# Patient Record
Sex: Male | Born: 1955
Health system: Southern US, Community
[De-identification: ages and names within clinical notes are randomized; demographics above are authoritative.]

## PROBLEM LIST (undated history)

## (undated) DIAGNOSIS — I350 Nonrheumatic aortic (valve) stenosis: Secondary | ICD-10-CM

## (undated) DIAGNOSIS — Z952 Presence of prosthetic heart valve: Secondary | ICD-10-CM

## (undated) DIAGNOSIS — I10 Essential (primary) hypertension: Secondary | ICD-10-CM

## (undated) HISTORY — DX: Nonrheumatic aortic (valve) stenosis: I35.0

## (undated) HISTORY — DX: Essential (primary) hypertension: I10

## (undated) HISTORY — PX: VASECTOMY: SHX75

## (undated) HISTORY — PX: ANAL FISSURE REPAIR: SHX2312

## (undated) HISTORY — PX: OTHER SURGICAL HISTORY: SHX169

---

## 2005-03-16 ENCOUNTER — Ambulatory Visit: Payer: Self-pay | Admitting: Cardiology

## 2009-12-09 LAB — HM COLONOSCOPY

## 2010-05-04 DIAGNOSIS — Z9852 Vasectomy status: Secondary | ICD-10-CM | POA: Insufficient documentation

## 2010-05-04 DIAGNOSIS — K602 Anal fissure, unspecified: Secondary | ICD-10-CM | POA: Insufficient documentation

## 2010-05-04 DIAGNOSIS — I10 Essential (primary) hypertension: Secondary | ICD-10-CM | POA: Insufficient documentation

## 2010-07-14 ENCOUNTER — Encounter: Payer: Self-pay | Admitting: Cardiology

## 2010-07-20 ENCOUNTER — Encounter: Payer: Self-pay | Admitting: Cardiology

## 2010-08-17 ENCOUNTER — Ambulatory Visit: Payer: Self-pay | Admitting: Cardiology

## 2010-08-24 ENCOUNTER — Encounter: Payer: Self-pay | Admitting: Cardiology

## 2010-08-24 ENCOUNTER — Ambulatory Visit (INDEPENDENT_AMBULATORY_CARE_PROVIDER_SITE_OTHER): Payer: 59 | Admitting: Cardiology

## 2010-08-24 DIAGNOSIS — I359 Nonrheumatic aortic valve disorder, unspecified: Secondary | ICD-10-CM

## 2010-08-24 DIAGNOSIS — I350 Nonrheumatic aortic (valve) stenosis: Secondary | ICD-10-CM | POA: Insufficient documentation

## 2010-08-24 DIAGNOSIS — I10 Essential (primary) hypertension: Secondary | ICD-10-CM

## 2010-08-24 MED ORDER — OLMESARTAN-AMLODIPINE-HCTZ 40-5-25 MG PO TABS: 1.0000 | ORAL_TABLET | Freq: Every day | ORAL | Status: DC

## 2010-08-24 NOTE — Assessment & Plan Note (Signed)
He was recently started on a medication list above seems to be working. I will continue this but he is advised to get a blood pressure cuff and to keep a diary.

## 2010-08-24 NOTE — Assessment & Plan Note (Signed)
I suspect that he's had dilatation of his aortic root and now has at least moderate aortic insufficiency by exam. He has bounding pulses and maybe more severe. I'll start with an echocardiogram to further quantify the stenosis/insufficiency in the size of his aortic root.

## 2010-08-24 NOTE — Progress Notes (Signed)
HPI The patient presents for followup of aortic valve disease and hypertension. He has known bicuspid aortic valve disease and was last echoed in 2007. He had a TEE and transthoracic echo in 2003. However, he hasn't been seen by [place] since the last echo.  In fact he brings his wife today. They've been married 31 years. Although he was very aware of this diagnosis he never mentioned it to her. He denies any symptoms. He says he works third shift and he has a vigorous job. He denies any chest pressure, neck or arm discomfort. He has no palpitations, presyncope or syncope. He has no shortness of breath, PND or orthopnea. He has no weight gain or edema.  No Known Allergies  Current Outpatient Prescriptions  Medication Sig Dispense Refill  . Cholecalciferol (VITAMIN D3) 2000 UNITS TABS Take 1 tablet by mouth daily.        . Multiple Vitamins-Minerals (CENTRUM SILVER PO) Take 1 tablet by mouth daily.        . Olmesartan-Amlodipine-HCTZ (TRIBENZOR) 40-5-25 MG TABS Take 1 tablet by mouth daily.  30 tablet  6  . Omega-3 Fatty Acids (FISH OIL CONCENTRATE PO) Take 1 tablet by mouth daily.        . vitamin B-12 (CYANOCOBALAMIN) 1000 MCG tablet Take 1,000 mcg by mouth daily.          Past Medical History  Diagnosis Date  . HTN (hypertension)   . Aortic stenosis     Bicuspid Ao Valve (TEE 2003, TTE 2007)    Past Surgical History  Procedure Date  . Left arm surgery   . Vasectomy   . Anal fissure repair     Family History  Problem Relation Age of Onset  . Cancer Father 54    History   Social History  . Marital Status: Married    Spouse Name: N/A    Number of Children: 2  . Years of Education: N/A   Occupational History  . Mechanic    Social History Main Topics  . Smoking status: Never Smoker   . Smokeless tobacco: Never Used  . Alcohol Use: Not on file  . Drug Use: Not on file  . Sexually Active: Not on file   Other Topics Concern  . Not on file   Social History Narrative  . No  narrative on file    ROS:  Positive for nighttime leg cramps. Otherwise as stated in the HPI and negative for all other systems.   PHYSICAL EXAM BP 148/71  Pulse 80  Ht 5\' 6"  (1.676 m)  Wt 179 lb (81.194 kg)  BMI 28.89 kg/m2 GENERAL:  Well appearing HEENT:  Pupils equal round and reactive, fundi not visualized, oral mucosa unremarkable NECK:  No jugular venous distention, waveform within normal limits, carotid upstroke brisk and symmetric, no bruits, no thyromegaly LYMPHATICS:  No cervical, inguinal adenopathy LUNGS:  Clear to auscultation bilaterally BACK:  No CVA tenderness CHEST:  Unremarkable HEART:  PMI not displaced or sustained,S1 and S2 within normal limits, no S3, no S4, no clicks, no rubs, 2/6 apical systolic murmur mid peaking and out the aortic outflow tract, 3/6 diastolic murmur into and past mid diastole at the 3rd left intercostal space. ABD:  Flat, positive bowel sounds normal in frequency in pitch, no bruits, no rebound, no guarding, no midline pulsatile mass, no hepatomegaly, no splenomegaly EXT:  Pulses are bounding throughout, no edema, no cyanosis no clubbing SKIN:  No rashes no nodules NEURO:  Cranial nerves II  through XII grossly intact, motor grossly intact throughout [facility]:  Cognitively intact, oriented to person place and time   EKG:  Normal sinus rhythm, rate 69, left ventricular hypertrophy by voltage criteria  ASSESSMENT AND PLAN

## 2010-08-24 NOTE — Patient Instructions (Signed)
Your physician wants you to follow-up in: 6 months. You will receive a reminder letter in the mail one-two months in advance. If you don't receive a letter, please call our office to schedule the follow-up appointment. Your physician recommends that you continue on your current medications as directed. Please refer to the Current Medication list given to you today. Your physician has requested that you have an echocardiogram. Echocardiography is a painless test that uses sound waves to create images of your heart. It provides your doctor with information about the size and shape of your heart and how well your heart's chambers and valves are working. This procedure takes approximately one hour. There are no restrictions for this procedure. If the results of your test are normal or stable, you will receive a letter. If they are abnormal, the nurse will contact you by phone.  

## 2010-08-26 ENCOUNTER — Other Ambulatory Visit: Payer: Self-pay | Admitting: *Deleted

## 2010-08-26 ENCOUNTER — Other Ambulatory Visit (INDEPENDENT_AMBULATORY_CARE_PROVIDER_SITE_OTHER): Payer: 59 | Admitting: *Deleted

## 2010-08-26 ENCOUNTER — Other Ambulatory Visit: Payer: Self-pay | Admitting: Cardiology

## 2010-08-26 DIAGNOSIS — I359 Nonrheumatic aortic valve disorder, unspecified: Secondary | ICD-10-CM

## 2010-08-26 DIAGNOSIS — I350 Nonrheumatic aortic (valve) stenosis: Secondary | ICD-10-CM

## 2010-08-26 DIAGNOSIS — R011 Cardiac murmur, unspecified: Secondary | ICD-10-CM

## 2010-08-27 ENCOUNTER — Telehealth: Payer: Self-pay | Admitting: *Deleted

## 2010-08-27 NOTE — Telephone Encounter (Signed)
Notified results are not available at this time.

## 2010-08-31 ENCOUNTER — Telehealth: Payer: Self-pay | Admitting: *Deleted

## 2010-08-31 NOTE — Telephone Encounter (Signed)
Message copied by Arlyss Gandy on Mon Aug 31, 2010 11:20 AM ------      Message from: Rollene Rotunda      Created: Sun Aug 30, 2010 10:02 PM       Please call this patient today.  Tell him that we are still looking at his results.  I want Michelle Piper to look at it again.

## 2010-08-31 NOTE — Telephone Encounter (Signed)
Pt's wife notified.

## 2010-10-05 ENCOUNTER — Other Ambulatory Visit: Payer: Self-pay

## 2011-02-18 ENCOUNTER — Ambulatory Visit (INDEPENDENT_AMBULATORY_CARE_PROVIDER_SITE_OTHER): Payer: 59 | Admitting: Cardiology

## 2011-02-18 ENCOUNTER — Encounter: Payer: Self-pay | Admitting: Cardiology

## 2011-02-18 VITALS — BP 119/70 | HR 73 | Ht 66.0 in | Wt 189.8 lb

## 2011-02-18 DIAGNOSIS — I359 Nonrheumatic aortic valve disorder, unspecified: Secondary | ICD-10-CM

## 2011-02-18 DIAGNOSIS — Q231 Congenital insufficiency of aortic valve: Secondary | ICD-10-CM

## 2011-02-18 DIAGNOSIS — I351 Nonrheumatic aortic (valve) insufficiency: Secondary | ICD-10-CM

## 2011-02-19 ENCOUNTER — Telehealth: Payer: Self-pay | Admitting: *Deleted

## 2011-02-19 ENCOUNTER — Encounter: Payer: Self-pay | Admitting: *Deleted

## 2011-02-19 NOTE — Telephone Encounter (Signed)
Pt has [employer], no precert required

## 2011-02-19 NOTE — Telephone Encounter (Signed)
TEE scheduled for Wednesday, 1/16 at 9:00 - [facility]

## 2011-02-19 NOTE — Telephone Encounter (Signed)
TEE scheduled for Wednesday, 1/16 at 9:00 am.  Patient need to arrive at 8:00 to short stay at [facility].  Reminder:  Nothing to eat or drink after 12 midnight prior to labs.  Instructions given.  Verbalized understanding.

## 2011-02-21 ENCOUNTER — Encounter: Payer: Self-pay | Admitting: Cardiology

## 2011-02-21 DIAGNOSIS — Q231 Congenital insufficiency of aortic valve: Secondary | ICD-10-CM | POA: Insufficient documentation

## 2011-02-21 NOTE — Progress Notes (Signed)
Justin Bottoms, MD, [facility] ABIM Board Certified in Adult Cardiovascular Medicine,Internal Medicine and Critical Care Medicine    CC: followup patient with history of bicuspid aortic valve  HPI:  The patient is a 56 year old man with history of bicuspid aortic valve and aortic insufficiency previously rated at moderate.  He had a recent echocardiogram done in July which showed a possible bicuspid aortic valve with an ejection fraction of 55-60% and only mild mitral regurgitation.  However the quality of the echo was not very good and actually during his office visit I went back and reviewed together with the patient the images He does have aortic regurgitation, but it is very difficult to rate it by transthoracic echocardiogram due to the eccentric nature.  There also appears to be some prolapse of one of the cusps presumably the left coronary cusp of the aortic valve. The patient does not have any evidence of ventricular dilatation.  His ejection fraction also is within normal limits.  He is asymptomatic and isn't in much a class I.  He is very active and works very hard.  He states that last year he worked 333 days out of the year.  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  He denies any chest pain, shortness of breath, orthopnea, PND, presyncope, palpitations or other cardiovascular-related symptoms.  I discussed with him and his wife the findings of his echocardiogram from July 2012   PMH: reviewed and listed in Problem List in Electronic Records (and see below) Past Medical History  Diagnosis Date  . HTN (hypertension)   . Aortic stenosis     Bicuspid Ao Valve (TEE 2003, TTE 2007),moderate aortic insufficiency   Past Surgical History  Procedure Date  . Left arm surgery   . Vasectomy   . Anal fissure repair     Allergies/SH/FHX : available in Electronic Records for review  No Known Allergies History   Social History  . Marital Status: Married   Spouse Name: N/A    Number of Children: 2  . Years of Education: N/A   Occupational History  . Mechanic    Social History Main Topics  . Smoking status: Never Smoker   . Smokeless tobacco: Never Used  . Alcohol Use: Yes     Occasionally--socially  . Drug Use: No  . Sexually Active: Not on file   Other Topics Concern  . Not on file   Social History Narrative  . No narrative on file   Family History  Problem Relation Age of Onset  . Cancer Father 2    Medications: Current Outpatient Prescriptions  Medication Sig Dispense Refill  . Cholecalciferol (VITAMIN D3) 2000 UNITS TABS Take 1 tablet by mouth daily.        . Multiple Vitamins-Minerals (CENTRUM SILVER PO) Take 1 tablet by mouth daily.        . Olmesartan-Amlodipine-HCTZ (TRIBENZOR) 40-5-25 MG TABS Take 1 tablet by mouth daily.  30 tablet  6  . Omega-3 Fatty Acids (FISH OIL CONCENTRATE PO) Take 1 tablet by mouth daily.        Marland Kitchen pyridoxine (B-6) 100 MG tablet Take 100 mg by mouth daily.      . vitamin B-12 (CYANOCOBALAMIN) 1000 MCG tablet Take 1,000 mcg by mouth daily.          ROS: No nausea or vomiting. No fever or chills.No melena or hematochezia.No bleeding.No claudication  Physical Exam: BP 119/70  Pulse 73  Ht 5\' 6"  (1.676 m)  Wt 189 lb 12 oz (86.07 kg)  BMI 30.63 kg/m2 General:well-nourished white male in no apparent distress Neck:normal carotid upstroke no carotid bruits.  No thyromegaly no nodular thyroid.  Normal JVP Lungs:clear breath sounds bilaterally without any wheezing Cardiac:2/6 outflow murmur right upper sternal border and a 3/6 decrescendo murmur ending in late diastole with the patient leaning forward.  Otherwise, normal S1 and S2 Vascular:no edema.  Normal distal pulses.  Normal dorsalis pedis and posterior tibial pulses Skin:warm and dry Physcologic:normal affect  12lead ECG:not performed Limited bedside ECHO:N/A   Patient Active Problem List  Diagnoses  . Hypertension  . Anal  fissure  . Hx of vasectomy  . HTN (hypertension)  . Bicuspid aortic valve- probable moderate aortic insufficiency, NYHA class I.  Asymptomatic    PLAN   I reviewed the patient's echocardiogram at him and his wife.  His physical examination is adequate for portion in severity of aortic insufficiency as noted by physical exam.  The murmur of aortic insufficiency his labs although this does not relate to severity of AI I suspect the murmurs lab because is gently so eccentric.  The murmur and late in diastole suggestive of mild-to-moderate aortic insufficiency.  The patient's last TEE was in 2003, and I told them based on the physical examination and the poor quality of the transthoracic echocardiogram in evaluating his aortic insufficiency.  We will proceed with a TEE.  I discussed the risks and benefits of the procedure the patient as well as his wife.  He is willing to proceed next week.

## 2011-02-24 DIAGNOSIS — I359 Nonrheumatic aortic valve disorder, unspecified: Secondary | ICD-10-CM

## 2011-03-02 ENCOUNTER — Other Ambulatory Visit: Payer: Self-pay | Admitting: *Deleted

## 2011-03-02 DIAGNOSIS — I351 Nonrheumatic aortic (valve) insufficiency: Secondary | ICD-10-CM

## 2011-03-02 DIAGNOSIS — Q231 Congenital insufficiency of aortic valve: Secondary | ICD-10-CM

## 2011-03-03 ENCOUNTER — Other Ambulatory Visit: Payer: Self-pay | Admitting: *Deleted

## 2011-03-03 DIAGNOSIS — I351 Nonrheumatic aortic (valve) insufficiency: Secondary | ICD-10-CM

## 2011-03-06 NOTE — Progress Notes (Signed)
Result TEE [facility]  02/23/2101

## 2011-03-09 DIAGNOSIS — I351 Nonrheumatic aortic (valve) insufficiency: Secondary | ICD-10-CM | POA: Insufficient documentation

## 2011-03-10 ENCOUNTER — Institutional Professional Consult (permissible substitution) (INDEPENDENT_AMBULATORY_CARE_PROVIDER_SITE_OTHER): Payer: 59 | Admitting: Thoracic Surgery (Cardiothoracic Vascular Surgery)

## 2011-03-10 ENCOUNTER — Encounter: Payer: Self-pay | Admitting: Thoracic Surgery (Cardiothoracic Vascular Surgery)

## 2011-03-10 VITALS — BP 111/61 | HR 75 | Resp 20 | Ht 66.0 in | Wt 189.0 lb

## 2011-03-10 DIAGNOSIS — I351 Nonrheumatic aortic (valve) insufficiency: Secondary | ICD-10-CM

## 2011-03-10 DIAGNOSIS — Q231 Congenital insufficiency of aortic valve: Secondary | ICD-10-CM | POA: Insufficient documentation

## 2011-03-10 DIAGNOSIS — I359 Nonrheumatic aortic valve disorder, unspecified: Secondary | ICD-10-CM

## 2011-03-10 NOTE — Progress Notes (Signed)
[address]            [name] [address]          [phone]     CARDIOTHORACIC SURGERY CONSULTATION REPORT  PCP is Monica Becton, MD, MD Referring Provider is Lewayne Bunting, MD  Chief Complaint  Patient presents with  . 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 The patient reports that he was first noted to have a heart murmur many years ago and diagnosed with bicuspid aortic valve and aortic insufficiency approximately 10 years ago. He has been followed with serial echocardiograms since that time.  He recently was seen in followup by Dr. Andee Lineman. Because his previous echocardiogram was poor quality, he scheduled a transesophageal echocardiogram which was performed 2 weeks ago. This revealed severe aortic regurgitation. There was prolapse of aortic valve which appeared to be bicuspid and an eccentric jet of regurgitation that was quite severe. Ejection fraction was estimated 50-55%. There was mild mitral regurgitation. There is mild dilatation of the aortic root. There was mild left ventricular chamber enlargement. The patient has been referred to consider elective aortic valve repair or replacement.  Past Medical History  Diagnosis Date  . HTN (hypertension)   . Aortic stenosis     Bicuspid Ao Valve (TEE 2003, TTE 2007),moderate aortic insufficiency    Past Surgical History  Procedure Date  . Left arm surgery   . Vasectomy   . Anal fissure repair     Family History  Problem Relation Age of Onset  . Cancer Father 61    Social History History  Substance Use Topics  . Smoking status: Never Smoker   . Smokeless tobacco: Never Used  . Alcohol Use: Yes     Occasionally--socially    Current Outpatient Prescriptions  Medication  Sig Dispense Refill  . Cholecalciferol (VITAMIN D3) 2000 UNITS TABS Take 1 tablet by mouth daily.        . Multiple Vitamins-Minerals (CENTRUM SILVER PO) Take 1 tablet by mouth daily.        . Olmesartan-Amlodipine-HCTZ (TRIBENZOR) 40-5-25 MG TABS Take 1 tablet by mouth daily.  30 tablet  6  . Omega-3 Fatty Acids (FISH OIL CONCENTRATE PO) Take 1 tablet by mouth daily.        Marland Kitchen pyridoxine (B-6) 100 MG tablet Take 100 mg by mouth daily.      . vitamin B-12 (CYANOCOBALAMIN) 1000 MCG tablet Take 1,000 mcg by mouth daily.          No Known Allergies  Review of Systems:  General:  normal appetite, normal energy   Respiratory:  no cough, no wheezing, no hemoptysis, no pain with inspiration or cough, no shortness of breath   Cardiac:   no chest pain or tightness, no exertional SOB, no resting SOB, no PND, no orthopnea, no LE edema, no palpitations, no syncope  GI:   no difficulty swallowing, no hematochezia, no hematemesis, no melena, no constipation, no diarrhea   GU:   no dysuria, no urgency, no frequency   Musculoskeletal: no arthritis, no arthralgia other than some pain both ankles with ambulation   Vascular:  no pain suggestive of claudication   Neuro:  no symptoms suggestive of TIA's, no seizures, no headaches, no peripheral neuropathy   Endocrine:  Negative   HEENT:  no loose teeth or painful teeth, sees his dentist regularly,  no recent vision changes  Psych:   no anxiety, no depression    Physical Exam:   BP 111/61  Pulse 75  Resp 20  Ht 5\' 6"  (1.676 m)  Wt 189 lb (85.73 kg)  BMI 30.51 kg/m2  SpO2 95%  General:  well-appearing  HEENT:  Unremarkable   Neck:   no JVD, no bruits, no adenopathy   Chest:   clear to auscultation, symmetrical breath sounds, no wheezes, no rhonchi   CV:   RRR, grade III/VI diastolic murmur   Abdomen:  soft, non-tender, no masses   Extremities:  warm, well-perfused, pulses palpable  Rectal/GU  Deferred  Neuro:   Grossly non-focal and symmetrical  throughout  Skin:   Clean and dry, no rashes, no breakdown   Diagnostic Tests:  Transesophageal ECHO performed 02/24/2011 at [facility] and he is reviewed. This demonstrates what appears to be bicuspid aortic valve with severe prolapse and severe aortic insufficiency. There is mild left ventricular chamber enlargement but left ventricular systolic function is fairly normal with ejection fraction estimated between 50 and 55%. There is mild mitral regurgitation. No other significant abnormalities are noted.   Impression:  Patient has bicuspid native aortic valve with severe aortic insufficiency. There is mild left ventricular chamber enlargement, although chamber dimensions are probably not quite large enough to meet criteria based upon ACC guidelines. There is mild left ventricular dysfunction with ejection fraction 50-55%. The patient remains completely asymptomatic. Because of the severity of aortic insufficiency, I think it is reasonable to consider elective surgical intervention in the near future. Alternatively the patient could continue medical therapy with close followup, but the patient truly has torrential aortic insufficiency documented by TEE.  I share Dr Margarita Mail concern that his left ventricular function might deteriorate fairly quickly without surgical intervention.  Plan:  I discussed matters at length with the patient and his family here in the office today. The rationale for elective surgical intervention has been discussed and compared in contrast with continued medical therapy with close followup. Surgical alternatives have also been discussed in detail including the possibility of replacing his valve using a mechanical prosthesis, a bioprosthetic tissue valve, or possibly the Ross autograft procedure. We discussed the possibility of minimally invasive techniques for conventional aortic valve replacement and contrasted the risks and benefits to median sternotomy. The patient  seems to be interested in proceeding with surgery in the fairly near future. He will need left and right heart catheterization performed. We will also obtain cardiac gated CT angiogram of the aortic valve and aortic root to accurately size the aortic root given the presence of bicuspid valve and further characterize the functional anatomy to ascertain whether or not an attempt at valve repair might be feasible. Overall I am skeptical that his valve could be repaired based upon images from the transesophageal echocardiogram. The patient will return for further followup in 2 weeks.    Salvatore Decent. Cornelius Moras, MD 03/10/2011 6:00 PM

## 2011-03-12 ENCOUNTER — Other Ambulatory Visit: Payer: Self-pay | Admitting: Thoracic Surgery (Cardiothoracic Vascular Surgery)

## 2011-03-15 ENCOUNTER — Other Ambulatory Visit: Payer: Self-pay | Admitting: *Deleted

## 2011-03-15 DIAGNOSIS — I351 Nonrheumatic aortic (valve) insufficiency: Secondary | ICD-10-CM

## 2011-03-16 ENCOUNTER — Other Ambulatory Visit: Payer: Self-pay | Admitting: *Deleted

## 2011-03-16 DIAGNOSIS — Z01810 Encounter for preprocedural cardiovascular examination: Secondary | ICD-10-CM

## 2011-03-16 DIAGNOSIS — I351 Nonrheumatic aortic (valve) insufficiency: Secondary | ICD-10-CM

## 2011-03-17 ENCOUNTER — Other Ambulatory Visit: Payer: Self-pay | Admitting: Cardiology

## 2011-03-17 ENCOUNTER — Encounter: Payer: Self-pay | Admitting: *Deleted

## 2011-03-17 DIAGNOSIS — I351 Nonrheumatic aortic (valve) insufficiency: Secondary | ICD-10-CM

## 2011-03-17 LAB — PROTIME-INR

## 2011-03-19 ENCOUNTER — Encounter: Payer: Self-pay | Admitting: *Deleted

## 2011-03-22 ENCOUNTER — Ambulatory Visit (HOSPITAL_COMMUNITY)
Admission: RE | Admit: 2011-03-22 | Discharge: 2011-03-22 | Disposition: A | Discharge status: Home / Self Care | Payer: 59 | Source: Ambulatory Visit | Attending: Cardiology | Admitting: Cardiology

## 2011-03-22 ENCOUNTER — Ambulatory Visit (HOSPITAL_COMMUNITY): Admission: RE | Admit: 2011-03-22 | Payer: 59 | Source: Ambulatory Visit

## 2011-03-22 ENCOUNTER — Encounter: Payer: Self-pay | Admitting: Cardiology

## 2011-03-22 ENCOUNTER — Other Ambulatory Visit: Payer: Self-pay | Admitting: Cardiology

## 2011-03-22 ENCOUNTER — Encounter: Payer: Self-pay | Admitting: *Deleted

## 2011-03-22 DIAGNOSIS — I351 Nonrheumatic aortic (valve) insufficiency: Secondary | ICD-10-CM

## 2011-03-22 NOTE — H&P (Signed)
TEE report [facility] [address] [name]                     Justin Gasner De Gent, MD  02/21/2011  3:55 PM  Signed    Justin Gowens De Gent, MD, FACC ABIM Board Certified in Adult Cardiovascular Medicine,Internal Medicine and Critical Care Medicine      CC: followup patient with history of bicuspid aortic valve   HPI:   The patient is a 55-year-old man with history of bicuspid aortic valve and aortic insufficiency previously rated at moderate.  He had a recent echocardiogram done in July which showed a possible bicuspid aortic valve with an ejection fraction of 55-60% and only mild mitral regurgitation.  However the quality of the echo was not very good and actually during his office visit I went back and reviewed together with the patient the images He does have aortic regurgitation, but it is very difficult to rate it by transthoracic echocardiogram due to the eccentric nature.  There also appears to be some prolapse of one of the cusps presumably the left coronary cusp of the aortic valve. The patient does not have any evidence of ventricular dilatation.  His ejection fraction also is within normal limits.  He is asymptomatic and isn't in much a class I.  He is very active and works very hard.  He states that last year he worked 333 days out of the year.  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  He denies any chest pain, shortness of breath, orthopnea, PND, presyncope, palpitations or other cardiovascular-related symptoms.  I discussed with him and his wife the findings of his echocardiogram from July 2012     PMH: reviewed and listed in Problem List in Electronic Records (and see below) Past Medical History   Diagnosis  Date   .  HTN (hypertension)     .  Aortic stenosis         Bicuspid Ao Valve (TEE 2003, TTE 2007),moderate aortic insufficiency    Past Surgical History   Procedure  Date   .  Left arm surgery     .  Vasectomy     .  Anal fissure repair         Allergies/SH/FHX : available in Electronic Records for review   No Known Allergies History       Social History   .  Marital Status:  Married       Spouse Name:  N/A       Number of Children:  2   .  Years of Education:  N/A       Occupational History   .  Mechanic         Social History Main Topics   .  Smoking status:  Never Smoker    .  Smokeless tobacco:  Never Used   .  Alcohol Use:  Yes         Occasionally--socially   .  Drug Use:  No   .  Sexually Active:  Not on file       Other Topics  Concern   .  Not on file       Social History Narrative   .  No narrative on file    Family History   Problem  Relation  Age of Onset   .  Cancer  Father  74      Medications: Current Outpatient Prescriptions     Medication  Sig  Dispense  Refill   .  Cholecalciferol (VITAMIN D3) 2000 UNITS TABS  Take 1 tablet by mouth daily.           .  Multiple Vitamins-Minerals (CENTRUM SILVER PO)  Take 1 tablet by mouth daily.           .  Olmesartan-Amlodipine-HCTZ (TRIBENZOR) 40-5-25 MG TABS  Take 1 tablet by mouth daily.   30 tablet   6   .  Omega-3 Fatty Acids (FISH OIL CONCENTRATE PO)  Take 1 tablet by mouth daily.           .  pyridoxine (B-6) 100 MG tablet  Take 100 mg by mouth daily.         .  vitamin B-12 (CYANOCOBALAMIN) 1000 MCG tablet  Take 1,000 mcg by mouth daily.              ROS: No nausea or vomiting. No fever or chills.No melena or hematochezia.No bleeding.No claudication   Physical Exam: BP 119/70  Pulse 73  Ht 5' 6" (1.676 m)  Wt 189 lb 12 oz (86.07 kg)  BMI 30.63 kg/m2 General:well-nourished white male in no apparent distress Neck:normal carotid upstroke no carotid bruits.  No thyromegaly no nodular thyroid.  Normal JVP Lungs:clear breath sounds bilaterally without any wheezing Cardiac:2/6 outflow murmur right upper sternal border and a 3/6 decrescendo murmur ending in late diastole with the patient leaning forward.  Otherwise, normal S1 and  S2 Vascular:no edema.  Normal distal pulses.  Normal dorsalis pedis and posterior tibial pulses Skin:warm and dry Physcologic:normal affect   12lead ECG:not performed Limited bedside ECHO:N/A      Patient Active Problem List   Diagnoses   .  Hypertension   .  Anal fissure   .  Hx of vasectomy   .  HTN (hypertension)   .  Bicuspid aortic valve- probable moderate aortic insufficiency, NYHA class I.  Asymptomatic        PLAN    I reviewed the patient's echocardiogram at him and his wife.  His physical examination is adequate for portion in severity of aortic insufficiency as noted by physical exam.  The murmur of aortic insufficiency his labs although this does not relate to severity of AI I suspect the murmurs lab because is gently so eccentric.  The murmur and late in diastole suggestive of mild-to-moderate aortic insufficiency. The patient's last TEE was in 2003, and I told them based on the physical examination and the poor quality of the transthoracic echocardiogram in evaluating his aortic insufficiency.  We will proceed with a TEE. I discussed the risks and benefits of the procedure the patient as well as his wife.  He is willing to proceed next week.   Justin Unrein De Gent, MD  03/06/2011  2:28 PM  Signed  10:56 AM [name] 03/22/2011   

## 2011-03-25 ENCOUNTER — Encounter (HOSPITAL_BASED_OUTPATIENT_CLINIC_OR_DEPARTMENT_OTHER)
Admission: RE | Disposition: A | Discharge status: Home / Self Care | Payer: Self-pay | Source: Ambulatory Visit | Attending: Cardiology

## 2011-03-25 ENCOUNTER — Other Ambulatory Visit: Payer: Self-pay | Admitting: Cardiology

## 2011-03-25 ENCOUNTER — Inpatient Hospital Stay (HOSPITAL_BASED_OUTPATIENT_CLINIC_OR_DEPARTMENT_OTHER)
Admission: RE | Admit: 2011-03-25 | Discharge: 2011-03-25 | Disposition: A | Discharge status: Home / Self Care | Payer: 59 | Source: Ambulatory Visit | Attending: Cardiology | Admitting: Cardiology

## 2011-03-25 DIAGNOSIS — Q2381 Bicuspid aortic valve: Secondary | ICD-10-CM

## 2011-03-25 DIAGNOSIS — I2510 Atherosclerotic heart disease of native coronary artery without angina pectoris: Secondary | ICD-10-CM | POA: Insufficient documentation

## 2011-03-25 DIAGNOSIS — I739 Peripheral vascular disease, unspecified: Secondary | ICD-10-CM

## 2011-03-25 DIAGNOSIS — I351 Nonrheumatic aortic (valve) insufficiency: Secondary | ICD-10-CM

## 2011-03-25 DIAGNOSIS — I359 Nonrheumatic aortic valve disorder, unspecified: Secondary | ICD-10-CM

## 2011-03-25 DIAGNOSIS — Q231 Congenital insufficiency of aortic valve: Secondary | ICD-10-CM

## 2011-03-25 LAB — POCT I-STAT 3, ART BLOOD GAS (G3+)
Acid-base deficit: 2 mmol/L (ref 0.0–2.0)
Bicarbonate: 24 mEq/L (ref 20.0–24.0)
pH, Arterial: 7.346 — ABNORMAL LOW (ref 7.350–7.450)

## 2011-03-25 LAB — POCT I-STAT 3, VENOUS BLOOD GAS (G3P V)
Acid-base deficit: 3 mmol/L — ABNORMAL HIGH (ref 0.0–2.0)
O2 Saturation: 57 %
pCO2, Ven: 43.4 mmHg — ABNORMAL LOW (ref 45.0–50.0)

## 2011-03-25 SURGERY — JV LEFT HEART CATHETERIZATION WITH CORONARY ANGIOGRAM
Anesthesia: Moderate Sedation | Laterality: Left

## 2011-03-25 MED ORDER — ASPIRIN 81 MG PO CHEW
324.0000 mg | CHEWABLE_TABLET | ORAL | Status: AC
  Administered 2011-03-25: 324 mg via ORAL

## 2011-03-25 MED ORDER — SODIUM CHLORIDE 0.9 % IJ SOLN: 3.0000 mL | Freq: Two times a day (BID) | INTRAMUSCULAR | Status: DC

## 2011-03-25 MED ORDER — SODIUM CHLORIDE 0.9 % IJ SOLN: 3.0000 mL | INTRAMUSCULAR | Status: DC | PRN

## 2011-03-25 MED ORDER — SODIUM CHLORIDE 0.9 % IV SOLN: 250.0000 mL | INTRAVENOUS | Status: DC | PRN

## 2011-03-25 MED ORDER — SODIUM CHLORIDE 0.9 % IV SOLN: INTRAVENOUS | Status: DC

## 2011-03-25 MED ORDER — ACETAMINOPHEN 325 MG PO TABS: 650.0000 mg | ORAL_TABLET | ORAL | Status: DC | PRN

## 2011-03-25 MED ORDER — DIAZEPAM 5 MG PO TABS
5.0000 mg | ORAL_TABLET | ORAL | Status: AC
  Administered 2011-03-25: 5 mg via ORAL

## 2011-03-25 MED ORDER — ONDANSETRON HCL 4 MG/2ML IJ SOLN: 4.0000 mg | Freq: Four times a day (QID) | INTRAMUSCULAR | Status: DC | PRN

## 2011-03-25 NOTE — OR Nursing (Signed)
Meal served 

## 2011-03-25 NOTE — Procedures (Signed)
  Cardiac Catheterization Procedure Note  Name: Justin [name] MRN: [id number] DOB: 1955-06-01  Procedure: Right Heart Cath, Left Heart Cath, Selective Coronary Angiography, LV angiography, AO root, Distal AO  Indication:    Procedural Details: The right groin was prepped, draped, and anesthetized with 1% lidocaine. Using the modified Seldinger technique a 4 French sheath was placed in the left femoral artery and a 7 French sheath was placed in the left femoral vein. A Swan-Ganz catheter was used for the right heart catheterization. Standard protocol was followed for recording of right heart pressures and sampling of oxygen saturations. Fick cardiac output was calculated. Standard Judkins catheters were used for selective coronary angiography and left ventriculography. There were no immediate procedural complications. The patient was transferred to the post catheterization recovery area for further monitoring.  Procedural Findings: Hemodynamics:               RA 3    RV 22/6    PA mean 12    PCWP 7    LV 90/12    AO 94/65   Oxygen saturations:    PA 94    AO 57   Cardiac Output (Fick) 3.6                            Cardiac Index (Fick) 1.9   Coronary angiography:  Coronary dominance: right  Left mainstem: Normal  Left anterior descending (LAD): Wraps the apex.  Normal.  Mid diagonal large and normal.  Left circumflex (LCx): AV groove normal.  RI moderate sized normal.  MOM large and normal.  PL small to moderate sized and normal.  Right coronary artery (RCA): Mid to distal long 50 - 60% diffuse narrowing.  Very small PDA and PL distal to this.  Left ventriculography: Left ventricular systolic function is normal, LVEF is estimated at 65%.  Ventricular ectopy precludes adequate analysis.  AO root:  There appears to be mild dilatation.  There is severe 4+ AI.    AO distal:  Normal  Final Conclusions:  Severe AI with moderate RCA stenosis.  Preserved EF.  Recommendations:  The patient has follow up with Dr. Cornelius Moras and a planned CT to further plan aortic valve surgery.   Rollene Rotunda 03/25/2011, 9:15 AM

## 2011-03-25 NOTE — OR Nursing (Signed)
Discharge instructions reviewed and signed, pt stated understanding, ambulated in hall without difficulty, site level 0, transported to wife's car via wheelchair 

## 2011-03-25 NOTE — OR Nursing (Signed)
Tegaderm dressing applied, site level 0, bedrest begins at 0915 

## 2011-03-25 NOTE — Interval H&P Note (Signed)
History and Physical Interval Note:  03/25/2011 8:15 AM  Justin [name]  has presented today for surgery, with the diagnosis of AI CASE  The various methods of treatment have been discussed with the patient and family. After consideration of risks, benefits and other options for treatment, the patient has consented to  Procedure(s) (LRB): JV LEFT HEART CATHETERIZATION WITH CORONARY ANGIOGRAM and right heart cath (Left) as a surgical intervention .  The patients' history has been reviewed, patient examined, no change in status, stable for surgery.  I have reviewed the patients' chart and labs.  Questions were answered to the patient's satisfaction.     Rollene Rotunda

## 2011-03-25 NOTE — OR Nursing (Signed)
Dr Hochrein at bedside to discuss results and treatment plan with pt and family 

## 2011-03-25 NOTE — H&P (View-Only) (Signed)
TEE report [facility] Dr Baird Kay, MD  02/21/2011  3:55 PM  Signed    Peyton Bottoms, MD, [facility] ABIM Board Certified in Adult Cardiovascular Medicine,Internal Medicine and Critical Care Medicine      CC: followup patient with history of bicuspid aortic valve   HPI:   The patient is a 56 year old man with history of bicuspid aortic valve and aortic insufficiency previously rated at moderate.  He had a recent echocardiogram done in July which showed a possible bicuspid aortic valve with an ejection fraction of 55-60% and only mild mitral regurgitation.  However the quality of the echo was not very good and actually during his office visit I went back and reviewed together with the patient the images He does have aortic regurgitation, but it is very difficult to rate it by transthoracic echocardiogram due to the eccentric nature.  There also appears to be some prolapse of one of the cusps presumably the left coronary cusp of the aortic valve. The patient does not have any evidence of ventricular dilatation.  His ejection fraction also is within normal limits.  He is asymptomatic and isn't in much a class I.  He is very active and works very hard.  He states that last year he worked 333 days out of the year.  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  He denies any chest pain, shortness of breath, orthopnea, PND, presyncope, palpitations or other cardiovascular-related symptoms.  I discussed with him and his wife the findings of his echocardiogram from July 2012     PMH: reviewed and listed in Problem List in Electronic Records (and see below) Past Medical History   Diagnosis  Date   .  HTN (hypertension)     .  Aortic stenosis         Bicuspid Ao Valve (TEE 2003, TTE 2007),moderate aortic insufficiency    Past Surgical History   Procedure  Date   .  Left arm surgery     .  Vasectomy     .  Anal fissure repair         Allergies/SH/FHX : available in Electronic Records for review   No Known Allergies History       Social History   .  Marital Status:  Married       Spouse Name:  N/A       Number of Children:  2   .  Years of Education:  N/A       Occupational History   .  Mechanic         Social History Main Topics   .  Smoking status:  Never Smoker    .  Smokeless tobacco:  Never Used   .  Alcohol Use:  Yes         Occasionally--socially   .  Drug Use:  No   .  Sexually Active:  Not on file       Other Topics  Concern   .  Not on file       Social History Narrative   .  No narrative on file    Family History   Problem  Relation  Age of Onset   .  Cancer  Father  42      Medications: Current Outpatient Prescriptions  Medication  Sig  Dispense  Refill   .  Cholecalciferol (VITAMIN D3) 2000 UNITS TABS  Take 1 tablet by mouth daily.           .  Multiple Vitamins-Minerals (CENTRUM SILVER PO)  Take 1 tablet by mouth daily.           .  Olmesartan-Amlodipine-HCTZ (TRIBENZOR) 40-5-25 MG TABS  Take 1 tablet by mouth daily.   30 tablet   6   .  Omega-3 Fatty Acids (FISH OIL CONCENTRATE PO)  Take 1 tablet by mouth daily.           Marland Kitchen  pyridoxine (B-6) 100 MG tablet  Take 100 mg by mouth daily.         .  vitamin B-12 (CYANOCOBALAMIN) 1000 MCG tablet  Take 1,000 mcg by mouth daily.              ROS: No nausea or vomiting. No fever or chills.No melena or hematochezia.No bleeding.No claudication   Physical Exam: BP 119/70  Pulse 73  Ht 5\' 6"  (1.676 m)  Wt 189 lb 12 oz (86.07 kg)  BMI 30.63 kg/m2 General:well-nourished white male in no apparent distress Neck:normal carotid upstroke no carotid bruits.  No thyromegaly no nodular thyroid.  Normal JVP Lungs:clear breath sounds bilaterally without any wheezing Cardiac:2/6 outflow murmur right upper sternal border and a 3/6 decrescendo murmur ending in late diastole with the patient leaning forward.  Otherwise, normal S1 and  S2 Vascular:no edema.  Normal distal pulses.  Normal dorsalis pedis and posterior tibial pulses Skin:warm and dry Physcologic:normal affect   12lead ECG:not performed Limited bedside ECHO:N/A      Patient Active Problem List   Diagnoses   .  Hypertension   .  Anal fissure   .  Hx of vasectomy   .  HTN (hypertension)   .  Bicuspid aortic valve- probable moderate aortic insufficiency, NYHA class I.  Asymptomatic        PLAN    I reviewed the patient's echocardiogram at him and his wife.  His physical examination is adequate for portion in severity of aortic insufficiency as noted by physical exam.  The murmur of aortic insufficiency his labs although this does not relate to severity of AI I suspect the murmurs lab because is gently so eccentric.  The murmur and late in diastole suggestive of mild-to-moderate aortic insufficiency. The patient's last TEE was in 2003, and I told them based on the physical examination and the poor quality of the transthoracic echocardiogram in evaluating his aortic insufficiency.  We will proceed with a TEE. I discussed the risks and benefits of the procedure the patient as well as his wife.  He is willing to proceed next week.   Peyton Bottoms, MD  03/06/2011  2:28 PM  Signed  10:56 AM Alvin Critchley Natasha Bence 03/22/2011

## 2011-03-29 ENCOUNTER — Ambulatory Visit: Payer: 59 | Admitting: Thoracic Surgery (Cardiothoracic Vascular Surgery)

## 2011-03-29 ENCOUNTER — Other Ambulatory Visit (HOSPITAL_COMMUNITY): Payer: 59

## 2011-03-29 ENCOUNTER — Ambulatory Visit (HOSPITAL_COMMUNITY)
Admission: RE | Admit: 2011-03-29 | Discharge: 2011-03-29 | Disposition: A | Discharge status: Home / Self Care | Payer: 59 | Source: Ambulatory Visit | Attending: Cardiology | Admitting: Cardiology

## 2011-03-29 DIAGNOSIS — Q231 Congenital insufficiency of aortic valve: Secondary | ICD-10-CM | POA: Insufficient documentation

## 2011-03-29 DIAGNOSIS — Q2381 Bicuspid aortic valve: Secondary | ICD-10-CM

## 2011-03-29 DIAGNOSIS — I359 Nonrheumatic aortic valve disorder, unspecified: Secondary | ICD-10-CM | POA: Insufficient documentation

## 2011-03-29 MED ORDER — IOHEXOL 350 MG/ML SOLN
100.0000 mL | Freq: Once | INTRAVENOUS | Status: AC | PRN
  Administered 2011-03-29: 100 mL via INTRAVENOUS

## 2011-03-29 MED ORDER — METOPROLOL TARTRATE 100 MG PO TABS
100.0000 mg | ORAL_TABLET | ORAL | Status: AC
  Administered 2011-03-29: 100 mg via ORAL
  Filled 2011-03-29: qty 1

## 2011-03-29 MED ORDER — NITROGLYCERIN 0.4 MG SL SUBL
SUBLINGUAL_TABLET | SUBLINGUAL | Status: AC
  Administered 2011-03-29: 0.4 mg via SUBLINGUAL
  Filled 2011-03-29: qty 25

## 2011-03-29 NOTE — Progress Notes (Addendum)
Patient arrived, placed on monitor. In normal sinus rhythm ... See VS. Metoprolol po ordered by Dr. Vinnie Level (Dr. Rito Ehrlich). Patient not on erectile dysfunction meds. No history of diabetes or kidney disease. Does have hypertension. No labs needed as per [facility] in [place].  NPO since 0730 am. Non smoker. Had a Heart cath last week, Thursday. This test is still wanted. Patient has problem with aortic valve. On Tribenzor 40-5-25, fish oil, B6, D3, Centrum silver, B12.

## 2011-04-02 ENCOUNTER — Ambulatory Visit (INDEPENDENT_AMBULATORY_CARE_PROVIDER_SITE_OTHER): Payer: 59 | Admitting: Thoracic Surgery (Cardiothoracic Vascular Surgery)

## 2011-04-02 ENCOUNTER — Encounter: Payer: Self-pay | Admitting: Thoracic Surgery (Cardiothoracic Vascular Surgery)

## 2011-04-02 ENCOUNTER — Other Ambulatory Visit: Payer: Self-pay | Admitting: Thoracic Surgery (Cardiothoracic Vascular Surgery)

## 2011-04-02 VITALS — BP 137/70 | HR 88 | Resp 20 | Ht 68.0 in | Wt 191.0 lb

## 2011-04-02 DIAGNOSIS — I359 Nonrheumatic aortic valve disorder, unspecified: Secondary | ICD-10-CM

## 2011-04-02 DIAGNOSIS — I351 Nonrheumatic aortic (valve) insufficiency: Secondary | ICD-10-CM

## 2011-04-02 DIAGNOSIS — I059 Rheumatic mitral valve disease, unspecified: Secondary | ICD-10-CM

## 2011-04-02 DIAGNOSIS — Q231 Congenital insufficiency of aortic valve: Secondary | ICD-10-CM

## 2011-04-02 NOTE — Progress Notes (Signed)
[address]            [place] [address]          [phone]     CARDIOTHORACIC SURGERY OFFICE NOTE  Referring Provider is de Karma Lew, MD PCP is Rudi Heap, MD, MD   HPI:  Patient returns for follow-up of aortic insufficiency. He recently underwent catheterization as well as cardiac gated CT angiogram. He returns to the office today to discuss the results and further discuss possible elective aortic valve surgery. He reports no new problems or complaints.   Current Outpatient Prescriptions  Medication Sig Dispense Refill  . Cholecalciferol (VITAMIN D3) 2000 UNITS TABS Take 1 tablet by mouth daily.        . Multiple Vitamins-Minerals (CENTRUM SILVER PO) Take 1 tablet by mouth daily.        . Olmesartan-Amlodipine-HCTZ (TRIBENZOR) 40-5-25 MG TABS Take 1 tablet by mouth daily.  30 tablet  6  . Omega-3 Fatty Acids (FISH OIL CONCENTRATE PO) Take 1 tablet by mouth daily.        Marland Kitchen pyridoxine (B-6) 100 MG tablet Take 100 mg by mouth daily.      . vitamin B-12 (CYANOCOBALAMIN) 1000 MCG tablet Take 1,000 mcg by mouth daily.            Physical Exam:   BP 137/70  Pulse 88  Resp 20  Ht 5\' 8"  (1.727 m)  Wt 191 lb (86.637 kg)  BMI 29.04 kg/m2  SpO2 97%  General:  Well appearing  Chest:   clear  CV:   RRR with diastolic murmur  Abdomen:  soft  Extremities:  warm    Diagnostic Tests:  Cardiac Catheterization Procedure Note  Name: NAJEEB UPTAIN  MRN: [id number]  DOB: 1955/09/18  Procedure: Right Heart Cath, Left Heart Cath, Selective Coronary Angiography, LV angiography, AO root, Distal AO  Indication:  Procedural Details: The right groin was prepped, draped, and anesthetized with 1% lidocaine. Using the modified Seldinger technique a 4 French sheath was placed in the left femoral artery and a 7 French sheath was placed in the left femoral vein. A Swan-Ganz catheter was used for the right heart catheterization. Standard protocol was  followed for recording of right heart pressures and sampling of oxygen saturations. Fick cardiac output was calculated. Standard Judkins catheters were used for selective coronary angiography and left ventriculography. There were no immediate procedural complications. The patient was transferred to the post catheterization recovery area for further monitoring.  Procedural Findings:  Hemodynamics:  RA 3  RV 22/6  PA mean 12  PCWP 7  LV 90/12  AO 94/65  Oxygen saturations:  PA 94  AO 57  Cardiac Output (Fick) 3.6 Cardiac Index (Fick) 1.9  Coronary angiography:  Coronary dominance: right  Left mainstem: Normal  Left anterior descending (LAD): Wraps the apex. Normal. Mid diagonal large and normal.  Left circumflex (LCx): AV groove normal. RI moderate sized normal. MOM large and normal. PL small to moderate sized and normal.  Right coronary artery (RCA): Mid to distal long 50 - 60% diffuse narrowing. Very small PDA and PL distal to this.  Left ventriculography: Left ventricular systolic function is normal, LVEF is estimated at 65%. Ventricular ectopy precludes adequate analysis.  AO root: There appears to be mild dilatation. There is severe 4+ AI.  AO distal: Normal  Final Conclusions: Severe AI with moderate  RCA stenosis. Preserved EF. Recommendations: The patient has follow up with Dr. Cornelius Moras and a planned CT to further plan aortic valve surgery.  Rollene Rotunda  03/25/2011, 9:15 AM      CT ANGIOGRAPHY OF THE HEART  FINDINGS:  Technical quality: Excellent  CORONARY ARTERIES:  Left main coronary artery: Negative.  Left anterior descending: Negative.  Ramus intermedius: Negative.  Left circumflex: Negative.  Right coronary artery: Negative.  Posterior descending artery: Patent.  Dominance: Codominant.  CORONARY CALCIUM:  Total Agatston Score: 0  MESA database percentile: N/A  CARDIAC FUNCTION:  Ejection fraction: 67%  End-diastolic volume: 161 ml  End-systolic volume: 096 ml    Stroke volume: 262 ml  Wall motion analysis: No regional wall motion abnormalities.  CARDIAC MEASUREMENTS:  Interventricular septum (6 - 12 mm): 19 mm  LV posterior wall (6 - 12 mm): 12 mm  LV diameter in diastole (35 - 52 mm): 62 mm  LV diameter at end systole (21 - 40 mm): 43 mm  LA diameter at end systole (19 - 40 mm): 40 mm  AORTIC ROOT:  Aortic Valve Description: There are three thickened cusp moieties  and two commissures with an intervening median raphe very between  the left and right cusp moieties, consistent with a type 1 bicuspid  aortic valve (i.e., functionally bicuspid). Some calcification of  the fused cusp is noted (the left cusp moiety). There is  significant prolapse of the fused cusp moiety during diastole, with  a regurgitant orifice area of approximately 89 mm sq estimated by  planimetry, indicative of severe aortic insufficiency. During  systole, there is a normal aortic valve area (estimated at 5.3 cm  sq estimated by planimetry).  Aortic Valve Area: 5.3 cm-sq  Regurgitant Orifice Area: 0.89 cm-sq  Aortic Annulus (systolic measurents):  Long-axis: 39 mm  Short-axis: 32 mm  Cross-sectional area: 9.62 cm-sq  Circumference: 123 mm  Sinuses of Valsalva (diastolic measurements:  L-SOV - Width: 41 mm  Height: 24 mm  R-SOV - Width: 37 mm  Height: 27 mm  De Beque-SOV - Width: 42 mm  Height: 37 mm  AORTA AND PULMONARY MEASUREMENTS:  Ascending aorta ( < 40 mm): 37 mm  Descending aorta ( < 40 mm): 29 mm  Main pulmonary artery: ( < 30 mm): 25 mm  EXTRACARDIAC FINDINGS:  Visualized lung parenchyma demonstrates no suspicious appearing  pulmonary nodules or masses. No consolidative airspace disease.  No visualized pleural effusions. Visualized portions of the upper  abdomen are unremarkable. There are no aggressive appearing lytic  or blastic lesions noted in the visualized portions of the  skeleton.  IMPRESSION:  1. Type 1 bicuspid aortic valve with fusion of the  left and right  cusp moieties. There is thickening and calcification of the valve  cusps, as above, and prolapse of the fused cusp resulting in a  larger regurgitant and orifice area (0.89 cm-sq), consistent with  severe aortic insufficiency.  2. Aortic root measurements, as above.  3. Left ventricular dilatation and both end diastole and end  systole, as above, without regional wall motion abnormalities.  4. Basal septal hypertrophy.  5. No evidence of significant coronary artery disease. The  patient's total coronary artery calcium score is zero.  6. Codominance of the coronary arteries.  Original Report Authenticated By: Florencia Reasons, M.D.    Impression:  Patient has type I bicuspid aortic valve with prolapse of the fused left and right cusps causing severe (4+) aortic insufficiency. There is no aneurysmal enlargement  of the aortic root nor a setting thoracic aorta. There is no significant coronary artery disease. I'm not at all impressed by the mild narrowing of the right coronary artery seen on catheterization and I do not feel that concomitant coronary artery bypass grafting would be necessary. Based on the images from cardiac CT I feel there is a chance that the patient's valve might be repairable. I would not recommend attempting to repair the valve through the mini thoracotomy approach do to limited exposure and the potential for increased duration of surgery.  Alternatives include continued medical therapy with very close followup versus elective surgery at this time. If the patient desires for his valve to be replaced using a mechanical prosthesis to avoid any potential for the need for repeat surgery in the future, then I feel he would be an excellent candidate for minimally invasive approach for surgery. Alternatively, if the patient hopes to avoid Coumadin it would be reasonable to consider aortic valve repair with plans to replace his valve using a bioprosthetic tissue valve if  his valve cannot be satisfactorily repaired at the time of surgery. I would recommend this be performed through a conventional sternotomy.   Plan:  I've discussed matters at length with Mr. Crabbe and his wife in the office today. He is eager to proceed with surgery in the near future. He does not wish to take Coumadin and he specifically requests that his valve be replaced using a bioprosthetic tissue valve if repair is not feasible. He understands the rationale for valve repair as well as the potential for late structural valve deterioration in failure requiring repeat surgery.  The patient understands and accepts all potential associated risks of surgery including but not limited to risk of death, stroke, myocardial infarction, congestive heart failure, respiratory failure, renal failure, bleeding requiring blood transfusion and/or reexploration, arrhythmia, heart block or bradycardia requiring permanent pacemaker, pneumonia, pleural effusion, wound infection, pulmonary embolus or other thromboembolic complication, chronic pain or other delayed complications.  All questions answered.  We plan to proceed with surgery on Friday, March 8.     Salvatore Decent. Cornelius Moras, MD 04/02/2011 2:56 PM

## 2011-04-05 ENCOUNTER — Encounter (HOSPITAL_COMMUNITY): Payer: Self-pay | Admitting: Pharmacy Technician

## 2011-04-05 ENCOUNTER — Other Ambulatory Visit: Payer: Self-pay

## 2011-04-05 DIAGNOSIS — I359 Nonrheumatic aortic valve disorder, unspecified: Secondary | ICD-10-CM

## 2011-04-14 ENCOUNTER — Ambulatory Visit (HOSPITAL_COMMUNITY)
Admission: RE | Admit: 2011-04-14 | Discharge: 2011-04-14 | Disposition: A | Discharge status: Home / Self Care | Payer: 59 | Source: Ambulatory Visit | Attending: Thoracic Surgery (Cardiothoracic Vascular Surgery) | Admitting: Thoracic Surgery (Cardiothoracic Vascular Surgery)

## 2011-04-14 ENCOUNTER — Encounter (HOSPITAL_COMMUNITY)
Admission: RE | Admit: 2011-04-14 | Discharge: 2011-04-14 | Disposition: A | Discharge status: Home / Self Care | Payer: 59 | Source: Ambulatory Visit | Attending: Thoracic Surgery (Cardiothoracic Vascular Surgery) | Admitting: Thoracic Surgery (Cardiothoracic Vascular Surgery)

## 2011-04-14 ENCOUNTER — Other Ambulatory Visit: Payer: Self-pay

## 2011-04-14 DIAGNOSIS — Z01818 Encounter for other preprocedural examination: Secondary | ICD-10-CM | POA: Insufficient documentation

## 2011-04-14 DIAGNOSIS — Z01812 Encounter for preprocedural laboratory examination: Secondary | ICD-10-CM | POA: Insufficient documentation

## 2011-04-14 DIAGNOSIS — I359 Nonrheumatic aortic valve disorder, unspecified: Secondary | ICD-10-CM | POA: Insufficient documentation

## 2011-04-14 DIAGNOSIS — Z01810 Encounter for preprocedural cardiovascular examination: Secondary | ICD-10-CM | POA: Insufficient documentation

## 2011-04-14 LAB — URINALYSIS, ROUTINE W REFLEX MICROSCOPIC
Bilirubin Urine: NEGATIVE
Ketones, ur: NEGATIVE mg/dL
Nitrite: NEGATIVE
Protein, ur: NEGATIVE mg/dL
Specific Gravity, Urine: 1.018 (ref 1.005–1.030)
Urobilinogen, UA: 0.2 mg/dL (ref 0.0–1.0)

## 2011-04-14 LAB — PROTIME-INR
INR: 1.07 (ref 0.00–1.49)
Prothrombin Time: 14.1 seconds (ref 11.6–15.2)

## 2011-04-14 LAB — COMPREHENSIVE METABOLIC PANEL
ALT: 18 U/L (ref 0–53)
AST: 23 U/L (ref 0–37)
CO2: 22 mEq/L (ref 19–32)
Calcium: 9.3 mg/dL (ref 8.4–10.5)
GFR calc non Af Amer: 77 mL/min — ABNORMAL LOW (ref >90)
Sodium: 137 mEq/L (ref 135–145)

## 2011-04-14 LAB — SURGICAL PCR SCREEN
MRSA, PCR: NEGATIVE
Staphylococcus aureus: NEGATIVE

## 2011-04-14 LAB — BLOOD GAS, ARTERIAL
Bicarbonate: 24.5 mEq/L — ABNORMAL HIGH (ref 20.0–24.0)
TCO2: 25.7 mmol/L (ref 0–100)
pCO2 arterial: 38.6 mmHg (ref 35.0–45.0)
pH, Arterial: 7.419 (ref 7.350–7.450)

## 2011-04-14 LAB — ABO/RH: ABO/RH(D): A NEG

## 2011-04-14 LAB — PULMONARY FUNCTION TEST

## 2011-04-14 MED ORDER — CHLORHEXIDINE GLUCONATE 4 % EX LIQD: 30.0000 mL | CUTANEOUS | Status: DC

## 2011-04-14 NOTE — Pre-Procedure Instructions (Signed)
20 Justin [name]  04/14/2011   Your procedure is scheduled on:  April 16, 2011 (Friday)  Report to [facility] at 5:30 AM.  Call this number if you have problems the morning of surgery: [phone]   Remember:   Do not eat food:After Midnight.  May have clear liquids: up to 4 Hours before arrival.  Clear liquids include soda, tea, black coffee, apple or grape juice, broth.  Take these medicines the morning of surgery with A SIP OF WATER: none   Do not wear jewelry, make-up or nail polish.  Do not wear lotions, powders, or perfumes. You may wear deodorant.  Do not shave 48 hours prior to surgery.  Do not bring valuables to the hospital.  Contacts, dentures or bridgework may not be worn into surgery.  Leave suitcase in the car. After surgery it may be brought to your room.  For patients admitted to the hospital, checkout time is 11:00 AM the day of discharge.   Patients discharged the day of surgery will not be allowed to drive home.  Name and phone number of your driver: NA  Special Instructions: CHG Shower Use Special Wash: 1/2 bottle night before surgery and 1/2 bottle morning of surgery.   Please read over the following fact sheets that you were given: Pain Booklet, Blood Transfusion Information, Open Heart Packet, MRSA Information and Surgical Site Infection Prevention

## 2011-04-14 NOTE — H&P (Signed)
CARDIOTHORACIC SURGERY HISTORY AND PHYSICAL EXAM  PCP is Monica Becton, MD, MD Referring Provider is Lewayne Bunting, MD    Chief Complaint   Patient presents with   .  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 The patient reports that he was first noted to have a heart murmur many years ago and diagnosed with bicuspid aortic valve and aortic insufficiency approximately 10 years ago. He has been followed with serial echocardiograms since that time.  He recently was seen in followup by Dr. Andee Lineman. Because his previous echocardiogram was poor quality, he scheduled a transesophageal echocardiogram which was performed 2 weeks ago. This revealed severe aortic regurgitation. There was prolapse of aortic valve which appeared to be bicuspid and an eccentric jet of regurgitation that was quite severe. Ejection fraction was estimated 50-55%. There was mild mitral regurgitation. There is mild dilatation of the aortic root. There was mild left ventricular chamber enlargement. The patient was referred to consider elective aortic valve repair or replacement.   Past Medical History  Diagnosis Date  . HTN (hypertension)   . Aortic stenosis     Bicuspid Ao Valve (TEE 2003, TTE 2007),moderate aortic insufficiency    Past Surgical History  Procedure Date  . Left arm surgery   . Vasectomy   . Anal fissure repair     Family History  Problem Relation Age of Onset  . Cancer Father 53    Social History History  Substance Use Topics  . Smoking status: Never Smoker   . Smokeless tobacco: Never Used  . Alcohol Use: Yes     Occasionally--socially    No current facility-administered medications for this encounter.   Current Outpatient Prescriptions  Medication Sig Dispense  Refill  . Cholecalciferol (VITAMIN D3) 2000 UNITS TABS Take 1 tablet by mouth daily.        . Multiple Vitamins-Minerals (CENTRUM SILVER PO) Take 1 tablet by mouth daily.        . Olmesartan-Amlodipine-HCTZ (TRIBENZOR) 40-5-25 MG TABS Take 1 tablet by mouth daily.  30 tablet  6  . Omega-3 Fatty Acids (FISH OIL CONCENTRATE PO) Take 1 tablet by mouth daily.        Marland Kitchen pyridoxine (B-6) 100 MG tablet Take 100 mg by mouth daily.      . vitamin B-12 (CYANOCOBALAMIN) 1000 MCG tablet Take 1,000 mcg by mouth daily.          No Known Allergies  Review of Systems:             General:                      normal appetite, normal energy               Respiratory:                no cough, no wheezing, no hemoptysis, no pain with inspiration or cough, no shortness of breath               Cardiac:                       no chest pain or tightness, no exertional SOB, no resting  SOB, no PND, no orthopnea, no LE edema, no palpitations, no syncope             GI:                                no difficulty swallowing, no hematochezia, no hematemesis, no melena, no constipation, no diarrhea               GU:                              no dysuria, no urgency, no frequency               Musculoskeletal:         no arthritis, no arthralgia other than some pain both ankles with ambulation               Vascular:                     no pain suggestive of claudication               Neuro:                         no symptoms suggestive of TIA's, no seizures, no headaches, no peripheral neuropathy               Endocrine:                   Negative               HEENT:                       no loose teeth or painful teeth, sees his dentist regularly,  no recent vision changes             Psych:                         no anxiety, no depression                Physical Exam:              BP 111/61  Pulse 75  Resp 20  Ht 5\' 6"  (1.676 m)  Wt 189 lb (85.73 kg)  BMI 30.51 kg/m2  SpO2 95%             General:                       well-appearing             HEENT:                       Unremarkable               Neck:                           no JVD, no bruits, no adenopathy               Chest:                         clear to auscultation, symmetrical breath sounds, no wheezes, no rhonchi  CV:                              RRR, grade III/VI diastolic murmur               Abdomen:                    soft, non-tender, no masses               Extremities:                 warm, well-perfused, pulses palpable             Rectal/GU                   Deferred             Neuro:                         Grossly non-focal and symmetrical throughout             Skin:                            Clean and dry, no rashes, no breakdown   Diagnostic Tests:  Transesophageal ECHO performed 02/24/2011 at [facility] and he is reviewed. This demonstrates what appears to be bicuspid aortic valve with severe prolapse and severe aortic insufficiency. There is mild left ventricular chamber enlargement but left ventricular systolic function is fairly normal with ejection fraction estimated between 50 and 55%. There is mild mitral regurgitation. No other significant abnormalities are noted.  Cardiac Catheterization Procedure Note  Name: Justin [name]   MRN: [id number]   DOB: 17-Oct-1955   Procedure: Right Heart Cath, Left Heart Cath, Selective Coronary Angiography, LV angiography, AO root, Distal AO   Indication:  Procedural Details: The right groin was prepped, draped, and anesthetized with 1% lidocaine. Using the modified Seldinger technique a 4 French sheath was placed in the left femoral artery and a 7 French sheath was placed in the left femoral vein. A Swan-Ganz catheter was used for the right heart catheterization. Standard protocol was followed for recording of right heart pressures and sampling of oxygen saturations. Fick cardiac output was calculated. Standard Judkins catheters were used for selective  coronary angiography and left ventriculography. There were no immediate procedural complications. The patient was transferred to the post catheterization recovery area for further monitoring.   Procedural Findings:   Hemodynamics:   RA 3  RV 22/6  PA mean 12  PCWP 7  LV 90/12  AO 94/65  Oxygen saturations:   PA 94  AO 57  Cardiac Output (Fick) 3.6 Cardiac Index (Fick) 1.9  Coronary angiography:   Coronary dominance: right   Left mainstem: Normal   Left anterior descending (LAD): Wraps the apex. Normal. Mid diagonal large and normal.  Left circumflex (LCx): AV groove normal. RI moderate sized normal. MOM large and normal. PL small to moderate sized and normal.   Right coronary artery (RCA): Mid to distal long 50 - 60% diffuse narrowing. Very small PDA and PL distal to this.   Left ventriculography: Left ventricular systolic function is normal, LVEF is estimated at 65%. Ventricular ectopy precludes adequate analysis.   AO root: There appears to be mild dilatation. There is severe 4+ AI.   AO distal: Normal   Final  Conclusions: Severe AI with moderate RCA stenosis. Preserved EF. Recommendations: The patient has follow up with Dr. Cornelius Moras and a planned CT to further plan aortic valve surgery.   Rollene Rotunda   03/25/2011, 9:15 AM       CT ANGIOGRAPHY OF THE HEART  FINDINGS:   Technical quality: Excellent   CORONARY ARTERIES:   Left main coronary artery: Negative.   Left anterior descending: Negative.   Ramus intermedius: Negative.   Left circumflex: Negative.   Right coronary artery: Negative.   Posterior descending artery: Patent.   Dominance: Codominant.   CORONARY CALCIUM:   Total Agatston Score: 0   MESA database percentile: N/A   CARDIAC FUNCTION:   Ejection fraction: 67%   End-diastolic volume: 409 ml   End-systolic volume: 811 ml   Stroke volume: 262 ml   Wall motion analysis: No regional wall motion abnormalities.   CARDIAC MEASUREMENTS:   Interventricular septum  (6 - 12 mm): 19 mm   LV posterior wall (6 - 12 mm): 12 mm   LV diameter in diastole (35 - 52 mm): 62 mm   LV diameter at end systole (21 - 40 mm): 43 mm   LA diameter at end systole (19 - 40 mm): 40 mm   AORTIC ROOT:   Aortic Valve Description: There are three thickened cusp moieties   and two commissures with an intervening median raphe very between   the left and right cusp moieties, consistent with a type 1 bicuspid   aortic valve (i.e., functionally bicuspid). Some calcification of   the fused cusp is noted (the left cusp moiety). There is   significant prolapse of the fused cusp moiety during diastole, with   a regurgitant orifice area of approximately 89 mm sq estimated by   planimetry, indicative of severe aortic insufficiency. During   systole, there is a normal aortic valve area (estimated at 5.3 cm   sq estimated by planimetry).   Aortic Valve Area: 5.3 cm-sq   Regurgitant Orifice Area: 0.89 cm-sq   Aortic Annulus (systolic measurents):   Long-axis: 39 mm   Short-axis: 32 mm   Cross-sectional area: 9.62 cm-sq   Circumference: 123 mm   Sinuses of Valsalva (diastolic measurements:   L-SOV - Width: 41 mm   Height: 24 mm   R-SOV - Width: 37 mm   Height: 27 mm   Rafael Gonzalez-SOV - Width: 42 mm   Height: 37 mm   AORTA AND PULMONARY MEASUREMENTS:   Ascending aorta ( < 40 mm): 37 mm   Descending aorta ( < 40 mm): 29 mm   Main pulmonary artery: ( < 30 mm): 25 mm   EXTRACARDIAC FINDINGS:   Visualized lung parenchyma demonstrates no suspicious appearing   pulmonary nodules or masses. No consolidative airspace disease.   No visualized pleural effusions. Visualized portions of the upper   abdomen are unremarkable. There are no aggressive appearing lytic   or blastic lesions noted in the visualized portions of the   skeleton.   IMPRESSION:   1. Type 1 bicuspid aortic valve with fusion of the left and right   cusp moieties. There is thickening and calcification of the valve   cusps, as  above, and prolapse of the fused cusp resulting in a   larger regurgitant and orifice area (0.89 cm-sq), consistent with   severe aortic insufficiency.   2. Aortic root measurements, as above.   3. Left ventricular dilatation and both end diastole and end   systole, as above,  without regional wall motion abnormalities.   4. Basal septal hypertrophy.   5. No evidence of significant coronary artery disease. The   patient's total coronary artery calcium score is zero.   6. Codominance of the coronary arteries.   Original Report Authenticated By: Florencia Reasons, M.D.   Impression:  Patient has bicuspid native aortic valve with severe aortic insufficiency. There is mild left ventricular chamber enlargement, although chamber dimensions are probably not quite large enough to meet criteria based upon ACC guidelines. There is mild left ventricular dysfunction with ejection fraction 50-55%. The patient remains completely asymptomatic. Because of the severity of aortic insufficiency, I think it is reasonable to consider elective surgical intervention in the near future. Alternatively the patient could continue medical therapy with close followup, but the patient truly has torrential aortic insufficiency documented by TEE.  I share Dr Margarita Mail concern that his left ventricular function might deteriorate fairly quickly without surgical intervention.   In addition, the patient has type I bicuspid aortic valve with prolapse of the fused left and right cusps causing severe (4+) aortic insufficiency. There is no aneurysmal enlargement of the aortic root nor a setting thoracic aorta. There is no significant coronary artery disease. I'm not at all impressed by the mild narrowing of the right coronary artery seen on catheterization and I do not feel that concomitant coronary artery bypass grafting would be necessary. Based on the images from cardiac CT I feel there is a chance that the patient's valve might be  repairable. I would not recommend attempting to repair the valve through the mini thoracotomy approach do to limited exposure and the potential for increased duration of surgery.  Alternatives include continued medical therapy with very close followup versus elective surgery at this time. If the patient desires for his valve to be replaced using a mechanical prosthesis to avoid any potential for the need for repeat surgery in the future, then I feel he would be an excellent candidate for minimally invasive approach for surgery. Alternatively, if the patient hopes to avoid Coumadin it would be reasonable to consider aortic valve repair with plans to replace his valve using a bioprosthetic tissue valve if his valve cannot be satisfactorily repaired at the time of surgery. I would recommend this be performed through a conventional sternotomy.  Plan:  I discussed matters at length with the patient and his family here in the office. The rationale for elective surgical intervention has been discussed and compared in contrast with continued medical therapy with close followup. Surgical alternatives have also been discussed in detail including the possibility of replacing his valve using a mechanical prosthesis, a bioprosthetic tissue valve, or possibly the Ross autograft procedure. We discussed the possibility of minimally invasive techniques for conventional aortic valve replacement and contrasted the risks and benefits to median sternotomy.  He is eager to proceed with surgery.  He does not wish to take Coumadin and he specifically requests that his valve be replaced using a bioprosthetic tissue valve if repair is not feasible. He understands the rationale for valve repair as well as the potential for late structural valve deterioration in failure requiring repeat surgery.  The patient understands and accepts all potential associated risks of surgery including but not limited to risk of death, stroke, myocardial  infarction, congestive heart failure, respiratory failure, renal failure, bleeding requiring blood transfusion and/or reexploration, arrhythmia, heart block or bradycardia requiring permanent pacemaker, pneumonia, pleural effusion, wound infection, pulmonary embolus or other thromboembolic complication, chronic pain or  other delayed complications.  All questions answered.  We plan to proceed with surgery on Friday, March 8.        Salvatore Decent. Cornelius Moras, MD

## 2011-04-14 NOTE — Progress Notes (Signed)
VASCULAR LAB PRELIMINARY  PRELIMINARY  PRELIMINARY  PRELIMINARY  Pre-op Cardiac Surgery  Carotid Findings: Bilateral:  No evidence of hemodynamically significant internal carotid artery stenosis.   Vertebral artery flow is antegrade.     Upper Extremity Right Left  Brachial Pressures 122 Triphasic 127 Triphasic  Radial Waveforms Triphasic Triphasic  Ulnar Waveforms Triphasic Triphasic  Palmar Arch (Allen's Test) Normal Normal   Findings:  Palmer arch evaluation - Doppler waveforms remained normal bilaterally with both radial and ulnar compressions.                              Bobbette Eakes D, RVS     04/14/2011, 12:45 PM

## 2011-04-15 LAB — TYPE AND SCREEN: Antibody Screen: NEGATIVE

## 2011-04-15 MED ORDER — POTASSIUM CHLORIDE 2 MEQ/ML IV SOLN
80.0000 meq | INTRAVENOUS | Status: DC
  Filled 2011-04-15: qty 40

## 2011-04-15 MED ORDER — SODIUM CHLORIDE 0.9 % IV SOLN
0.1000 ug/kg/h | INTRAVENOUS | Status: AC
  Administered 2011-04-16: .3 ug/kg/h via INTRAVENOUS
  Filled 2011-04-15: qty 4

## 2011-04-15 MED ORDER — METOPROLOL TARTRATE 12.5 MG HALF TABLET
12.5000 mg | ORAL_TABLET | Freq: Once | ORAL | Status: AC
  Administered 2011-04-16: 12.5 mg via ORAL
  Filled 2011-04-15: qty 1

## 2011-04-15 MED ORDER — SODIUM CHLORIDE 0.9 % IV SOLN
INTRAVENOUS | Status: AC
  Administered 2011-04-16: 70 mL/h via INTRAVENOUS
  Filled 2011-04-15: qty 40

## 2011-04-15 MED ORDER — PHENYLEPHRINE HCL 10 MG/ML IJ SOLN
30.0000 ug/min | INTRAVENOUS | Status: DC
  Filled 2011-04-15: qty 2

## 2011-04-15 MED ORDER — VERAPAMIL HCL 2.5 MG/ML IV SOLN
INTRAVENOUS | Status: DC
  Filled 2011-04-15 (×2): qty 2.5

## 2011-04-15 MED ORDER — DOPAMINE-DEXTROSE 3.2-5 MG/ML-% IV SOLN
2.0000 ug/kg/min | INTRAVENOUS | Status: DC
  Filled 2011-04-15: qty 250

## 2011-04-15 MED ORDER — DEXTROSE 5 % IV SOLN
1.5000 g | INTRAVENOUS | Status: AC
  Administered 2011-04-16: .75 g via INTRAVENOUS
  Administered 2011-04-16: 1.5 g via INTRAVENOUS
  Filled 2011-04-15: qty 1.5

## 2011-04-15 MED ORDER — SODIUM CHLORIDE 0.9 % IV SOLN
INTRAVENOUS | Status: AC
  Administered 2011-04-16: 1.4 [IU]/h via INTRAVENOUS
  Filled 2011-04-15: qty 1

## 2011-04-15 MED ORDER — MAGNESIUM SULFATE 50 % IJ SOLN
40.0000 meq | INTRAMUSCULAR | Status: DC
  Filled 2011-04-15: qty 10

## 2011-04-15 MED ORDER — EPINEPHRINE HCL 1 MG/ML IJ SOLN
0.5000 ug/min | INTRAVENOUS | Status: DC
  Filled 2011-04-15: qty 4

## 2011-04-15 MED ORDER — DEXTROSE 5 % IV SOLN
750.0000 mg | INTRAVENOUS | Status: DC
  Filled 2011-04-15: qty 750

## 2011-04-15 MED ORDER — NITROGLYCERIN IN D5W 200-5 MCG/ML-% IV SOLN
2.0000 ug/min | INTRAVENOUS | Status: DC
  Filled 2011-04-15: qty 250

## 2011-04-15 MED ORDER — VANCOMYCIN HCL 1000 MG IV SOLR
1250.0000 mg | INTRAVENOUS | Status: AC
  Administered 2011-04-16: 1250 mg via INTRAVENOUS
  Filled 2011-04-15: qty 1250

## 2011-04-16 ENCOUNTER — Inpatient Hospital Stay (HOSPITAL_COMMUNITY)
Admission: RE | Admit: 2011-04-16 | Discharge: 2011-04-20 | DRG: 221 | Disposition: A | Discharge status: Home / Self Care | Payer: 59 | Source: Ambulatory Visit | Attending: Thoracic Surgery (Cardiothoracic Vascular Surgery) | Admitting: Thoracic Surgery (Cardiothoracic Vascular Surgery)

## 2011-04-16 ENCOUNTER — Encounter (HOSPITAL_COMMUNITY): Payer: Self-pay | Admitting: *Deleted

## 2011-04-16 ENCOUNTER — Inpatient Hospital Stay (HOSPITAL_COMMUNITY): Payer: 59

## 2011-04-16 ENCOUNTER — Encounter (HOSPITAL_COMMUNITY): Payer: Self-pay | Admitting: Thoracic Surgery (Cardiothoracic Vascular Surgery)

## 2011-04-16 ENCOUNTER — Encounter (HOSPITAL_COMMUNITY): Payer: Self-pay | Admitting: Anesthesiology

## 2011-04-16 ENCOUNTER — Encounter (HOSPITAL_COMMUNITY)
Admission: RE | Disposition: A | Discharge status: Home / Self Care | Payer: Self-pay | Source: Ambulatory Visit | Attending: Thoracic Surgery (Cardiothoracic Vascular Surgery)

## 2011-04-16 ENCOUNTER — Ambulatory Visit (HOSPITAL_COMMUNITY): Payer: 59 | Admitting: Anesthesiology

## 2011-04-16 DIAGNOSIS — Q231 Congenital insufficiency of aortic valve: Principal | ICD-10-CM

## 2011-04-16 DIAGNOSIS — Z01812 Encounter for preprocedural laboratory examination: Secondary | ICD-10-CM

## 2011-04-16 DIAGNOSIS — I359 Nonrheumatic aortic valve disorder, unspecified: Secondary | ICD-10-CM

## 2011-04-16 DIAGNOSIS — Q2381 Bicuspid aortic valve: Secondary | ICD-10-CM

## 2011-04-16 DIAGNOSIS — I10 Essential (primary) hypertension: Secondary | ICD-10-CM | POA: Diagnosis present

## 2011-04-16 DIAGNOSIS — Z952 Presence of prosthetic heart valve: Secondary | ICD-10-CM

## 2011-04-16 HISTORY — DX: Presence of prosthetic heart valve: Z95.2

## 2011-04-16 HISTORY — PX: AORTIC VALVE REPLACEMENT: SHX41

## 2011-04-16 HISTORY — DX: Bicuspid aortic valve: Q23.81

## 2011-04-16 HISTORY — DX: Congenital insufficiency of aortic valve: Q23.1

## 2011-04-16 LAB — POCT I-STAT 3, ART BLOOD GAS (G3+)
Acid-base deficit: 1 mmol/L (ref 0.0–2.0)
Bicarbonate: 20.6 mEq/L (ref 20.0–24.0)
O2 Saturation: 100 %
O2 Saturation: 99 %
Patient temperature: 37.3
TCO2: 22 mmol/L (ref 0–100)
TCO2: 20 mmol/L (ref 0–100)
pCO2 arterial: 34.6 mmHg — ABNORMAL LOW (ref 35.0–45.0)
pH, Arterial: 7.38 (ref 7.350–7.450)
pH, Arterial: 7.435 (ref 7.350–7.450)

## 2011-04-16 LAB — POCT I-STAT, CHEM 8
Creatinine, Ser: 1.1 mg/dL (ref 0.50–1.35)
Glucose, Bld: 156 mg/dL — ABNORMAL HIGH (ref 70–99)
Hemoglobin: 11.2 g/dL — ABNORMAL LOW (ref 13.0–17.0)
Sodium: 138 mEq/L (ref 135–145)
TCO2: 19 mmol/L (ref 0–100)

## 2011-04-16 LAB — POCT I-STAT 4, (NA,K, GLUC, HGB,HCT)
Glucose, Bld: 115 mg/dL — ABNORMAL HIGH (ref 70–99)
Glucose, Bld: 120 mg/dL — ABNORMAL HIGH (ref 70–99)
Glucose, Bld: 128 mg/dL — ABNORMAL HIGH (ref 70–99)
HCT: 30 % — ABNORMAL LOW (ref 39.0–52.0)
HCT: 25 % — ABNORMAL LOW (ref 39.0–52.0)
HCT: 28 % — ABNORMAL LOW (ref 39.0–52.0)
HCT: 29 % — ABNORMAL LOW (ref 39.0–52.0)
Hemoglobin: 11.2 g/dL — ABNORMAL LOW (ref 13.0–17.0)
Hemoglobin: 8.5 g/dL — ABNORMAL LOW (ref 13.0–17.0)
Hemoglobin: 9.9 g/dL — ABNORMAL LOW (ref 13.0–17.0)
Potassium: 3.5 mEq/L (ref 3.5–5.1)
Potassium: 5.5 mEq/L — ABNORMAL HIGH (ref 3.5–5.1)
Potassium: 4.1 mEq/L (ref 3.5–5.1)
Sodium: 141 mEq/L (ref 135–145)
Sodium: 139 mEq/L (ref 135–145)
Sodium: 133 mEq/L — ABNORMAL LOW (ref 135–145)
Sodium: 138 mEq/L (ref 135–145)
Sodium: 138 mEq/L (ref 135–145)

## 2011-04-16 LAB — CREATININE, SERUM
Creatinine, Ser: 1.09 mg/dL (ref 0.50–1.35)
GFR calc Af Amer: 88 mL/min — ABNORMAL LOW (ref >90)
GFR calc non Af Amer: 76 mL/min — ABNORMAL LOW (ref >90)

## 2011-04-16 LAB — CBC
HCT: 31.7 % — ABNORMAL LOW (ref 39.0–52.0)
HCT: 33.3 % — ABNORMAL LOW (ref 39.0–52.0)
Hemoglobin: 11.7 g/dL — ABNORMAL LOW (ref 13.0–17.0)
MCH: 31.8 pg (ref 26.0–34.0)
MCH: 31.9 pg (ref 26.0–34.0)
MCH: 32.1 pg (ref 26.0–34.0)
MCHC: 37.2 g/dL — ABNORMAL HIGH (ref 30.0–36.0)
MCV: 86.4 fL (ref 78.0–100.0)
Platelets: 156 10*3/uL (ref 150–400)
RBC: 4.31 MIL/uL (ref 4.22–5.81)
RBC: 3.67 MIL/uL — ABNORMAL LOW (ref 4.22–5.81)
RDW: 13.2 % (ref 11.5–15.5)

## 2011-04-16 LAB — MAGNESIUM: Magnesium: 2.9 mg/dL — ABNORMAL HIGH (ref 1.5–2.5)

## 2011-04-16 LAB — HEMOGLOBIN AND HEMATOCRIT, BLOOD: Hemoglobin: 9.5 g/dL — ABNORMAL LOW (ref 13.0–17.0)

## 2011-04-16 LAB — GLUCOSE, CAPILLARY: Glucose-Capillary: 95 mg/dL (ref 70–99)

## 2011-04-16 SURGERY — REPLACEMENT, AORTIC VALVE, OPEN
Anesthesia: General | Site: Chest | Wound class: Clean

## 2011-04-16 MED ORDER — SODIUM CHLORIDE 0.9 % IV SOLN
20.0000 mg | INTRAVENOUS | Status: DC | PRN
  Administered 2011-04-16: 15 ug/min via INTRAVENOUS

## 2011-04-16 MED ORDER — FENTANYL CITRATE 0.05 MG/ML IJ SOLN
INTRAMUSCULAR | Status: DC | PRN
  Administered 2011-04-16: 500 ug via INTRAVENOUS
  Administered 2011-04-16: 900 ug via INTRAVENOUS
  Administered 2011-04-16: 100 ug via INTRAVENOUS
  Administered 2011-04-16: 250 ug via INTRAVENOUS
  Administered 2011-04-16: 50 ug via INTRAVENOUS
  Administered 2011-04-16: 100 ug via INTRAVENOUS

## 2011-04-16 MED ORDER — DEXTROSE 5 % IV SOLN
0.0000 ug/min | INTRAVENOUS | Status: DC
  Administered 2011-04-16: 10 ug/min via INTRAVENOUS
  Filled 2011-04-16: qty 2

## 2011-04-16 MED ORDER — SODIUM CHLORIDE 0.9 % IJ SOLN: 3.0000 mL | INTRAMUSCULAR | Status: DC | PRN

## 2011-04-16 MED ORDER — SODIUM CHLORIDE 0.9 % IV SOLN: 250.0000 mL | INTRAVENOUS | Status: DC

## 2011-04-16 MED ORDER — VECURONIUM BROMIDE 10 MG IV SOLR
INTRAVENOUS | Status: DC | PRN
  Administered 2011-04-16 (×5): 5 mg via INTRAVENOUS

## 2011-04-16 MED ORDER — DEXTROSE 5 % IV SOLN
INTRAVENOUS | Status: DC | PRN
  Administered 2011-04-16: 12:00:00 via INTRAVENOUS

## 2011-04-16 MED ORDER — VANCOMYCIN HCL 1000 MG IV SOLR
1000.0000 mg | Freq: Once | INTRAVENOUS | Status: AC
  Administered 2011-04-16: 1000 mg via INTRAVENOUS
  Filled 2011-04-16: qty 1000

## 2011-04-16 MED ORDER — PROPOFOL 10 MG/ML IV EMUL
INTRAVENOUS | Status: DC | PRN
  Administered 2011-04-16: 100 mg via INTRAVENOUS
  Administered 2011-04-16: 50 mg via INTRAVENOUS

## 2011-04-16 MED ORDER — DEXTROSE 5 % IV SOLN
INTRAVENOUS | Status: DC | PRN
  Administered 2011-04-16: 08:00:00 via INTRAVENOUS

## 2011-04-16 MED ORDER — MORPHINE SULFATE 4 MG/ML IJ SOLN
2.0000 mg | INTRAMUSCULAR | Status: DC | PRN
  Administered 2011-04-16 – 2011-04-17 (×6): 4 mg via INTRAVENOUS
  Filled 2011-04-16 (×8): qty 1

## 2011-04-16 MED ORDER — ONDANSETRON HCL 4 MG/2ML IJ SOLN
4.0000 mg | Freq: Four times a day (QID) | INTRAMUSCULAR | Status: DC | PRN
  Filled 2011-04-16: qty 2

## 2011-04-16 MED ORDER — SODIUM CHLORIDE 0.9 % IJ SOLN
3.0000 mL | Freq: Two times a day (BID) | INTRAMUSCULAR | Status: DC
  Administered 2011-04-17 – 2011-04-18 (×2): 3 mL via INTRAVENOUS

## 2011-04-16 MED ORDER — LACTATED RINGERS IV SOLN
INTRAVENOUS | Status: DC
  Administered 2011-04-16: 14:00:00 via INTRAVENOUS

## 2011-04-16 MED ORDER — INSULIN REGULAR BOLUS VIA INFUSION
0.0000 [IU] | Freq: Three times a day (TID) | INTRAVENOUS | Status: DC
  Filled 2011-04-16: qty 10

## 2011-04-16 MED ORDER — ALBUMIN HUMAN 5 % IV SOLN
250.0000 mL | INTRAVENOUS | Status: AC | PRN
  Administered 2011-04-16 – 2011-04-17 (×2): 250 mL via INTRAVENOUS

## 2011-04-16 MED ORDER — SODIUM CHLORIDE 0.9 % IV SOLN
0.1000 ug/kg/h | INTRAVENOUS | Status: DC
  Administered 2011-04-16: 0.4 ug/kg/h via INTRAVENOUS
  Filled 2011-04-16: qty 2

## 2011-04-16 MED ORDER — ACETAMINOPHEN 500 MG PO TABS
1000.0000 mg | ORAL_TABLET | Freq: Four times a day (QID) | ORAL | Status: DC
  Administered 2011-04-17 – 2011-04-18 (×4): 1000 mg via ORAL
  Administered 2011-04-18: 500 mg via ORAL
  Administered 2011-04-18 – 2011-04-20 (×6): 1000 mg via ORAL
  Filled 2011-04-16 (×18): qty 2

## 2011-04-16 MED ORDER — SODIUM CHLORIDE 0.9 % IV SOLN
0.4000 ug/kg/h | INTRAVENOUS | Status: DC
  Administered 2011-04-16: 0.5 ug/kg/h via INTRAVENOUS
  Filled 2011-04-16: qty 4

## 2011-04-16 MED ORDER — METOPROLOL TARTRATE 12.5 MG HALF TABLET
12.5000 mg | ORAL_TABLET | Freq: Two times a day (BID) | ORAL | Status: DC
  Administered 2011-04-17 – 2011-04-18 (×2): 12.5 mg via ORAL
  Filled 2011-04-16 (×7): qty 1

## 2011-04-16 MED ORDER — OXYCODONE HCL 5 MG PO TABS
5.0000 mg | ORAL_TABLET | ORAL | Status: DC | PRN
  Administered 2011-04-17 (×3): 10 mg via ORAL
  Filled 2011-04-16 (×4): qty 2

## 2011-04-16 MED ORDER — ROCURONIUM BROMIDE 100 MG/10ML IV SOLN
INTRAVENOUS | Status: DC | PRN
  Administered 2011-04-16: 50 mg via INTRAVENOUS

## 2011-04-16 MED ORDER — SODIUM CHLORIDE 0.9 % IR SOLN
Status: DC | PRN
  Administered 2011-04-16: 6000 mL

## 2011-04-16 MED ORDER — ASPIRIN EC 325 MG PO TBEC
325.0000 mg | DELAYED_RELEASE_TABLET | Freq: Every day | ORAL | Status: DC
  Administered 2011-04-17 – 2011-04-20 (×3): 325 mg via ORAL
  Filled 2011-04-16 (×4): qty 1

## 2011-04-16 MED ORDER — BISACODYL 10 MG RE SUPP: 10.0000 mg | Freq: Every day | RECTAL | Status: DC

## 2011-04-16 MED ORDER — HEMOSTATIC AGENTS (NO CHARGE) OPTIME
TOPICAL | Status: DC | PRN
  Administered 2011-04-16: 2 via TOPICAL

## 2011-04-16 MED ORDER — SODIUM CHLORIDE 0.45 % IV SOLN
INTRAVENOUS | Status: DC
  Administered 2011-04-16: 14:00:00 via INTRAVENOUS

## 2011-04-16 MED ORDER — FAMOTIDINE IN NACL 20-0.9 MG/50ML-% IV SOLN
20.0000 mg | Freq: Two times a day (BID) | INTRAVENOUS | Status: AC
  Administered 2011-04-16: 20 mg via INTRAVENOUS

## 2011-04-16 MED ORDER — INSULIN ASPART 100 UNIT/ML ~~LOC~~ SOLN
0.0000 [IU] | SUBCUTANEOUS | Status: AC
  Administered 2011-04-16 (×2): 2 [IU] via SUBCUTANEOUS
  Filled 2011-04-16: qty 3

## 2011-04-16 MED ORDER — MAGNESIUM SULFATE 40 MG/ML IJ SOLN
INTRAMUSCULAR | Status: AC
  Administered 2011-04-16: 4 g via INTRAVENOUS
  Filled 2011-04-16: qty 100

## 2011-04-16 MED ORDER — SODIUM CHLORIDE 0.9 % IV SOLN
INTRAVENOUS | Status: DC
  Administered 2011-04-16: 14:00:00 via INTRAVENOUS

## 2011-04-16 MED ORDER — POTASSIUM CHLORIDE 10 MEQ/50ML IV SOLN: 10.0000 meq | INTRAVENOUS | Status: AC

## 2011-04-16 MED ORDER — MIDAZOLAM HCL 5 MG/5ML IJ SOLN
INTRAMUSCULAR | Status: DC | PRN
  Administered 2011-04-16 (×2): 4 mg via INTRAVENOUS
  Administered 2011-04-16 (×4): 2 mg via INTRAVENOUS

## 2011-04-16 MED ORDER — PANTOPRAZOLE SODIUM 40 MG PO TBEC
40.0000 mg | DELAYED_RELEASE_TABLET | Freq: Every day | ORAL | Status: DC
  Administered 2011-04-18 – 2011-04-20 (×3): 40 mg via ORAL
  Filled 2011-04-16 (×3): qty 1

## 2011-04-16 MED ORDER — DEXTROSE 5 % IV SOLN
1.5000 g | Freq: Two times a day (BID) | INTRAVENOUS | Status: DC
  Administered 2011-04-16 – 2011-04-18 (×4): 1.5 g via INTRAVENOUS
  Filled 2011-04-16 (×4): qty 1.5

## 2011-04-16 MED ORDER — MAGNESIUM SULFATE 40 MG/ML IJ SOLN
4.0000 g | Freq: Once | INTRAMUSCULAR | Status: AC
  Administered 2011-04-16: 4 g via INTRAVENOUS

## 2011-04-16 MED ORDER — BISACODYL 5 MG PO TBEC
10.0000 mg | DELAYED_RELEASE_TABLET | Freq: Every day | ORAL | Status: DC
  Administered 2011-04-17 – 2011-04-19 (×2): 10 mg via ORAL
  Filled 2011-04-16 (×3): qty 2

## 2011-04-16 MED ORDER — INSULIN ASPART 100 UNIT/ML ~~LOC~~ SOLN
0.0000 [IU] | SUBCUTANEOUS | Status: DC
  Administered 2011-04-17 – 2011-04-18 (×8): 2 [IU] via SUBCUTANEOUS
  Administered 2011-04-18: 4 [IU] via SUBCUTANEOUS

## 2011-04-16 MED ORDER — VITAMIN B-6 100 MG PO TABS
100.0000 mg | ORAL_TABLET | Freq: Every day | ORAL | Status: DC
  Administered 2011-04-17 – 2011-04-20 (×3): 100 mg via ORAL
  Filled 2011-04-16 (×4): qty 1

## 2011-04-16 MED ORDER — DOCUSATE SODIUM 100 MG PO CAPS
200.0000 mg | ORAL_CAPSULE | Freq: Every day | ORAL | Status: DC
  Administered 2011-04-17: 200 mg via ORAL
  Filled 2011-04-16: qty 2

## 2011-04-16 MED ORDER — SODIUM CHLORIDE 0.9 % IV SOLN
INTRAVENOUS | Status: DC
  Administered 2011-04-16: 1.4 [IU]/h via INTRAVENOUS
  Filled 2011-04-16: qty 1

## 2011-04-16 MED ORDER — ACETAMINOPHEN 650 MG RE SUPP
650.0000 mg | RECTAL | Status: AC
  Administered 2011-04-16: 650 mg via RECTAL

## 2011-04-16 MED ORDER — MIDAZOLAM HCL 2 MG/2ML IJ SOLN: 2.0000 mg | INTRAMUSCULAR | Status: DC | PRN

## 2011-04-16 MED ORDER — METOPROLOL TARTRATE 1 MG/ML IV SOLN: 2.5000 mg | INTRAVENOUS | Status: DC | PRN

## 2011-04-16 MED ORDER — MORPHINE SULFATE 2 MG/ML IJ SOLN: 1.0000 mg | INTRAMUSCULAR | Status: AC | PRN

## 2011-04-16 MED ORDER — ACETAMINOPHEN 160 MG/5ML PO SOLN: 650.0000 mg | ORAL | Status: AC

## 2011-04-16 MED ORDER — PROTAMINE SULFATE 10 MG/ML IV SOLN
INTRAVENOUS | Status: DC | PRN
  Administered 2011-04-16 (×5): 50 mg via INTRAVENOUS
  Administered 2011-04-16: 20 mg via INTRAVENOUS
  Administered 2011-04-16: 10 mg via INTRAVENOUS
  Administered 2011-04-16: 20 mg via INTRAVENOUS

## 2011-04-16 MED ORDER — HEPARIN SODIUM (PORCINE) 1000 UNIT/ML IJ SOLN
INTRAMUSCULAR | Status: DC | PRN
  Administered 2011-04-16: 25000 [IU] via INTRAVENOUS

## 2011-04-16 MED ORDER — ASPIRIN 81 MG PO CHEW: 324.0000 mg | CHEWABLE_TABLET | Freq: Every day | ORAL | Status: DC

## 2011-04-16 MED ORDER — CALCIUM CHLORIDE 10 % IV SOLN
1.0000 g | Freq: Once | INTRAVENOUS | Status: AC | PRN
  Filled 2011-04-16: qty 10

## 2011-04-16 MED ORDER — NITROGLYCERIN IN D5W 200-5 MCG/ML-% IV SOLN: 0.0000 ug/min | INTRAVENOUS | Status: DC

## 2011-04-16 MED ORDER — ACETAMINOPHEN 160 MG/5ML PO SOLN
975.0000 mg | Freq: Four times a day (QID) | ORAL | Status: DC
  Filled 2011-04-16: qty 40.6

## 2011-04-16 MED ORDER — VITAMIN B-12 1000 MCG PO TABS
1000.0000 ug | ORAL_TABLET | Freq: Every day | ORAL | Status: DC
  Administered 2011-04-17 – 2011-04-20 (×3): 1000 ug via ORAL
  Filled 2011-04-16 (×4): qty 1

## 2011-04-16 MED ORDER — SODIUM CHLORIDE 0.9 % IV SOLN
INTRAVENOUS | Status: DC | PRN
  Administered 2011-04-16: 08:00:00 via INTRAVENOUS

## 2011-04-16 MED ORDER — LACTATED RINGERS IV SOLN
INTRAVENOUS | Status: DC | PRN
  Administered 2011-04-16: 07:00:00 via INTRAVENOUS

## 2011-04-16 MED ORDER — METOPROLOL TARTRATE 25 MG/10 ML ORAL SUSPENSION
12.5000 mg | Freq: Two times a day (BID) | ORAL | Status: DC
  Filled 2011-04-16 (×7): qty 5

## 2011-04-16 SURGICAL SUPPLY — 95 items
ADAPTER CARDIO PERF ANTE/RETRO (ADAPTER) ×2 IMPLANT
ADH SRG 12 PREFL SYR 3 SPRDR (MISCELLANEOUS)
ADPR PRFSN 84XANTGRD RTRGD (ADAPTER) ×1
APPLICATOR TIP BIOGLUE STANDRD (MISCELLANEOUS) IMPLANT
ATTRACTOMAT 16X20 MAGNETIC DRP (DRAPES) ×2 IMPLANT
BAG DECANTER FOR FLEXI CONT (MISCELLANEOUS) ×2 IMPLANT
BLADE STERNUM SYSTEM 6 (BLADE) ×2 IMPLANT
BLADE SURG 11 STRL SS (BLADE) ×2 IMPLANT
CANISTER SUCTION 2500CC (MISCELLANEOUS) ×2 IMPLANT
CANNULA DLP CORONARY OST 12FR (MISCELLANEOUS) ×1 IMPLANT
CANNULA GUNDRY RCSP 15FR (MISCELLANEOUS) ×2 IMPLANT
CATH CPB KIT OWEN (MISCELLANEOUS) ×2 IMPLANT
CATH HEART VENT LEFT (CATHETERS) ×1 IMPLANT
CATH ROBINSON RED A/P 18FR (CATHETERS) ×7 IMPLANT
CATH THORACIC 36FR (CATHETERS) IMPLANT
CATH THORACIC 36FR RT ANG (CATHETERS) ×2 IMPLANT
CLOTH BEACON ORANGE TIMEOUT ST (SAFETY) ×2 IMPLANT
COVER MAYO STAND STRL (DRAPES) ×1 IMPLANT
COVER SURGICAL LIGHT HANDLE (MISCELLANEOUS) ×4 IMPLANT
CRADLE DONUT ADULT HEAD (MISCELLANEOUS) ×2 IMPLANT
DRAIN CHANNEL 32F RND 10.7 FF (WOUND CARE) IMPLANT
DRAPE INCISE IOBAN 66X45 STRL (DRAPES) ×2 IMPLANT
DRAPE SLUSH/WARMER DISC (DRAPES) ×2 IMPLANT
DRSG COVADERM 4X10 (GAUZE/BANDAGES/DRESSINGS) ×1 IMPLANT
DRSG COVADERM 4X14 (GAUZE/BANDAGES/DRESSINGS) ×2 IMPLANT
ELECT REM PT RETURN 9FT ADLT (ELECTROSURGICAL) ×4
ELECTRODE REM PT RTRN 9FT ADLT (ELECTROSURGICAL) ×2 IMPLANT
GAUZE SPONGE 4X4 12PLY STRL LF (GAUZE/BANDAGES/DRESSINGS) ×1 IMPLANT
GLOVE BIO SURGEON STRL SZ 6 (GLOVE) IMPLANT
GLOVE BIO SURGEON STRL SZ 6.5 (GLOVE) ×3 IMPLANT
GLOVE BIO SURGEON STRL SZ7 (GLOVE) IMPLANT
GLOVE BIO SURGEON STRL SZ7.5 (GLOVE) IMPLANT
GLOVE BIOGEL PI IND STRL 6 (GLOVE) IMPLANT
GLOVE BIOGEL PI IND STRL 7.0 (GLOVE) IMPLANT
GLOVE BIOGEL PI INDICATOR 6 (GLOVE) ×3
GLOVE BIOGEL PI INDICATOR 7.0 (GLOVE) ×3
GLOVE ORTHO TXT STRL SZ7.5 (GLOVE) ×6 IMPLANT
GOWN BRE IMP SLV AUR LG STRL (GOWN DISPOSABLE) ×1 IMPLANT
GOWN STRL NON-REIN LRG LVL3 (GOWN DISPOSABLE) ×11 IMPLANT
GRAFT PTCH CORMATRIX 7X10 4PLY (Prosthesis & Implant Heart) ×1 IMPLANT
HEMOSTAT POWDER SURGIFOAM 1G (HEMOSTASIS) ×6 IMPLANT
INSERT FOGARTY XLG (MISCELLANEOUS) ×2 IMPLANT
KIT BASIN OR (CUSTOM PROCEDURE TRAY) ×2 IMPLANT
KIT ROOM TURNOVER OR (KITS) ×2 IMPLANT
KIT SUCTION CATH 14FR (SUCTIONS) ×2 IMPLANT
LEAD PACING MYOCARDI (MISCELLANEOUS) ×2 IMPLANT
LINE VENT (MISCELLANEOUS) ×1 IMPLANT
MARKER SKIN DUAL TIP RULER LAB (MISCELLANEOUS) ×1 IMPLANT
NS IRRIG 1000ML POUR BTL (IV SOLUTION) ×10 IMPLANT
PACK OPEN HEART (CUSTOM PROCEDURE TRAY) ×2 IMPLANT
PAD ARMBOARD 7.5X6 YLW CONV (MISCELLANEOUS) ×4 IMPLANT
PENCIL BUTTON HOLSTER BLD 10FT (ELECTRODE) ×1 IMPLANT
SET CARDIOPLEGIA MPS 5001102 (MISCELLANEOUS) ×1 IMPLANT
SET IRRIG TUBING LAPAROSCOPIC (IRRIGATION / IRRIGATOR) ×2 IMPLANT
SPONGE GAUZE 4X4 12PLY (GAUZE/BANDAGES/DRESSINGS) ×4 IMPLANT
SUT BONE WAX W31G (SUTURE) ×2 IMPLANT
SUT ETHIBON 2 0 V 52N 30 (SUTURE) ×2 IMPLANT
SUT ETHIBON EXCEL 2-0 V-5 (SUTURE) IMPLANT
SUT ETHIBOND 2 0 SH (SUTURE)
SUT ETHIBOND 2 0 SH 36X2 (SUTURE) IMPLANT
SUT ETHIBOND 2 0 V4 (SUTURE) IMPLANT
SUT ETHIBOND 2 0V4 GREEN (SUTURE) IMPLANT
SUT ETHIBOND 4 0 RB 1 (SUTURE) IMPLANT
SUT ETHIBOND V-5 VALVE (SUTURE) IMPLANT
SUT ETHIBOND X763 2 0 SH 1 (SUTURE) ×6 IMPLANT
SUT MNCRL AB 3-0 PS2 18 (SUTURE) ×4 IMPLANT
SUT PDS AB 1 CTX 36 (SUTURE) ×4 IMPLANT
SUT PROLENE 3 0 SH DA (SUTURE) ×2 IMPLANT
SUT PROLENE 3 0 SH1 36 (SUTURE) ×1 IMPLANT
SUT PROLENE 4 0 RB 1 (SUTURE) ×32
SUT PROLENE 4 0 SH DA (SUTURE) IMPLANT
SUT PROLENE 4-0 RB1 .5 CRCL 36 (SUTURE) IMPLANT
SUT PROLENE 5 0 C 1 36 (SUTURE) ×7 IMPLANT
SUT PROLENE 6 0 C 1 30 (SUTURE) ×5 IMPLANT
SUT SILK  1 MH (SUTURE) ×1
SUT SILK 1 MH (SUTURE) ×1 IMPLANT
SUT SILK 2 0 SH CR/8 (SUTURE) IMPLANT
SUT SILK 3 0 SH CR/8 (SUTURE) IMPLANT
SUT STEEL 6MS V (SUTURE) IMPLANT
SUT STEEL STERNAL CCS#1 18IN (SUTURE) ×1 IMPLANT
SUT STEEL SZ 6 DBL 3X14 BALL (SUTURE) ×2 IMPLANT
SUT VIC AB 2-0 CTX 27 (SUTURE) ×2 IMPLANT
SUTURE E-PAK OPEN HEART (SUTURE) ×2 IMPLANT
SYR 10ML KIT SKIN ADHESIVE (MISCELLANEOUS) IMPLANT
SYSTEM SAHARA CHEST DRAIN ATS (WOUND CARE) ×2 IMPLANT
TAPE CLOTH SURG 4X10 WHT LF (GAUZE/BANDAGES/DRESSINGS) ×1 IMPLANT
TOWEL OR 17X24 6PK STRL BLUE (TOWEL DISPOSABLE) ×2 IMPLANT
TOWEL OR 17X26 10 PK STRL BLUE (TOWEL DISPOSABLE) ×4 IMPLANT
TRAY FOLEY IC TEMP SENS 14FR (CATHETERS) ×2 IMPLANT
TUBE CONNECTING 12X1/4 (SUCTIONS) ×1 IMPLANT
TUBE SUCT INTRACARD DLP 20F (MISCELLANEOUS) ×2 IMPLANT
UNDERPAD 30X30 INCONTINENT (UNDERPADS AND DIAPERS) ×2 IMPLANT
VALVE MAGNA EASE AORTIC 27MM (Prosthesis & Implant Heart) ×1 IMPLANT
VENT LEFT HEART 12002 (CATHETERS) ×2
WATER STERILE IRR 1000ML POUR (IV SOLUTION) ×4 IMPLANT

## 2011-04-16 NOTE — Anesthesia Preprocedure Evaluation (Addendum)
Anesthesia Evaluation  Patient identified by MRN, date of birth, ID band Patient awake    Reviewed: Allergy & Precautions, H&P , NPO status , Patient's Chart, lab work & pertinent test results, reviewed documented beta blocker date and time   Airway Mallampati: I TM Distance: >3 FB Neck ROM: Full    Dental No notable dental hx. (+) Partial Upper   Pulmonary neg pulmonary ROS,  breath sounds clear to auscultation  Pulmonary exam normal       Cardiovascular hypertension, Pt. on medications Rhythm:Regular Rate:Bradycardia     Neuro/Psych negative neurological ROS  negative psych ROS   GI/Hepatic negative GI ROS, Neg liver ROS,   Endo/Other  negative endocrine ROS  Renal/GU negative Renal ROS  negative genitourinary   Musculoskeletal negative musculoskeletal ROS (+)   Abdominal Normal abdominal exam  (+)   Peds negative pediatric ROS (+)  Hematology negative hematology ROS (+)   Anesthesia Other Findings   Reproductive/Obstetrics negative OB ROS                          Anesthesia Physical Anesthesia Plan  ASA: III  Anesthesia Plan: General   Post-op Pain Management:    Induction: Intravenous  Airway Management Planned: Oral ETT  Additional Equipment: Arterial line, PA Cath, 3D TEE and Ultrasound Guidance Line Placement  Intra-op Plan:   Post-operative Plan: Post-operative intubation/ventilation  Informed Consent: I have reviewed the patients History and Physical, chart, labs and discussed the procedure including the risks, benefits and alternatives for the proposed anesthesia with the patient or authorized representative who has indicated his/her understanding and acceptance.     Plan Discussed with: CRNA and Surgeon  Anesthesia Plan Comments:         Anesthesia Quick Evaluation

## 2011-04-16 NOTE — Progress Notes (Signed)
UR Completed.  Juhi Lagrange Jane [phone] 04/16/2011  

## 2011-04-16 NOTE — Progress Notes (Signed)
TCTS BRIEF SICU PROGRESS NOTE  Day of Surgery  S/P Procedure(s) (LRB): AORTIC VALVE REPLACEMENT (AVR) (N/A)   Starting to wake on vent AAI paced with stable hemodynamics Chest tube output low UOP excellent Labs okay  Plan: Continue routine early postop  Justin [name] H 04/16/2011 6:14 PM

## 2011-04-16 NOTE — Progress Notes (Signed)
  Echocardiogram Echocardiogram Transesophageal has been performed.  Justin [name] 04/16/2011, 8:31 AM

## 2011-04-16 NOTE — Anesthesia Procedure Notes (Signed)
Procedure Name: Intubation Date/Time: 04/16/2011 7:50 AM Performed by: Wray Kearns A Pre-anesthesia Checklist: Patient identified, Timeout performed, Emergency Drugs available, Suction available and Patient being monitored Patient Re-evaluated:Patient Re-evaluated prior to inductionOxygen Delivery Method: Circle system utilized Preoxygenation: Pre-oxygenation with 100% oxygen Intubation Type: IV induction and Cricoid Pressure applied Ventilation: Mask ventilation without difficulty Laryngoscope Size: Mac and 4 Grade View: Grade I Tube type: Oral Tube size: 8.5 mm Number of attempts: 1 Airway Equipment and Method: Stylet Placement Confirmation: ETT inserted through vocal cords under direct vision,  breath sounds checked- equal and bilateral,  positive ETCO2 and CO2 detector Secured at: 23 cm Tube secured with: Tape Dental Injury: Teeth and Oropharynx as per pre-operative assessment

## 2011-04-16 NOTE — Anesthesia Postprocedure Evaluation (Signed)
Anesthesia Post Note  Patient: Justin [name]  Procedure(s) Performed: Procedure(s) (LRB): AORTIC VALVE REPLACEMENT (AVR) (N/A)  Anesthesia type: General  Patient location: ICU  Post pain: Pain level controlled  Post assessment: Post-op Vital signs reviewed  Last Vitals:  Filed Vitals:   04/16/11 1345  BP:   Pulse: 90  Temp: 36.2 C  Resp: 12    Post vital signs: stable  Level of consciousness: Patient remains intubated per anesthesia plan  Complications: No apparent anesthesia complications

## 2011-04-16 NOTE — Op Note (Signed)
CARDIOTHORACIC SURGERY OPERATIVE NOTE  Date of Procedure: 04/16/2011  Preoperative Diagnosis: Bicuspid Aortic Valve with Severe Aortic Insufficiency   Postoperative Diagnosis: Same   Procedure:   Aortic Valve Replacement   [employer] Magna Ease Pericardial Tissue Valve (size 27mm, model #3300TFX, serial [id number])   Surgeon: Salvatore Decent. 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 The patient reports that he was first noted to have a heart murmur many years ago and diagnosed with bicuspid aortic valve and aortic insufficiency approximately 10 years ago. He has been followed with serial echocardiograms since that time.  He recently was seen in followup by Dr. Andee Lineman. Because his previous echocardiogram was poor quality, he scheduled a transesophageal echocardiogram which was performed 2 weeks ago. This revealed severe aortic regurgitation. There was prolapse of aortic valve which appeared to be bicuspid and an eccentric jet of regurgitation that was quite severe. Ejection fraction was estimated 50-55%. There was mild mitral regurgitation. There is mild dilatation of the aortic root. There was mild left ventricular chamber enlargement. The patient was referred to consider elective aortic valve repair or replacement.  The patient has been seen in consultation and counseled at length regarding the indications, risks and potential benefits of surgery.  All questions have been answered, and the patient provides full informed consent for the operation as described.     DETAILS OF THE OPERATIVE PROCEDURE  The patient is brought to the operating room on the above mentioned date and  central monitoring was established by the anesthesia team including placement of Swan-Ganz catheter and radial arterial line. The patient is placed in the supine position on the operating table.  Intravenous antibiotics are administered. General endotracheal anesthesia is induced uneventfully. A Foley catheter is placed.  Baseline transesophageal echocardiogram was performed.  Findings were notable for bicuspid native valve with severe prolapse of the conjoined cusps and severe aortic insufficiency which was eccentrically directed.  There was normal LV systolic function with mild-moderate LV chamber enlargement.  The patient's chest, abdomen, both groins, and both lower extremities are prepared and draped in a sterile manner. A time out procedure is performed.  A median sternotomy incision was performed and the pericardium is opened. The ascending aorta is normal in appearance. The ascending aorta and the right atrium are cannulated for cardioplegia bypass.  Adequate heparinization is verified.   A retrograde cardioplegia cannula is placed through the right atrium into the coronary sinus.   The entire pre-bypass portion of the operation was notable for stable hemodynamics.  Cardiopulmonary bypass was begun and the surface of the heart is inspected.  A cardioplegia cannula is placed in the ascending aorta.  A temperature probe was placed in the interventricular septum.  The patient is cooled to 32C systemic temperature.  The aortic cross clamp is applied and cold blood cardioplegia is delivered initially in an antegrade fashion through the aortic root.   Supplemental cardioplegia is given retrograde through the coronary sinus catheter.  Iced saline slush is applied for topical hypothermia.  The initial cardioplegic arrest is rapid with early diastolic arrest.  Repeat doses of cardioplegia are administered intermittently throughout the entire cross clamp portion of the operation through the aortic root  and through the coronary sinus catheter in order to maintain completely flat electrocardiogram and septal myocardial temperature below 15C.  Myocardial protection was felt to be excellent.  An transverse aortotomy incision was performed.  The aortic valve was inspected and notable for bicuspid valve with fusion of the left and right cusps and severe prolapse of the conjoined cusps.  There was mild sclerosis and no significant calcification.  An attempt at valve repair was felt to be feasible.  A total of 3 4-0 Prolene sutures were placed at each of the commissures and used to retract the aortic valve to facilitate exposure. The free margins of the conjoined cusps were tacked to the free margin of the non-coronary cusp with interrupted 6-0 Prolene sutures to expose the amount of redundant leaflet of the conjoined cusp. The redundant portion of the conjoined cusp was resected using a triangular resection extending approximately 2/3 of the way towards the valve annulus along the raphae. The intervening vertical defect was closed with interrupted 6-0 Prolene sutures. A pledgeted 4-0 Prolene horizontal mattress suture was used to plicate each of the 2 commissures. The valve was fairly large and would easily accept a 23 mm valve sizer through the orifice after plication. The valve was now carefully examined. There appeared to be some redundancy of the non-coronary leaflet after the 6 of tacking sutures were removed. The free margin of the non-coronary leaflet was subsequently plicated with a running 6-0 Prolene suture. This juncture the valve appeared competent with saline injection.  The aortotomy was closed using running 4-0 Prolene 2 layer closure. One final dose of warm retrograde cardioplegia was administered and all air was evacuated through the aortic root. The aortic cross clamp is removed after a total cross clamp time of 59 minutes. The heart was cardioverted. Sinus rhythm resumed. There remains  significant vent return in the left ventricular vent. The lungs were ventilated and the heart allowed to fill all pump flows were turned down and transesophageal echocardiogram is examined. There remains at least moderate (2+) aortic insufficiency.  The aortic cross clamp is replaced and cardioplegia is administered retrograde to the point sinus catheter to achieve diastolic arrest. The aortotomy is reopened and cardioplegia is then administered using hand-held cannulas directly into the left main and the right coronary arteries until left ventricular myocardial temperature is below 15C.  The aortic valve leaflets were excised sharply and the aortic annulus decalcified.  Decalcification was notably minimal and straightforward.  The aortic annulus was sized to accept a 27 mm prosthesis.  The aortic root and left ventricle were irrigated with copious cold saline solution.  Aortic valve replacement was performed using interrupted horizontal mattress 2-0 Ethibond pledgeted sutures with pledgets in the subannular position.  An [facility] Ease pericardial tissue valve (size 27 mm, model # 3300TFX, serial # [id number]) was implanted uneventfully. The valve seated appropriately with adequate space beneath the left main and right coronary artery.  The aortotomy was closed using a 2-layer closure of running 4-0 Prolene suture.  One final dose of warm retrograde "hot shot" cardioplegia was administered retrograde through the coronary sinus catheter while all air was evacuated through the aortic root.  The aortic cross clamp was removed after a total cross second clamp time of 61 minutes, such that the combined crossclamp time for the entire operation was 120 minutes.  Epicardial pacing wires are fixed to the right ventricular outflow tract and to the right atrial appendage. The patient is rewarmed to 37C temperature. The  aortic and left ventricular vents are removed.  The patient is weaned and disconnected from  cardiopulmonary bypass.  The patient's rhythm at separation from bypass was normal sinus.  The patient was weaned from cardioplegic bypass without any inotropic support. Total cardiopulmonary bypass time for the operation was 152 minutes.  Followup transesophageal echocardiogram performed after separation from bypass revealed a well-seated aortic valve prosthesis that was functioning normally and without any sign of perivalvular leak.  Left ventricular function was unchanged from preoperatively.  The aortic and venous cannula were removed uneventfully. Protamine was administered to reverse the anticoagulation. The mediastinum and pleural space were inspected for hemostasis and irrigated with saline solution. The mediastinum was drained using 2 chest tubes placed through separate stab incisions inferiorly.  The soft tissues anterior to the aorta were reapproximated loosely. The sternum is closed with double strength sternal wire. The soft tissues anterior to the sternum were closed in multiple layers and the skin is closed with a running subcuticular skin closure.   The post-bypass portion of the operation was notable for stable rhythm and hemodynamics.   No blood products were administered during the operation.  The patient tolerated the procedure well and is transported to the surgical intensive care in stable condition. There are no intraoperative complications. All sponge instrument and needle counts are verified correct at completion of the operation.    Salvatore Decent. Cornelius Moras MD 04/16/2011 1:38 PM

## 2011-04-16 NOTE — Brief Op Note (Signed)
04/16/2011  12:41 PM  PATIENT:  Justin [name]  56 y.o. male  PRE-OPERATIVE DIAGNOSIS:  aortic insufficiency  POST-OPERATIVE DIAGNOSIS:  aortic insufficiency  PROCEDURE:  Procedure(s) (LRB): AORTIC VALVE REPLACEMENT (AVR) (N/A)  SURGEON:    Purcell Nails, MD  ASSISTANTS:  Al Corpus, CSFA  ANESTHESIA:   Aubery Lapping, MD  CROSSCLAMP TIME:   120' CARDIOPULMONARY BYPASS TIME: 152'  FINDINGS:  Bicuspid Native valve with severe aortic insufficiency  Normal LV systolic function  COMPLICATIONS: none  PATIENT DISPOSITION:   TO SICU IN STABLE CONDITION  Justin [name] H 04/16/2011 12:41 PM

## 2011-04-16 NOTE — Transfer of Care (Signed)
Immediate Anesthesia Transfer of Care Note  Patient: Justin [name]  Procedure(s) Performed: Procedure(s) (LRB): AORTIC VALVE REPLACEMENT (AVR) (N/A)  Patient Location: SICU room#2312  Anesthesia Type: General  Level of Consciousness: sedated and Patient remains intubated per anesthesia plan  Airway & Oxygen Therapy: Patient remains intubated per anesthesia plan and Patient placed on Ventilator (see vital sign flow sheet for setting)  Post-op Assessment: Report given to SICU NURSE,Amy,RN  Post vital signs: Reviewed and stable  Complications: No apparent anesthesia complications

## 2011-04-16 NOTE — Procedures (Signed)
Extubation Procedure Note  Patient Details:   Name: Justin [name] DOB: 01/09/1956 MRN: [id number]   Airway Documentation:   Pt extubated to 2L [place] at 1855. No stridor noted. BBS equal and dim. NIF -20, VC 1.0L  Evaluation  O2 sats: stable throughout and currently acceptable Complications: No apparent complications Patient did tolerate procedure well. Bilateral Breath Sounds: Clear;Diminished     Christie Beckers 04/16/2011, 6:59 PM

## 2011-04-16 NOTE — Preoperative (Signed)
Beta Blockers   Reason not to administer Beta Blockers:Pt took Metoprolol 12.5mg  PO @0556hr  on 04/16/2011

## 2011-04-16 NOTE — Interval H&P Note (Signed)
History and Physical Interval Note:  04/16/2011 7:19 AM  Justin [name]  has presented today for surgery, with the diagnosis of AORTIC INSUF.  The various methods of treatment have been discussed with the patient and family. After consideration of risks, benefits and other options for treatment, the patient has consented to  Procedure(s) (LRB): AORTIC VALVE REPLACEMENT (AVR) (N/A) as a surgical intervention .  The patients' history has been reviewed, patient examined, no change in status, stable for surgery.  I have reviewed the patients' chart and labs.  Questions were answered to the patient's satisfaction.     Justin [name] H

## 2011-04-17 ENCOUNTER — Encounter (HOSPITAL_COMMUNITY): Payer: Self-pay | Admitting: *Deleted

## 2011-04-17 ENCOUNTER — Inpatient Hospital Stay (HOSPITAL_COMMUNITY): Payer: 59

## 2011-04-17 LAB — CBC
Hemoglobin: 11 g/dL — ABNORMAL LOW (ref 13.0–17.0)
MCH: 31.9 pg (ref 26.0–34.0)
MCH: 32.2 pg (ref 26.0–34.0)
MCV: 89.2 fL (ref 78.0–100.0)
Platelets: 114 10*3/uL — ABNORMAL LOW (ref 150–400)
RBC: 3.45 MIL/uL — ABNORMAL LOW (ref 4.22–5.81)
RBC: 3.42 MIL/uL — ABNORMAL LOW (ref 4.22–5.81)
WBC: 11 10*3/uL — ABNORMAL HIGH (ref 4.0–10.5)

## 2011-04-17 LAB — BASIC METABOLIC PANEL
CO2: 23 mEq/L (ref 19–32)
Calcium: 8 mg/dL — ABNORMAL LOW (ref 8.4–10.5)
Potassium: 4.3 mEq/L (ref 3.5–5.1)
Sodium: 137 mEq/L (ref 135–145)

## 2011-04-17 LAB — GLUCOSE, CAPILLARY
Glucose-Capillary: 122 mg/dL — ABNORMAL HIGH (ref 70–99)
Glucose-Capillary: 142 mg/dL — ABNORMAL HIGH (ref 70–99)
Glucose-Capillary: 145 mg/dL — ABNORMAL HIGH (ref 70–99)

## 2011-04-17 LAB — MAGNESIUM: Magnesium: 2.3 mg/dL (ref 1.5–2.5)

## 2011-04-17 MED ORDER — FUROSEMIDE 10 MG/ML IJ SOLN
20.0000 mg | Freq: Four times a day (QID) | INTRAMUSCULAR | Status: AC
  Administered 2011-04-17 – 2011-04-18 (×3): 20 mg via INTRAVENOUS
  Filled 2011-04-17 (×3): qty 2

## 2011-04-17 MED ORDER — KETOROLAC TROMETHAMINE 30 MG/ML IJ SOLN
30.0000 mg | Freq: Four times a day (QID) | INTRAMUSCULAR | Status: AC
  Administered 2011-04-17 – 2011-04-18 (×4): 30 mg via INTRAVENOUS
  Filled 2011-04-17 (×5): qty 1

## 2011-04-17 MED ORDER — MIDAZOLAM HCL 2 MG/2ML IJ SOLN
2.0000 mg | Freq: Once | INTRAMUSCULAR | Status: AC
  Administered 2011-04-17: 2 mg via INTRAVENOUS
  Filled 2011-04-17: qty 2

## 2011-04-17 MED ORDER — INSULIN ASPART 100 UNIT/ML ~~LOC~~ SOLN: 0.0000 [IU] | SUBCUTANEOUS | Status: DC

## 2011-04-17 NOTE — Progress Notes (Signed)
   CARDIOTHORACIC SURGERY PROGRESS NOTE   R1 Day Post-Op Procedure(s) (LRB): AORTIC VALVE REPLACEMENT (AVR) (N/A)  Subjective: Looks good.  Mild soreness in chest.  Objective: Vital signs: BP Readings from Last 1 Encounters:  04/16/11 122/80   Pulse Readings from Last 1 Encounters:  04/17/11 90   Resp Readings from Last 1 Encounters:  04/17/11 16   Temp Readings from Last 1 Encounters:  04/17/11 99.3 F (37.4 C)     Hemodynamics: PAP: (19-39)/(10-22) 34/15 mmHg CO:  [4.5 L/min-7.6 L/min] 5.2 L/min CI:  [2.2 L/min/m2-3.7 L/min/m2] 2.3 L/min/m2  Physical Exam:  Rhythm:   sinus  Breath sounds: clear  Heart sounds:  RRR  Incisions:  Dressings dry  Abdomen:  Soft, non distended, non tender  Extremities:  Warm, well perfused   Intake/Output from previous day: 03/08 0701 - 03/09 0700 In: 5583.3 [I.V.:4753.3; Blood:400; NG/GT:30; IV Piggyback:400] Out: 4845 [Urine:2975; Blood:1400; Chest Tube:470] Intake/Output this shift:    Lab Results:  Basename 04/17/11 0504 04/16/11 1846 04/16/11 1840  WBC 11.0* -- 18.9*  HGB 11.0* 11.2* --  HCT 30.1* 33.0* --  PLT 114* -- 156   BMET:  Basename 04/17/11 0504 04/16/11 1846 04/14/11 1133  NA 137 138 --  K 4.3 4.1 --  CL 108 110 --  CO2 23 -- 22  GLUCOSE 144* 156* --  BUN 14 13 --  CREATININE 0.98 1.10 --  CALCIUM 8.0* -- 9.3    CBG (last 3)   Basename 04/17/11 0723 04/17/11 0404 04/17/11 0001  GLUCAP 138* 142* 145*   ABG    Component Value Date/Time   PHART 7.435 04/16/2011 1840   HCO3 19.1* 04/16/2011 1840   TCO2 19 04/16/2011 1846   ACIDBASEDEF 4.0* 04/16/2011 1840   O2SAT 99.0 04/16/2011 1840   CXR: clear  Assessment/Plan: S/P Procedure(s) (LRB): AORTIC VALVE REPLACEMENT (AVR) (N/A)  Doing well POD1 Expected post op acute blood loss anemia, mild, stable Expected post op volume excess, mild   Mobilize  Diuresis  D/C lines  Routine postop  Antonette Hendricks H 04/17/2011 8:23 AM

## 2011-04-17 NOTE — Progress Notes (Signed)
TCTS BRIEF SICU PROGRESS NOTE  1 Day Post-Op  S/P Procedure(s) (LRB): AORTIC VALVE REPLACEMENT (AVR) (N/A)   Stable day.  Ambulated around SICU NSR BP stable off drips UOP 30-50 mL/hr  Plan: Continue routine care.  Will start lasix  Princes Finger H 04/17/2011 6:28 PM

## 2011-04-18 ENCOUNTER — Inpatient Hospital Stay (HOSPITAL_COMMUNITY): Payer: 59

## 2011-04-18 LAB — GLUCOSE, CAPILLARY
Glucose-Capillary: 167 mg/dL — ABNORMAL HIGH (ref 70–99)
Glucose-Capillary: 101 mg/dL — ABNORMAL HIGH (ref 70–99)

## 2011-04-18 LAB — CBC
MCH: 31.3 pg (ref 26.0–34.0)
MCV: 89.1 fL (ref 78.0–100.0)
Platelets: 104 10*3/uL — ABNORMAL LOW (ref 150–400)
RBC: 3.2 MIL/uL — ABNORMAL LOW (ref 4.22–5.81)
RDW: 13.7 % (ref 11.5–15.5)

## 2011-04-18 LAB — BASIC METABOLIC PANEL
CO2: 25 mEq/L (ref 19–32)
Calcium: 7.9 mg/dL — ABNORMAL LOW (ref 8.4–10.5)
Creatinine, Ser: 1.17 mg/dL (ref 0.50–1.35)
Glucose, Bld: 117 mg/dL — ABNORMAL HIGH (ref 70–99)

## 2011-04-18 MED ORDER — POTASSIUM CHLORIDE 10 MEQ/50ML IV SOLN
10.0000 meq | INTRAVENOUS | Status: AC
  Administered 2011-04-18 (×3): 10 meq via INTRAVENOUS

## 2011-04-18 MED ORDER — SODIUM CHLORIDE 0.9 % IJ SOLN
3.0000 mL | Freq: Two times a day (BID) | INTRAMUSCULAR | Status: DC
  Administered 2011-04-18 – 2011-04-20 (×5): 3 mL via INTRAVENOUS

## 2011-04-18 MED ORDER — POTASSIUM CHLORIDE 10 MEQ/50ML IV SOLN
10.0000 meq | INTRAVENOUS | Status: AC
  Administered 2011-04-18 (×2): 10 meq via INTRAVENOUS

## 2011-04-18 MED ORDER — TRAMADOL HCL 50 MG PO TABS: 50.0000 mg | ORAL_TABLET | ORAL | Status: DC | PRN

## 2011-04-18 MED ORDER — SODIUM CHLORIDE 0.9 % IV SOLN: 250.0000 mL | INTRAVENOUS | Status: DC | PRN

## 2011-04-18 MED ORDER — POTASSIUM CHLORIDE 10 MEQ/50ML IV SOLN
INTRAVENOUS | Status: AC
  Filled 2011-04-18: qty 50

## 2011-04-18 MED ORDER — DOCUSATE SODIUM 100 MG PO CAPS
200.0000 mg | ORAL_CAPSULE | Freq: Every day | ORAL | Status: DC
  Administered 2011-04-19 – 2011-04-20 (×2): 200 mg via ORAL
  Filled 2011-04-18 (×2): qty 2

## 2011-04-18 MED ORDER — MOVING RIGHT ALONG BOOK
Freq: Once | Status: AC
  Administered 2011-04-19: 07:00:00
  Filled 2011-04-18: qty 1

## 2011-04-18 MED ORDER — SODIUM CHLORIDE 0.9 % IJ SOLN: 3.0000 mL | INTRAMUSCULAR | Status: DC | PRN

## 2011-04-18 NOTE — Progress Notes (Signed)
   CARDIOTHORACIC SURGERY PROGRESS NOTE   R2 Days Post-Op Procedure(s) (LRB): AORTIC VALVE REPLACEMENT (AVR) (N/A)  Subjective: Feels nauseated this morning but otherwise okay  Objective: Vital signs: BP Readings from Last 1 Encounters:  04/18/11 89/66   Pulse Readings from Last 1 Encounters:  04/18/11 84   Resp Readings from Last 1 Encounters:  04/18/11 14   Temp Readings from Last 1 Encounters:  04/18/11 98.5 F (36.9 C) Oral    Hemodynamics:    Physical Exam:  Rhythm:   sinus  Breath sounds: clear  Heart sounds:  RRR  Incisions:  Clean and dry  Abdomen:  Soft, non distended, non tender  Extremities:  Warm, well perfused   Intake/Output from previous day: 03/09 0701 - 03/10 0700 In: 2044 [P.O.:1380; I.V.:360; IV Piggyback:304] Out: 1775 [Urine:1725; Chest Tube:50] Intake/Output this shift: Total I/O In: 240 [P.O.:240] Out: 800 [Urine:800]  Lab Results:  [facility] 04/18/11 0351 04/17/11 1620  WBC 10.3 12.1*  HGB 10.0* 11.0*  HCT 28.5* 30.5*  PLT 104* 115*   BMET:  Basename 04/18/11 0351 04/17/11 1620 04/17/11 0504  NA 136 -- 137  K 3.6 -- 4.3  CL 104 -- 108  CO2 25 -- 23  GLUCOSE 117* -- 144*  BUN 16 -- 14  CREATININE 1.17 1.19 --  CALCIUM 7.9* -- 8.0*    CBG (last 3)   Basename 04/18/11 0830 04/18/11 0254 04/17/11 2351  GLUCAP 167* 130* 122*   ABG    Component Value Date/Time   PHART 7.435 04/16/2011 1840   HCO3 19.1* 04/16/2011 1840   TCO2 19 04/16/2011 1846   ACIDBASEDEF 4.0* 04/16/2011 1840   O2SAT 99.0 04/16/2011 1840   CXR: clear  Assessment/Plan: S/P Procedure(s) (LRB): AORTIC VALVE REPLACEMENT (AVR) (N/A)  Doing well POD2 Expected post op acute blood loss anemia, mild, stable Expected post op volume excess, mild, diuresed very well yesterday and today BP 95-100   Mobilize  Hold lasix  Transfer stepdown   Shyrl Obi H 04/18/2011 10:35 AM

## 2011-04-19 ENCOUNTER — Encounter (HOSPITAL_COMMUNITY): Payer: Self-pay | Admitting: Thoracic Surgery (Cardiothoracic Vascular Surgery)

## 2011-04-19 ENCOUNTER — Inpatient Hospital Stay (HOSPITAL_COMMUNITY): Payer: 59

## 2011-04-19 LAB — BASIC METABOLIC PANEL
Calcium: 8.4 mg/dL (ref 8.4–10.5)
Creatinine, Ser: 1.04 mg/dL (ref 0.50–1.35)
GFR calc non Af Amer: 80 mL/min — ABNORMAL LOW (ref >90)
Glucose, Bld: 114 mg/dL — ABNORMAL HIGH (ref 70–99)
Sodium: 138 mEq/L (ref 135–145)

## 2011-04-19 LAB — CBC
Hemoglobin: 10.4 g/dL — ABNORMAL LOW (ref 13.0–17.0)
MCH: 32.1 pg (ref 26.0–34.0)
MCHC: 35.9 g/dL (ref 30.0–36.0)

## 2011-04-19 MED ORDER — ASPIRIN 325 MG PO TBEC: 325.0000 mg | DELAYED_RELEASE_TABLET | Freq: Every day | ORAL | Status: DC

## 2011-04-19 MED ORDER — METOPROLOL TARTRATE 25 MG PO TABS
25.0000 mg | ORAL_TABLET | Freq: Two times a day (BID) | ORAL | Status: DC
  Administered 2011-04-19: 25 mg via ORAL
  Filled 2011-04-19 (×4): qty 1

## 2011-04-19 MED ORDER — METOPROLOL TARTRATE 25 MG/10 ML ORAL SUSPENSION
25.0000 mg | Freq: Two times a day (BID) | ORAL | Status: DC
  Administered 2011-04-19: 25 mg
  Filled 2011-04-19 (×4): qty 10

## 2011-04-19 MED ORDER — OXYCODONE HCL 5 MG PO TABS: 5.0000 mg | ORAL_TABLET | ORAL | Status: AC | PRN

## 2011-04-19 MED ORDER — METOPROLOL TARTRATE 25 MG PO TABS: 25.0000 mg | ORAL_TABLET | Freq: Two times a day (BID) | ORAL | Status: DC

## 2011-04-19 MED FILL — Sodium Bicarbonate IV Soln 8.4%: INTRAVENOUS | Qty: 50 | Status: AC

## 2011-04-19 MED FILL — Lidocaine HCl IV Inj 20 MG/ML: INTRAVENOUS | Qty: 10 | Status: AC

## 2011-04-19 MED FILL — Sodium Chloride IV Soln 0.9%: INTRAVENOUS | Qty: 1000 | Status: AC

## 2011-04-19 MED FILL — Magnesium Sulfate Inj 50%: INTRAMUSCULAR | Qty: 10 | Status: AC

## 2011-04-19 MED FILL — Potassium Chloride Inj 2 mEq/ML: INTRAVENOUS | Qty: 40 | Status: AC

## 2011-04-19 MED FILL — Heparin Sodium (Porcine) Inj 1000 Unit/ML: INTRAMUSCULAR | Qty: 30 | Status: AC

## 2011-04-19 MED FILL — Electrolyte-R (PH 7.4) Solution: INTRAVENOUS | Qty: 4000 | Status: AC

## 2011-04-19 MED FILL — Mannitol IV Soln 20%: INTRAVENOUS | Qty: 500 | Status: AC

## 2011-04-19 MED FILL — Heparin Sodium (Porcine) Inj 1000 Unit/ML: INTRAMUSCULAR | Qty: 10 | Status: AC

## 2011-04-19 MED FILL — Nitroglycerin IV Soln 5 MG/ML: INTRAVENOUS | Qty: 10 | Status: AC

## 2011-04-19 MED FILL — Lactated Ringer's Solution: INTRAVENOUS | Qty: 500 | Status: AC

## 2011-04-19 MED FILL — Sodium Chloride Irrigation Soln 0.9%: Qty: 3000 | Status: AC

## 2011-04-19 MED FILL — Verapamil HCl IV Soln 2.5 MG/ML: INTRAVENOUS | Qty: 4 | Status: AC

## 2011-04-19 NOTE — Progress Notes (Signed)
CARDIAC REHAB PHASE I   PRE:  Rate/Rhythm: 105 ST    BP: sitting 112/81    SaO2: 97 RA  MODE:  Ambulation: 550 ft   POST:  Rate/Rhythm: 118 ST    BP: sitting 128/70     SaO2: 98 RA  Tolerated well with RW, assist x1. Steady. HR elevated today. No c/o. [id number]  Harriet Masson CES, ACSM

## 2011-04-19 NOTE — Progress Notes (Signed)
TCTS BRIEF PROGRESS NOTE   Justin [name] has had an excellent day.  Tentatively for d/c home in am tomorrow.  Justin [name],Justin [name] 04/19/2011 6:24 PM

## 2011-04-19 NOTE — Progress Notes (Addendum)
                    [address]            Justin [name] [id number]          [phone]     3 Days Post-Op Procedure(s) (LRB): AORTIC VALVE REPLACEMENT (AVR) (N/A)  Subjective: Just back from walking with CRPI.  Nausea resolved and pt feeling better overall.  Objective: Vital signs in last 24 hours: Patient Vitals for the past 24 hrs:  BP Temp Temp src Pulse Resp SpO2 Weight  04/19/11 0504 102/75 mmHg 100.5 F (38.1 C) Oral 109  18  90 % -  04/19/11 0100 - - - - - - 86.682 kg (191 lb 1.6 oz)  04/18/11 2353 - 99.2 F (37.3 C) - - - - -  04/18/11 2016 108/73 mmHg 100 F (37.8 C) Oral 100  18  99 % -  04/18/11 1800 90/67 mmHg - - - 18  - -  04/18/11 1700 93/60 mmHg - - - 18  - -  04/18/11 1607 - 98.6 F (37 C) Oral - - - -  04/18/11 1600 88/71 mmHg - - 94  15  95 % -  04/18/11 1500 91/70 mmHg - - 91  14  95 % -  04/18/11 1400 100/77 mmHg - - 93  14  93 % -  04/18/11 1321 - 98.9 F (37.2 C) Oral - - - -  04/18/11 1300 91/66 mmHg - - 96  17  92 % -  04/18/11 1200 93/60 mmHg - - 89  13  93 % -  04/18/11 1100 90/62 mmHg - - 87  13  93 % -  04/18/11 1000 87/69 mmHg - - 81  14  94 % -  04/18/11 0900 89/66 mmHg - - 84  14  94 % -  04/18/11 0832 - 98.5 F (36.9 C) Oral - - - -   Current Weight  04/19/11 86.682 kg (191 lb 1.6 oz)  Pre-op wt= 85.8 kg   Intake/Output from previous day: 03/10 0701 - 03/11 0700 In: 420 [P.O.:420] Out: 2450 [Urine:2250; Emesis/NG output:200]    PHYSICAL EXAM:  Heart: RRR, HR low 100s Lungs: clear Wound: clean and dry Extremities: minimal LE edema  Lab Results: CBC: Basename 04/19/11 0610 04/18/11 0351  WBC 7.8 10.3  HGB 10.4* 10.0*  HCT 29.0* 28.5*  PLT 117* 104*   BMET:  Basename 04/19/11 0610 04/18/11 0351  NA 138 136  K 3.7 3.6  CL 105 104  CO2 27 25  GLUCOSE 114* 117*  BUN 12 16  CREATININE 1.04 1.17  CALCIUM 8.4 7.9*    PT/INR:  Basename 04/16/11 1315  LABPROT 18.8*  INR 1.54*   CXR- stable L  basilar atx, small effusions  Assessment/Plan: S/P Procedure(s) (LRB): AORTIC VALVE REPLACEMENT (AVR) (N/A) CV- BPs borderline 80-100.  HRs slightly elevated.  Continue low dose beta blocker as tolerated. Continue CRPI, pulm toilet.   Possibly home 1-2 days if remains stable.   LOS: 3 days    Justin [name],Justin [name] 04/19/2011   I have seen and examined the patient and agree with the assessment and plan as outlined.  Will increase metoprolol.  Justin [name] [name] 04/19/2011 9:04 AM

## 2011-04-19 NOTE — Progress Notes (Signed)
Patient ambulating independently in hallway with wife. Patient tolerated walk well and had no complaints at end of walk. Walked entire unit. Nickolas Madrid

## 2011-04-19 NOTE — Discharge Instructions (Signed)
Aortic Valve Replacement Care After Read the instructions outlined below and refer to this sheet for the next few weeks. These discharge instructions provide you with general information on caring for yourself after you leave the hospital. Your surgeon may also give you specific instructions. While your treatment has been planned according to the most current medical practices available, unavoidable complications occasionally occur. If you have any problems or questions after discharge, please call your surgeon. AFTER THE PROCEDURE  Full recovery from heart valve surgery can take several months.   Blood thinning (anticoagulation) treatment with warfarin is often prescribed for 6 weeks to 3 months after surgery for those with biological valves. It is prescribed for life for those with mechanical valves.   Recovery includes healing of the surgical incision. There is a gradual building of stamina and exercise abilities. An exercise program under the direction of a physical therapist may be recommended.   Once you have an artificial valve, your heart function and your life will return to normal. You usually feel better after surgery. Shortness of breath and fatigue should lessen. If your heart was already severely damaged before your surgery, you may continue to have problems.   You can usually resume most of your normal activities. You will have to continue to monitor your condition. You need to watch out for blood clots and infections.   Artificial valves need to be replaced after a period of time. It is important that you see your caregiver regularly.   Some individuals with an aortic valve replacement need to take antibiotics before having dental work or other surgical procedures. This is called prophylactic antibiotic treatment. These drugs help to prevent infective endocarditis. Antibiotics are only recommended for individuals with the highest risk for developing infective endocarditis. Let your  dentist and your caregiver know if you have a history of any of the following so that the necessary precautions can be taken:   A VSD.   A repaired VSD.   Endocarditis in the past.   An artificial (prosthetic) heart valve.  HOME CARE INSTRUCTIONS   Use all medications as prescribed.   Take your temperature every morning for the first week after surgery. Record these.   Weigh yourself every morning for at least the first week after surgery and record.   Do not lift more than 10 pounds (4.5 kg) until your breastbone (sternum) has healed. Avoid all activities which would place strain on your incision.   You may shower as soon as directed by your caregiver after surgery. Pat incisions dry. Do not rub incisions with washcloth or towel.   Avoid driving for 4 to 6 weeks following surgery or as instructed.   Use your elastic stockings during the day. You should wear the stockings for at least 2 weeks after discharge or longer if your ankles are swollen. The stockings help blood flow and help reduce swelling in the legs. It is easiest to put the stockings on before you get out of bed in the morning. They should fit snugly.  Pain Control  If a prescription was given for a pain reliever, please follow your doctor's directions.   If the pain is not relieved by your medicine, becomes worse, or you have difficulty breathing, call your surgeon.  Activity  Take frequent rest periods throughout the day.   Wait one week before returning to strenuous activities such as heavy lifting (more than 10 pounds), pushing or pulling.   Talk with your doctor about when you may   return to work and your exercise routine.   Do not drive while taking prescription pain medication.  Nutrition  You may resume your normal diet.   Drink plenty of fluids (6-8 glasses a day).   Eat a well-balanced diet.   Call your caregiver for persistent nausea or vomiting.  Elimination Your normal bowel function should  return. If constipation should occur, you may:  Take a mild laxative.   Add fruit and bran to your diet.   Drink more fluids.   Call your doctor if constipation is not relieved.  SEEK IMMEDIATE MEDICAL CARE IF:   You develop chest pain which is not coming from your surgical cut (incision).   You develop shortness of breath or have difficulty breathing.   You develop a temperature over 101 F (38.3 C).   You have a sudden weight gain. Let your caregiver know what the weight gain is.   You develop a rash.   You develop any reaction or side effects to medications given.   You have increased bleeding from wounds.   You see redness, swelling, or have increasing pain in wounds.   You have pus coming from your wound.   You develop lightheadedness or feel faint.  Document Released: 08/13/2004 Document Revised: 01/14/2011 Document Reviewed: 11/04/2004 [employer] Patient Information 2012 [employer]. 

## 2011-04-20 DIAGNOSIS — I319 Disease of pericardium, unspecified: Secondary | ICD-10-CM

## 2011-04-20 MED ORDER — METOPROLOL TARTRATE 50 MG PO TABS: 50.0000 mg | ORAL_TABLET | Freq: Two times a day (BID) | ORAL | Status: DC

## 2011-04-20 MED ORDER — METOPROLOL TARTRATE 50 MG PO TABS
50.0000 mg | ORAL_TABLET | Freq: Two times a day (BID) | ORAL | Status: DC
  Administered 2011-04-20: 50 mg via ORAL
  Filled 2011-04-20 (×2): qty 1

## 2011-04-20 NOTE — Progress Notes (Signed)
Pt. Discharged 04/20/2011  4:11 PM Discharge instructions reviewed with patient/family. Incisional care and when to call md verbalized. Patient/family verbalized understanding. All Rx's given. Questions answered as needed. Pt. Discharged to home with family/self.  Nickolas Madrid

## 2011-04-20 NOTE — Progress Notes (Signed)
CARDIAC REHAB PHASE I   PRE:  Rate/Rhythm: 101 ST    BP: sitting left 119/88, right 121/94    SaO2: 99 RA  MODE:  Ambulation: 940 ft   POST:  Rate/Rhythm: 110 ST    BP: sitting 123/90     SaO2: 99 RA  Tolerated very well. HR stable at 110 ST walking. BP elevated before and after walk. No c/o. Ed completed with pt and wife. Requests his name be sent to [facility].  [phone]  Harriet Masson CES, ACSM

## 2011-04-20 NOTE — Progress Notes (Signed)
UR Completed.  Justin [name] Jane [phone] 04/20/2011  

## 2011-04-20 NOTE — Progress Notes (Addendum)
[address]            [place] [address]          [phone]     4 Days Post-Op  Procedure(s) (LRB): AORTIC VALVE REPLACEMENT (AVR) (N/A) Subjective: Feels well  Objective  Telemetry sinus tachy, 120's   Temp:  [98.2 F (36.8 C)-99.4 F (37.4 C)] 98.2 F (36.8 C) (03/12 0425) Pulse Rate:  [88-103] 103  (03/12 0425) Resp:  [18] 18  (03/12 0425) BP: (98-110)/(75-80) 107/77 mmHg (03/12 0425) SpO2:  [93 %-97 %] 96 % (03/12 0425) Weight:  [188 lb (85.276 kg)] 188 lb (85.276 kg) (03/12 0425)   Intake/Output Summary (Last 24 hours) at 04/20/11 0758 Last data filed at 04/19/11 1230  Gross per 24 hour  Intake    240 ml  Output      0 ml  Net    240 ml       General appearance: alert and no distress Heart: regular rate and rhythm, S1, S2 normal and no murmur, tachy Lungs: clear to auscultation bilaterally Abdomen: benign exam Extremities: no LE edema Wound: incisions healing well  Lab Results:  Basename 04/19/11 0610 04/18/11 0351 04/17/11 1620  NA 138 136 --  K 3.7 3.6 --  CL 105 104 --  CO2 27 25 --  GLUCOSE 114* 117* --  BUN 12 16 --  CREATININE 1.04 1.17 --  CALCIUM 8.4 7.9* --  MG -- -- 2.3  PHOS -- -- --   No results found for this basename: AST:2,ALT:2,ALKPHOS:2,BILITOT:2,PROT:2,ALBUMIN:2 in the last 72 hours No results found for this basename: LIPASE:2,AMYLASE:2 in the last 72 hours  Basename 04/19/11 0610 04/18/11 0351  WBC 7.8 10.3  NEUTROABS -- --  HGB 10.4* 10.0*  HCT 29.0* 28.5*  MCV 89.5 89.1  PLT 117* 104*   No results found for this basename: CKTOTAL:4,CKMB:4,TROPONINI:4 in the last 72 hours No components found with this basename: POCBNP:3 No results found for this basename: DDIMER in the last 72 hours No results found for this basename: HGBA1C in the last 72 hours No results found for this basename: CHOL,HDL,LDLCALC,TRIG,CHOLHDL in the last 72 hours No results found for this basename:  TSH,T4TOTAL,FREET3,T3FREE,THYROIDAB in the last 72 hours No results found for this basename: VITAMINB12,FOLATE,FERRITIN,TIBC,IRON,RETICCTPCT in the last 72 hours  Medications: Scheduled    . acetaminophen  1,000 mg Oral Q6H   Or  . acetaminophen (TYLENOL) oral liquid 160 mg/5 mL  975 mg Per Tube Q6H  . aspirin EC  325 mg Oral Daily   Or  . aspirin  324 mg Per Tube Daily  . bisacodyl  10 mg Oral Daily   Or  . bisacodyl  10 mg Rectal Daily  . docusate sodium  200 mg Oral Daily  . metoprolol tartrate  25 mg Oral BID   Or  . metoprolol tartrate  25 mg Per Tube BID  . pantoprazole  40 mg Oral Q1200  . pyridoxine  100 mg Oral Daily  . sodium chloride  3 mL Intravenous Q12H  . vitamin B-12  1,000 mcg Oral Daily  . DISCONTD: metoprolol tartrate  12.5 mg Per Tube BID  . DISCONTD: metoprolol tartrate  12.5 mg Oral BID     Radiology/Studies:  Dg Chest 2 View  04/19/2011  *RADIOLOGY REPORT*  Clinical Data: Postop from aortic valve replacement.  Follow-up atelectasis.  CHEST - 2 VIEW  Comparison: 04/18/2011  Findings: Persistent low lung volumes noted with persistent atelectasis or infiltrate in the left retrocardiac lung base.  Tiny pleural effusions noted bilaterally.  Cardiomegaly stable.  No evidence of congestive heart failure.  Previous mediasternotomy and aortic valve replacement noted.  IMPRESSION: Persistent low lung volumes, with left retrocardiac atelectasis or infiltrate and tiny bilateral pleural effusions.  Original Report Authenticated By: Danae Orleans, M.D.    INR: Will add last result for INR, ABG once components are confirmed Will add last 4 CBG results once components are confirmed  Assessment/Plan: S/P Procedure(s) (LRB): AORTIC VALVE REPLACEMENT (AVR) (N/A) 1. Doing well, need to control HR better, increase beta blocker. Possible home later today vs am   LOS: 4 days    [name],[name] 3/12/20137:58 AM    I have seen and examined the patient and agree with the  assessment and plan as outlined.  Rhythm is clearly sinus tach.  Will increase beta blocker.  Check 2D ECHO to r/o pericardial effusion.  If ECHO okay will d/c home later today.  Justin [name] 04/20/2011 8:44 AM

## 2011-04-20 NOTE — Progress Notes (Signed)
  Echocardiogram 2D Echocardiogram has been performed.  Justin [name] L 04/20/2011, 3:42 PM

## 2011-04-20 NOTE — Progress Notes (Signed)
EPW discontinued per protocol. Tips intact. Patient tolerated well.   Patient advised Bedrest X 1 hour. Justin [name]

## 2011-04-21 NOTE — Discharge Summary (Signed)
I agree with the above discharge summary and plan for follow-up.  Justin [name] H  

## 2011-04-21 NOTE — Discharge Summary (Signed)
[address]            [place] [address]          [phone]      KEIDAN AUMILLER Jun 26, 1955 56 y.o. [id number]  04/16/2011 04/20/2011  No att. providers found  AORTIC INSUF.  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 The patient reports that he was first noted to have a heart murmur many years ago and diagnosed with bicuspid aortic valve and aortic insufficiency approximately 10 years ago. He has been followed with serial echocardiograms since that time. He recently was seen in followup by Dr. Andee Lineman. Because his previous echocardiogram was poor quality, he scheduled a transesophageal echocardiogram which was performed 2 weeks ago. This revealed severe aortic regurgitation. There was prolapse of aortic valve which appeared to be bicuspid and an eccentric jet of regurgitation that was quite severe. Ejection fraction was estimated 50-55%. There was mild mitral regurgitation. There is mild dilatation of the aortic root. There was mild left ventricular chamber enlargement. The patient was referred to consider elective aortic valve repair or replacement.  Family History   Problem  Relation  Age of Onset   .  Cancer  Father  60    Social History  History   Substance Use Topics   .  Smoking status:  Never Smoker   .  Smokeless tobacco:  Never Used   .  Alcohol Use:  Yes      Occasionally--socially    No current facility-administered medications for this encounter.    Current Outpatient Prescriptions   Medication  Sig  Dispense  Refill   .  Cholecalciferol (VITAMIN D3) 2000 UNITS TABS  Take 1 tablet by mouth daily.     .  Multiple Vitamins-Minerals (CENTRUM SILVER PO)  Take 1 tablet by mouth daily.     .  Olmesartan-Amlodipine-HCTZ (TRIBENZOR) 40-5-25 MG TABS  Take 1 tablet by mouth daily.  30 tablet  6   .  Omega-3 Fatty Acids (FISH OIL CONCENTRATE PO)  Take 1 tablet  by mouth daily.     Marland Kitchen  pyridoxine (B-6) 100 MG tablet  Take 100 mg by mouth daily.     .  vitamin B-12 (CYANOCOBALAMIN) 1000 MCG tablet  Take 1,000 mcg by mouth daily.      No Known Allergies  Review of Systems: At time of consultation General: normal appetite, normal energy  Respiratory: no cough, no wheezing, no hemoptysis, no pain with inspiration or cough, no shortness of breath  Cardiac: no chest pain or tightness, no exertional SOB, no resting SOB, no PND, no orthopnea, no LE edema, no palpitations, no syncope  GI: no difficulty swallowing, no hematochezia, no hematemesis, no melena, no constipation, no diarrhea  GU: no dysuria, no urgency, no frequency  Musculoskeletal: no arthritis, no arthralgia other than some pain both ankles with ambulation  Vascular: no pain suggestive of claudication  Neuro: no symptoms suggestive of TIA's, no seizures, no headaches, no peripheral neuropathy  Endocrine: Negative  HEENT: no loose teeth or painful teeth, sees his dentist regularly, no recent vision changes  Psych: no anxiety, no depression  Physical Exam: At time of consultation BP 111/61  Pulse 75  Resp 20  Ht 5\' 6"  (1.676 m)  Wt 189 lb (85.73 kg)  BMI 30.51 kg/m2  SpO2 95%  General: well-appearing  HEENT: Unremarkable  Neck: no JVD, no bruits, no adenopathy  Chest: clear to auscultation, symmetrical breath sounds, no wheezes, no rhonchi  CV: RRR, grade III/VI diastolic murmur  Abdomen: soft, non-tender, no masses  Extremities: warm, well-perfused, pulses palpable  Rectal/GU Deferred  Neuro: Grossly non-focal and symmetrical throughout  Skin: Clean and dry, no rashes, no breakdown  Diagnostic Tests: Preop Transesophageal ECHO performed 02/24/2011 at [facility] and he is reviewed. This demonstrates what appears to be bicuspid aortic valve with severe prolapse and severe aortic insufficiency. There is mild left ventricular chamber enlargement but left ventricular systolic function  is fairly normal with ejection fraction estimated between 50 and 55%. There is mild mitral regurgitation. No other significant abnormalities are noted.  Cardiac Catheterization Procedure Note  Name: KHALIF STENDER  MRN: [id number]  DOB: 1955/04/18  Procedure: Right Heart Cath, Left Heart Cath, Selective Coronary Angiography, LV angiography, AO root, Distal AO  Indication:  Procedural Details: The right groin was prepped, draped, and anesthetized with 1% lidocaine. Using the modified Seldinger technique a 4 French sheath was placed in the left femoral artery and a 7 French sheath was placed in the left femoral vein. A Swan-Ganz catheter was used for the right heart catheterization. Standard protocol was followed for recording of right heart pressures and sampling of oxygen saturations. Fick cardiac output was calculated. Standard Judkins catheters were used for selective coronary angiography and left ventriculography. There were no immediate procedural complications. The patient was transferred to the post catheterization recovery area for further monitoring.  Procedural Findings:  Hemodynamics:  RA 3  RV 22/6  PA mean 12  PCWP 7  LV 90/12  AO 94/65  Oxygen saturations:  PA 94  AO 57  Cardiac Output (Fick) 3.6 Cardiac Index (Fick) 1.9  Coronary angiography:  Coronary dominance: right  Left mainstem: Normal  Left anterior descending (LAD): Wraps the apex. Normal. Mid diagonal large and normal.  Left circumflex (LCx): AV groove normal. RI moderate sized normal. MOM large and normal. PL small to moderate sized and normal.  Right coronary artery (RCA): Mid to distal long 50 - 60% diffuse narrowing. Very small PDA and PL distal to this.  Left ventriculography: Left ventricular systolic function is normal, LVEF is estimated at 65%. Ventricular ectopy precludes adequate analysis.  AO root: There appears to be mild dilatation. There is severe 4+ AI.  AO distal: Normal  Final Conclusions: Severe AI  with moderate RCA stenosis. Preserved EF.  Recommendations: The patient has follow up with Dr. Cornelius Moras and a planned CT to further plan aortic valve surgery.  Rollene Rotunda  03/25/2011, 9:15 AM    CT ANGIOGRAPHY OF THE HEART  FINDINGS:  Technical quality: Excellent  CORONARY ARTERIES:  Left main coronary artery: Negative.  Left anterior descending: Negative.  Ramus intermedius: Negative.  Left circumflex: Negative.  Right coronary artery: Negative.  Posterior descending artery: Patent.  Dominance: Codominant.  CORONARY CALCIUM:  Total Agatston Score: 0  MESA database percentile: N/A  CARDIAC FUNCTION:  Ejection fraction: 67%  End-diastolic volume: 010 ml  End-systolic volume: 932 ml  Stroke volume: 262 ml  Wall motion analysis: No regional wall motion abnormalities.  CARDIAC MEASUREMENTS:  Interventricular septum (6 - 12 mm): 19 mm  LV posterior wall (6 - 12 mm): 12 mm  LV diameter in diastole (35 - 52 mm): 62 mm  LV diameter at end systole (21 - 40 mm): 43 mm  LA diameter at end  systole (19 - 40 mm): 40 mm  AORTIC ROOT:  Aortic Valve Description: There are three thickened cusp moieties  and two commissures with an intervening median raphe very between  the left and right cusp moieties, consistent with a type 1 bicuspid  aortic valve (i.e., functionally bicuspid). Some calcification of  the fused cusp is noted (the left cusp moiety). There is  significant prolapse of the fused cusp moiety during diastole, with  a regurgitant orifice area of approximately 89 mm sq estimated by  planimetry, indicative of severe aortic insufficiency. During  systole, there is a normal aortic valve area (estimated at 5.3 cm  sq estimated by planimetry).  Aortic Valve Area: 5.3 cm-sq  Regurgitant Orifice Area: 0.89 cm-sq  Aortic Annulus (systolic measurents):  Long-axis: 39 mm  Short-axis: 32 mm  Cross-sectional area: 9.62 cm-sq  Circumference: 123 mm  Sinuses of Valsalva (diastolic  measurements:  L-SOV - Width: 41 mm  Height: 24 mm  R-SOV - Width: 37 mm  Height: 27 mm  Eastman-SOV - Width: 42 mm  Height: 37 mm  AORTA AND PULMONARY MEASUREMENTS:  Ascending aorta ( < 40 mm): 37 mm  Descending aorta ( < 40 mm): 29 mm  Main pulmonary artery: ( < 30 mm): 25 mm  EXTRACARDIAC FINDINGS:  Visualized lung parenchyma demonstrates no suspicious appearing  pulmonary nodules or masses. No consolidative airspace disease.  No visualized pleural effusions. Visualized portions of the upper  abdomen are unremarkable. There are no aggressive appearing lytic  or blastic lesions noted in the visualized portions of the  skeleton.  IMPRESSION:  1. Type 1 bicuspid aortic valve with fusion of the left and right  cusp moieties. There is thickening and calcification of the valve  cusps, as above, and prolapse of the fused cusp resulting in a  larger regurgitant and orifice area (0.89 cm-sq), consistent with  severe aortic insufficiency.  2. Aortic root measurements, as above.  3. Left ventricular dilatation and both end diastole and end  systole, as above, without regional wall motion abnormalities.  4. Basal septal hypertrophy.  5. No evidence of significant coronary artery disease. The  patient's total coronary artery calcium score is zero.  6. Codominance of the coronary arteries.  Original Report Authenticated By: Florencia Reasons, M.D.  After full evaluation of the patient and his studies it was Dr. Orvan July opinion that he should undergo aortic valve replacement. He was admitted and on 04/16/2010 he underwent the following procedure:  Preoperative Diagnosis: Bicuspid Aortic Valve with Severe Aortic Insufficiency  Postoperative Diagnosis: Same  Procedure:  Aortic Valve Replacement  Edwards Magna Ease Pericardial Tissue Valve (size 27mm, model #3300TFX, serial [id number]) Surgeon: Salvatore Decent. Cornelius Moras, MD  Assistant: Al Corpus, CSFA  Anesthesia: Arta Bruce, MD  Operative Findings:    Bicuspid aortic valve with severe aortic insufficiency  Normal left ventricular systolic function     Postoperative Hospital Course: The patient has done quite well. He has maintained stable hemodynamics. He was weaned from the ventilator without difficulty. All routine lines, monitors, and drains have been discontinued in the standard fashion. Incisions are healing well without evidence of infection. Oxygen has been weaned and he maintains good saturations on room air. He tolerating gradually increasing activities using standard protocols. He did have some sinus tachycardia and beta blocker was titrated upwards to control. He did have an echocardiogram prior to discharge and this showed no evidence of pericardial effusion. He have an acute blood loss anemia which stabilized. Overall his  status is stable for discharge on 04/20/2011.  Basename 04/19/11 0610  NA 138  K 3.7  CL 105  CO2 27  GLUCOSE 114*  BUN 12  CALCIUM 8.4    Basename 04/19/11 0610  WBC 7.8  HGB 10.4*  HCT 29.0*  PLT 117*   No results found for this basename: INR:2 in the last 72 hours   Discharge Instructions:  The patient is discharged to home with extensive instructions on wound care and progressive ambulation.  They are instructed not to drive or perform any heavy lifting until returning to see the physician in his office.  Discharge Diagnosis:  AORTIC INSUF. Acute blood loss anemia Secondary Diagnosis: Patient Active Problem List  Diagnoses  . Hypertension  . Anal fissure  . Hx of vasectomy  . HTN (hypertension)  . Bicuspid aortic valve  . AI (aortic insufficiency)  . Aortic valve, bicuspid  . S/P aortic valve replacement   Past Medical History  Diagnosis Date  . HTN (hypertension)   . Aortic stenosis     Bicuspid Ao Valve (TEE 2003, TTE 2007),moderate aortic insufficiency  . S/P aortic valve replacement 04/16/2011       Paublo, Warshawsky  Home Medication Instructions [id number]   Printed  on:04/21/11 0848  Medication Information                    Omega-3 Fatty Acids (FISH OIL CONCENTRATE PO) Take 1 tablet by mouth daily.             Multiple Vitamins-Minerals (CENTRUM SILVER PO) Take 1 tablet by mouth daily.             Cholecalciferol (VITAMIN D3) 2000 UNITS TABS Take 1 tablet by mouth daily.             vitamin B-12 (CYANOCOBALAMIN) 1000 MCG tablet Take 1,000 mcg by mouth daily.             pyridoxine (B-6) 100 MG tablet Take 100 mg by mouth daily.           aspirin EC 325 MG EC tablet Take 1 tablet (325 mg total) by mouth daily.           oxyCODONE (OXY IR/ROXICODONE) 5 MG immediate release tablet Take 1-2 tablets (5-10 mg total) by mouth every 4 (four) hours as needed.           metoprolol tartrate (LOPRESSOR) 50 MG tablet Take 1 tablet (50 mg total) by mouth 2 (two) times daily.             Disposition: Discharged home  Patient's condition is Good  Gershon Crane, PA-C 04/21/2011  8:48 AM

## 2011-04-27 ENCOUNTER — Encounter: Payer: Self-pay | Admitting: Thoracic Surgery (Cardiothoracic Vascular Surgery)

## 2011-05-04 DIAGNOSIS — Z0279 Encounter for issue of other medical certificate: Secondary | ICD-10-CM

## 2011-05-06 ENCOUNTER — Ambulatory Visit (INDEPENDENT_AMBULATORY_CARE_PROVIDER_SITE_OTHER): Payer: 59 | Admitting: Physician Assistant

## 2011-05-06 ENCOUNTER — Encounter: Payer: Self-pay | Admitting: Physician Assistant

## 2011-05-06 VITALS — BP 138/102 | HR 85 | Ht 66.0 in | Wt 184.5 lb

## 2011-05-06 DIAGNOSIS — I359 Nonrheumatic aortic valve disorder, unspecified: Secondary | ICD-10-CM

## 2011-05-06 DIAGNOSIS — Z952 Presence of prosthetic heart valve: Secondary | ICD-10-CM

## 2011-05-06 DIAGNOSIS — Z954 Presence of other heart-valve replacement: Secondary | ICD-10-CM

## 2011-05-06 DIAGNOSIS — R51 Headache: Secondary | ICD-10-CM | POA: Insufficient documentation

## 2011-05-06 DIAGNOSIS — I2510 Atherosclerotic heart disease of native coronary artery without angina pectoris: Secondary | ICD-10-CM | POA: Insufficient documentation

## 2011-05-06 DIAGNOSIS — R6889 Other general symptoms and signs: Secondary | ICD-10-CM

## 2011-05-06 DIAGNOSIS — Z79899 Other long term (current) drug therapy: Secondary | ICD-10-CM

## 2011-05-06 DIAGNOSIS — I10 Essential (primary) hypertension: Secondary | ICD-10-CM

## 2011-05-06 DIAGNOSIS — R519 Headache, unspecified: Secondary | ICD-10-CM | POA: Insufficient documentation

## 2011-05-06 DIAGNOSIS — R5381 Other malaise: Secondary | ICD-10-CM

## 2011-05-06 DIAGNOSIS — R5383 Other fatigue: Secondary | ICD-10-CM

## 2011-05-06 NOTE — Patient Instructions (Signed)
Follow up in 3 months. Your physician recommends that you continue on your current medications as directed. Please refer to the Current Medication list given to you today. Your physician recommends that you go to the [facility] for lab work: BMET/TSH

## 2011-05-06 NOTE — Assessment & Plan Note (Signed)
Nonobstructive 1v CAD, by recent cath. Secondary prevention recommended, with aggressive management of HTN and low cholesterol. Regarding the latter, we'll request most recent FLP from primary M.D.'s office, and then make a decision regarding appropriate dose of statin to be added. Recommended aggressive management with target LDL 70 or less, if feasible.

## 2011-05-06 NOTE — Assessment & Plan Note (Signed)
Pt's wife attributes this to caffeine withdrawal, given he has DC'd all caffeinated beverages since surgery. I advised him to resume these drinks, albeit at a reduced dose of 1-2 drinks a day.

## 2011-05-06 NOTE — Assessment & Plan Note (Signed)
Patient reportedly has stable readings at home (120/80). He was on Tribenzor (trimodal combination medication) prior to surgery. He is currently on Lopressor, which was added for postop sinus tachycardia. We'll reassess at time of next OV, and add an additional agent, if needed. Patient otherwise has no indication for treatment with a beta blocker, and recommended weaning off this medication, in the near future.

## 2011-05-06 NOTE — Assessment & Plan Note (Signed)
Will request most recent labs from primary MD's office for review. Will check TSH level, if none in recent past.

## 2011-05-06 NOTE — Assessment & Plan Note (Signed)
Clinically stable, following recent surgery with bioprosthetic tissue valve. No exam finding of AI. Continue current medication regimen, and f/u with Dr Cornelius Moras, as scheduled.

## 2011-05-06 NOTE — Progress Notes (Signed)
HPI: Patient presents for post hospital followup, from [facility]. He underwent elective bioprosthetic surgical repair of a bicuspid aortic valve, by Dr. Cornelius Moras, secondary to severe aortic insufficiency. Patient was referred by Dr. Andee Lineman, following a TEE which yielded severe AI with an eccentric jet towards anterior and the leaflet. There was also mild dilatation of the aortic root, mild MR, and normal LVF (EF 50-55%)   Cardiac catheterization, performed by Dr. Antoine Poche on February 14, yielded 50-60% mid RCA disease with otherwise normal coronaries; EF 65%; mild aortic root dilatation with severe (4+) aortic insufficiency.  Dr. Cornelius Moras performed successful surgical repair, on March 8, employing an [facility] Ease Pericardial Tissue Valve (size 27 mm, model #3300 TFX, serial number [id number])  Clinically, he is slowly progressing; he has some mild residual chest soreness, but no anginal discomfort. Denies symptoms suggestive of CHF. Denies tachycardia palpitations. Complains of headache, which his wife attributes to the fact that he has not had any caffeinated beverages, since his hospitalization. She also points out that he feels "cold", and wondered if it might be related to the new metoprolol medication.   No Known Allergies  Current Outpatient Prescriptions  Medication Sig Dispense Refill  . aspirin EC 325 MG EC tablet Take 1 tablet (325 mg total) by mouth daily.  30 tablet    . Cholecalciferol (VITAMIN D3) 2000 UNITS TABS Take 1 tablet by mouth daily.        . metoprolol tartrate (LOPRESSOR) 50 MG tablet Take 1 tablet (50 mg total) by mouth 2 (two) times daily.  60 tablet  1  . Multiple Vitamins-Minerals (CENTRUM SILVER PO) Take 1 tablet by mouth daily.        . Omega-3 Fatty Acids (FISH OIL CONCENTRATE PO) Take 1 tablet by mouth daily.        Marland Kitchen pyridoxine (B-6) 100 MG tablet Take 100 mg by mouth daily.      . vitamin B-12 (CYANOCOBALAMIN) 1000 MCG tablet Take 1,000 mcg by mouth daily.        Marland Kitchen  DISCONTD: Olmesartan-Amlodipine-HCTZ (TRIBENZOR) 40-5-25 MG TABS Take 1 tablet by mouth daily.  30 tablet  6    Past Medical History  Diagnosis Date  . HTN (hypertension)   . Aortic stenosis     Bicuspid Ao Valve (TEE 2003, TTE 2007),moderate aortic insufficiency  . S/P aortic valve replacement 04/16/2011    Past Surgical History  Procedure Date  . Left arm surgery   . Vasectomy   . Anal fissure repair   . Aortic valve replacement 04/16/2011    Procedure: AORTIC VALVE REPLACEMENT (AVR);  Surgeon: Purcell Nails, MD;  Location: [facility] OR;  Service: Open Heart Surgery;  Laterality: N/A;  aortic valve replacement    History   Social History  . Marital Status: Married    Spouse Name: N/A    Number of Children: 2  . Years of Education: N/A   Occupational History  . Mechanic    Social History Main Topics  . Smoking status: Never Smoker   . Smokeless tobacco: Never Used  . Alcohol Use: Yes     Occasionally--socially  . Drug Use: No  . Sexually Active: Not on file   Other Topics Concern  . Not on file   Social History Narrative  . No narrative on file    Family History  Problem Relation Age of Onset  . Cancer Father 65    ROS: no nausea, vomiting; no fever, chills; no melena, hematochezia; no claudication  PHYSICAL EXAM: There were no vitals taken for this visit. GENERAL: 56 year old male, sitting upright; NAD HEENT: NCAT, PERRLA, EOMI; sclera clear; no xanthelasma NECK: palpable bilateral carotid pulses, no bruits; no JVD; no TM LUNGS: CTA bilaterally CARDIAC: RRR (S1, S2); no significant murmurs; no rubs or gallops; positive S4 ABDOMEN: soft, non-tender; intact BS EXTREMETIES: intact distal pulses; no significant peripheral edema SKIN: warm/dry; no obvious rash/lesions MUSCULOSKELETAL: no joint deformity; well-healed midline incision NEURO: no focal deficit; NL affect   EKG: reviewed and available in Electronic Records   ASSESSMENT & PLAN:

## 2011-05-11 ENCOUNTER — Encounter: Payer: Self-pay | Admitting: *Deleted

## 2011-05-11 ENCOUNTER — Other Ambulatory Visit: Payer: Self-pay | Admitting: Thoracic Surgery (Cardiothoracic Vascular Surgery)

## 2011-05-11 ENCOUNTER — Telehealth: Payer: Self-pay | Admitting: Cardiology

## 2011-05-11 DIAGNOSIS — I359 Nonrheumatic aortic valve disorder, unspecified: Secondary | ICD-10-CM

## 2011-05-11 NOTE — Telephone Encounter (Signed)
Received telephone call from [facility] at Dr. Lamont Dowdy dental office. Justin [name] needs to have a dental cleaning. Wanted to verify if he needs pre-medication or any restrictions for cleaning since he just had the open Heart Surgery. Please call Paulette [phone]

## 2011-05-12 NOTE — Telephone Encounter (Signed)
Yes, SBE prophylaxis is indicated, given that he has a prosthetic valve.

## 2011-05-12 NOTE — Telephone Encounter (Signed)
Patient recently had bioprosthetic valve replacement - does he need prophylactic antibiotic prior to cleaning?  No history of endocarditis that I could see based on last office note.  Dr. Andee Lineman is out of the office today.

## 2011-05-17 ENCOUNTER — Ambulatory Visit (INDEPENDENT_AMBULATORY_CARE_PROVIDER_SITE_OTHER): Payer: Self-pay | Admitting: Thoracic Surgery (Cardiothoracic Vascular Surgery)

## 2011-05-17 ENCOUNTER — Encounter: Payer: Self-pay | Admitting: Thoracic Surgery (Cardiothoracic Vascular Surgery)

## 2011-05-17 ENCOUNTER — Ambulatory Visit
Admission: RE | Admit: 2011-05-17 | Discharge: 2011-05-17 | Disposition: A | Discharge status: Home / Self Care | Payer: 59 | Source: Ambulatory Visit | Attending: Thoracic Surgery (Cardiothoracic Vascular Surgery) | Admitting: Thoracic Surgery (Cardiothoracic Vascular Surgery)

## 2011-05-17 VITALS — BP 128/90 | HR 72 | Temp 97.8°F | Resp 16 | Ht 66.0 in | Wt 184.0 lb

## 2011-05-17 DIAGNOSIS — Z952 Presence of prosthetic heart valve: Secondary | ICD-10-CM

## 2011-05-17 DIAGNOSIS — I359 Nonrheumatic aortic valve disorder, unspecified: Secondary | ICD-10-CM

## 2011-05-17 DIAGNOSIS — Z954 Presence of other heart-valve replacement: Secondary | ICD-10-CM

## 2011-05-17 NOTE — Telephone Encounter (Addendum)
Paulette with Dr. Lamont Dowdy office notified of below.  Will fax info on regimens for dental procedures.

## 2011-05-17 NOTE — Patient Instructions (Signed)
The patient has been reminded to continue to avoid any heavy lifting or strenuous use of arms or shoulders for at least a total of three months from the time of surgery. The patient has been instructed that they may return driving an automobile as long as they are no longer requiring oral narcotic pain relievers during the daytime.  They have been advised to start driving short distances during the daylight and gradually increase from there as they feel comfortable.  

## 2011-05-17 NOTE — Progress Notes (Signed)
[address]            [place] [address]          [phone]     CARDIOTHORACIC SURGERY OFFICE NOTE  Referring Provider is Lewayne Bunting, MD PCP is Rudi Heap, MD, MD   HPI:  Patient returns for routine followup status post aortic valve replacement using a bioprosthetic tissue valve on 04/16/2011. His postoperative recovery has been uncomplicated. Since hospital discharge she has continued to do very well. He reports that he has mild residual soreness in his chest. He has not been taking any pain relievers for quite some time now. His breathing well. His appetite is good. He is sleeping fairly well. He is walking every day and ask he walks fairly long distances without any difficulties. Overall he has no complaints.   Current Outpatient Prescriptions  Medication Sig Dispense Refill  . acetaminophen (TYLENOL) 500 MG tablet Take 1,000 mg by mouth every 6 (six) hours as needed.      Marland Kitchen aspirin EC 325 MG EC tablet Take 1 tablet (325 mg total) by mouth daily.  30 tablet    . Cholecalciferol (VITAMIN D3) 2000 UNITS TABS Take 1 tablet by mouth daily.        . metoprolol tartrate (LOPRESSOR) 50 MG tablet Take 1 tablet (50 mg total) by mouth 2 (two) times daily.  60 tablet  1  . Multiple Vitamins-Minerals (CENTRUM SILVER PO) Take 1 tablet by mouth daily.        . Omega-3 Fatty Acids (FISH OIL CONCENTRATE PO) Take 1 tablet by mouth daily.        Marland Kitchen pyridoxine (B-6) 100 MG tablet Take 100 mg by mouth daily.      . vitamin B-12 (CYANOCOBALAMIN) 1000 MCG tablet Take 1,000 mcg by mouth daily.        Marland Kitchen DISCONTD: Olmesartan-Amlodipine-HCTZ (TRIBENZOR) 40-5-25 MG TABS Take 1 tablet by mouth daily.  30 tablet  6      Physical Exam:   BP 128/90  Pulse 72  Temp 97.8 F (36.6 C)  Resp 16  Ht 5\' 6"  (1.676 m)  Wt 184 lb (83.462 kg)  BMI 29.70 kg/m2  SpO2 98%  General:  Well-appearing  Chest:   Clear to auscultation  CV:   Regular rate and rhythm without  murmur  Incisions:  Healing nicely  Abdomen:  Soft and nontender  Extremities:  Warm and well-perfused  Diagnostic Tests:  CHEST - 2 VIEW 05/17/2011 Comparison: 04/19/2011.  Findings: The heart size and mediastinal contours are stable status  post aortic valve replacement. The lungs are now clear. A small  left pleural effusion remains. There is no pneumothorax.  IMPRESSION:  Improved basilar aeration following recent open-heart surgery.  Small left pleural effusion.  Original Report Authenticated By: Gerrianne Scale, M.D.    Impression:  Patient is doing very well status post aortic valve replacement using a bioprosthetic tissue valve.  Plan:  I've encouraged patient to continue to increase his physical activity as tolerated with his only limitation remaining that he refrain from heavy lifting or strenuous use of his arms or shoulder for another 2 months. Because his employer will not allow him to return to work with physical restrictions he will probably need to stay out of work for another 2 months. I think he can return to driving an automobile and we discussed all  of his other physical limitations at length. We've not made any changes in his medications and we will leave any further alterations in long-term management for blood pressure to Dr. Andee Lineman and colleagues. We'll plan to see the patient back in 3 months just to make sure that he continues to do well.   Salvatore Decent. Cornelius Moras, MD 05/17/2011 10:19 AM

## 2011-06-01 ENCOUNTER — Telehealth: Payer: Self-pay | Admitting: Cardiology

## 2011-06-01 NOTE — Telephone Encounter (Signed)
Lucendia Herrlich took and telephone call today from Dornell Grasmick in regards to Mr. Justin [name] having an echo in July of 2013. Lucendia Herrlich said that the patient wanted to know if he still needs this echo. Can Dr. Andee Lineman please advise.

## 2011-06-03 NOTE — Telephone Encounter (Signed)
Marcelino Duster (wife) notified of below.

## 2011-06-03 NOTE — Telephone Encounter (Signed)
Patient had valve replacement and had already on 04/21/2011 a postoperative ECHO. So, no - he does not need an ECHO in July. He will need a yearly routine ECHO. Next ECHO for f/u will be in March 2014

## 2011-06-03 NOTE — Telephone Encounter (Signed)
Left message to return call 

## 2011-06-28 ENCOUNTER — Encounter: Payer: Self-pay | Admitting: Cardiology

## 2011-06-28 ENCOUNTER — Telehealth: Payer: Self-pay | Admitting: Cardiology

## 2011-06-28 DIAGNOSIS — R7881 Bacteremia: Secondary | ICD-10-CM | POA: Insufficient documentation

## 2011-06-28 DIAGNOSIS — N179 Acute kidney failure, unspecified: Secondary | ICD-10-CM | POA: Insufficient documentation

## 2011-06-28 DIAGNOSIS — R000 Tachycardia, unspecified: Secondary | ICD-10-CM | POA: Insufficient documentation

## 2011-06-28 DIAGNOSIS — I951 Orthostatic hypotension: Secondary | ICD-10-CM

## 2011-06-28 DIAGNOSIS — I351 Nonrheumatic aortic (valve) insufficiency: Secondary | ICD-10-CM

## 2011-06-28 DIAGNOSIS — I2510 Atherosclerotic heart disease of native coronary artery without angina pectoris: Secondary | ICD-10-CM

## 2011-06-28 NOTE — Assessment & Plan Note (Signed)
The patient has an excessive tachycardia which is sinus in nature.  It appears that he may be dehydrated.  Several days ago and presents to the emergency room with nausea and vomiting As well as a fever although this was not documented in the emergency room.  The patient prior to surgery had some cold intolerance and we will also check a TSH level.  His heart rate in the emergency room was also quite elevated at 116 beats per minutes and the patient had metabolic acidosis with an anion gap of 18.I suspect the patient is dehydrated but more importantly is excessively vasodilated due to the new prescription with Tribenzor, which consists of olmesartan, amlodipine and hydrochlorothiazide.  In particular the patient's physiology has changed since his aortic insufficiency has been addressed and does not have chronic AI anymore which high systolic blood pressure and wide pulse pressure.  Nevertheless I still am worried about endocarditis in postoperative infection.  The patient did have blood cultures done 5 days ago in the emergency room and they have been negative.  His white count however was elevated and that I wrote a new order for blood work to be done on Wednesday including a white count, blood count and TSH.

## 2011-06-28 NOTE — Assessment & Plan Note (Signed)
The patient has severe orthostatic hypotension his blood pressure decreased from 100 mmHg systolic to approximately 80 mmHg systolic which I only could palpate and not be able to auscultate with the stethoscope.The patient's heart rate also increased at rest lying down from 105 beats per minutes 133bpm when standing for 2 min.This was done with the alive core single lead tracing and the patient remained in sinus tachycardia while standing.  I told the patient is to stop drinking caffeinated beverages , stop his excessive use of [employer] drinking Gatorade as much as he can tolerate.  I also told him to stop his beta blocker and start ARB in conjunction with the amlodipine and hydrochlorothiazide.  I gave his wife my e-mail and the daughter states that she is able to take his blood pressure as well as his heart rates in the lying and standing position and the results will be forwarded.

## 2011-06-28 NOTE — Assessment & Plan Note (Signed)
The patient was in acute renal failure in the emergency room.  His creatinine was normal preoperatively.  He now has a creatinine of 2.76 and this was several days ago there was an associated metabolic acidosis with elevated anion gap at 18 total protein of 9.1 by mouth and was mildly elevated at 28.  Electrolytes were otherwise normal calcium was elevated at 10.5.  Reported the patient was given a liter of fluid in the emergency room as well as some Zofran but still was quite tachycardic.  Interestingly no chest x-ray was obtained.  No followup labs were scheduled.

## 2011-06-28 NOTE — Telephone Encounter (Signed)
Going to do house call this evening: This patient denies having very worried. He had fever after a recent valve replacement also he has acute renal failure which they missed in the emergency room and didn't comment on his wife was worried about pneumonia and no chest x-ray was obtained.

## 2011-06-28 NOTE — Progress Notes (Signed)
Peyton Bottoms, MD, [facility] Board Certified in Adult Cardiovascular Medicine,Internal Medicine and Critical Care Medicine    CC: Housecall-patient unwell with nausea status post recent open heart surgery, unable to leave his house with severe orthostatic symptoms.  HPI:  The patient is a 56 year old male who recently underwent aortic valve replacement for bicuspid aortic valve with prolapse.  The patient had severe aortic insufficiency.  His initial postoperative course was uneventful.  However last week the patient had to go to the emergency room because of subjective fever and nausea and vomiting the lateral bile -looking fluid.  Reportedly saw on Wednesday his cardiac surgeon who restarted his blood pressure medication including Tribenzor and beta blocker therapy.  In the next 24 hours the patient states that he started feeling poorly.  He had significant orthostatic symptoms.  He felt flushed and sleeping and started having nausea and vomiting.  He did report also cough for approximately one week.  He went to the emergency room last week where it was noted that he had an elevated white count and blood cultures were performed which so far have been negative.  He was noted to be in acute renal failure with a creatinine of 2.76 and an anion gap of 18.  The patient was given a liter of fluid and Zofran and was sent back home despite persistent tachycardia. The patient's wife had called the office because she was very concerned that he was not getting any better.  She states that particularly over the weekend he was pale appearing.  Today he decided to cut the grass and was completely exhausted and felt extremely dizzy and near syncopal. Of note is also that the patient is scheduled for mouth surgery including dental implants.  He does not report any dental abscesses however.Of note is that despite his cough and the wife's concern for pneumonia No chest x-ray was performed.  PMH: reviewed and listed  in Problem List in Electronic Records (and see below) Past Medical History  Diagnosis Date  . HTN (hypertension)   . Aortic stenosis     Bicuspid Ao Valve (TEE 2003, TTE 2007),moderate aortic insufficiency  . S/P aortic valve replacement 04/16/2011    Secondary to severe aortic insufficiency with cusp prolapse.   Past Surgical History  Procedure Date  . Left arm surgery   . Vasectomy   . Anal fissure repair   . Aortic valve replacement 04/16/2011    Procedure: AORTIC VALVE REPLACEMENT (AVR);  Surgeon: Purcell Nails, MD;  Location: [facility] OR;  Service: Open Heart Surgery;  Laterality: N/A;  aortic valve replacement    Allergies/SH/FHX : available in Electronic Records for review  No Known Allergies History   Social History  . Marital Status: Married    Spouse Name: N/A    Number of Children: 2  . Years of Education: N/A   Occupational History  . Mechanic    Social History Main Topics  . Smoking status: Never Smoker   . Smokeless tobacco: Never Used  . Alcohol Use: Yes     Occasionally--socially  . Drug Use: No  . Sexually Active: Not on file   Other Topics Concern  . Not on file   Social History Narrative  . No narrative on file   Family History  Problem Relation Age of Onset  . Cancer Father 54    Medications: Current Outpatient Prescriptions  Medication Sig Dispense Refill  . acetaminophen (TYLENOL) 500 MG tablet Take 1,000 mg by mouth  every 6 (six) hours as needed.      Marland Kitchen aspirin EC 325 MG EC tablet Take 1 tablet (325 mg total) by mouth daily.  30 tablet    . Cholecalciferol (VITAMIN D3) 2000 UNITS TABS Take 1 tablet by mouth daily.        . metoprolol tartrate (LOPRESSOR) 50 MG tablet Take 1 tablet (50 mg total) by mouth 2 (two) times daily.  60 tablet  1  . Multiple Vitamins-Minerals (CENTRUM SILVER PO) Take 1 tablet by mouth daily.        . Omega-3 Fatty Acids (FISH OIL CONCENTRATE PO) Take 1 tablet by mouth daily.        Marland Kitchen pyridoxine (B-6) 100 MG tablet Take  100 mg by mouth daily.      . vitamin B-12 (CYANOCOBALAMIN) 1000 MCG tablet Take 1,000 mcg by mouth daily.        Marland Kitchen DISCONTD: Olmesartan-Amlodipine-HCTZ (TRIBENZOR) 40-5-25 MG TABS Take 1 tablet by mouth daily.  30 tablet  6    ROS: No nausea or vomiting. No fever or chills.No melena or hematochezia.No bleeding.No claudication  Physical Exam: Blood pressure 100/50 with a heart rate of 105 bpm.  Upon standing heart rate increased to 133 bpm and blood pressure dropped to 80 mm only palpable and not audible by stethoscope.  Saturation was 97%.  Respirations were 18 breaths per minute and unlabored General:Somewhat pale-appearing white male feeling poorly and extremely dizzy Neck:Normal carotid upstroke and no carotid bruits.  No thyromegaly and no nodular thyroid Lungs:Clear breath sounds bilaterally without wheezing or crackles Cardiac:Regular rate and rhythm tachycardic resting heart rate 110 beats per minutes, no S3 or S4 no pathological murmurs Vascular:No edema.  Normal distal pulses Skin:Warm and dry Physcologic:  12lead ECG:A single lead tracings were obtained in the lying to sitting and standing position and were all sinus tachycardia. Limited bedside ECHO:No pericardial effusion was demonstrated.  The aortic valve appeared to be well seated.  No definite large vegetations although this certainly was not a diagnostic study mitral valve appeared normal LV function was also within normal limits with an ejection fraction of 55-60% and no wall motion abnormalities. No images are attached to the encounter.   Assessment and Plan  Tachycardia The patient has an excessive tachycardia which is sinus in nature.  It appears that he may be dehydrated.  Several days ago and presents to the emergency room with nausea and vomiting As well as a fever although this was not documented in the emergency room.  The patient prior to surgery had some cold intolerance and we will also check a TSH level.  His  heart rate in the emergency room was also quite elevated at 116 beats per minutes and the patient had metabolic acidosis with an anion gap of 18.I suspect the patient is dehydrated but more importantly is excessively vasodilated due to the new prescription with Tribenzor, which consists of olmesartan, amlodipine and hydrochlorothiazide.  In particular the patient's physiology has changed since his aortic insufficiency has been addressed and does not have chronic AI anymore which high systolic blood pressure and wide pulse pressure.  Nevertheless I still am worried about endocarditis in postoperative infection.  The patient did have blood cultures done 5 days ago in the emergency room and they have been negative.  His white count however was elevated and that I wrote a new order for blood work to be done on Wednesday including a white count, blood count and TSH.  Acute  renal failure The patient was in acute renal failure in the emergency room.  His creatinine was normal preoperatively.  He now has a creatinine of 2.76 and this was several days ago there was an associated metabolic acidosis with elevated anion gap at 18 total protein of 9.1 by mouth and was mildly elevated at 28.  Electrolytes were otherwise normal calcium was elevated at 10.5.  Reported the patient was given a liter of fluid in the emergency room as well as some Zofran but still was quite tachycardic.  Interestingly no chest x-ray was obtained.  No followup labs were scheduled.  Orthostatic hypotension The patient has severe orthostatic hypotension his blood pressure decreased from 100 mmHg systolic to approximately 80 mmHg systolic which I only could palpate and not be able to auscultate with the stethoscope.The patient's heart rate also increased at rest lying down from 105 beats per minutes 133bpm when standing for 2 min.This was done with the alive core single lead tracing and the patient remained in sinus tachycardia while standing.  I  told the patient is to stop drinking caffeinated beverages , stop his excessive use of [employer] drinking Gatorade as much as he can tolerate.  I also told him to stop his beta blocker and start ARB in conjunction with the amlodipine and hydrochlorothiazide.  I gave his wife my e-mail and the daughter states that she is able to take his blood pressure as well as his heart rates in the lying and standing position and the results will be forwarded.  CAD (coronary artery disease) No chest pain.  Mild sternotomy pain in the sternum is stable.  Disposition: Blood work will be done Wednesday will review this and based on this the side when I need to see the patient back or whether hospitalization is required depending on his symptoms.  His wife told me that she will stay in contact with me via e-mail and his daughter will take frequent orthostatic blood pressures.  I also told the patient that if his blood cultures turned positive he will need to be admitted for intravenous antibiotics.  Patient Active Problem List  Diagnoses  . Hypertension  . Anal fissure  . Hx of vasectomy  . Bicuspid aortic valve  . AI (aortic insufficiency)  . Aortic valve, bicuspid  . S/P aortic valve replacement  . Headache  . CAD (coronary artery disease)  . Orthostatic hypotension  . Tachycardia  . Bacteremia  . Acute renal failure

## 2011-06-28 NOTE — Assessment & Plan Note (Signed)
No chest pain.  Mild sternotomy pain in the sternum is stable.

## 2011-06-28 NOTE — Telephone Encounter (Signed)
Justin [name] (wife) called stating that he has been sick and was seen in the Emergency room at [facility] On 06/24/2011 for N/V and fever. Mrs. Sam thinks the blood pressure medicine is making him sick. She is requesting to have someone see [name].  Please call cell # [phone]

## 2011-06-29 NOTE — Telephone Encounter (Signed)
See house call dictation.

## 2011-06-29 NOTE — Progress Notes (Signed)
Patient ID: Justin [name], male   DOB: 08/14/1955, 56 y.o.   MRN: [id number] PATIENT SCHEDULE 7/24-SRS

## 2011-06-30 ENCOUNTER — Other Ambulatory Visit: Payer: Self-pay | Admitting: Cardiology

## 2011-07-06 ENCOUNTER — Telehealth: Payer: Self-pay | Admitting: *Deleted

## 2011-07-06 MED ORDER — AMLODIPINE BESYLATE 5 MG PO TABS: 5.0000 mg | ORAL_TABLET | Freq: Every day | ORAL | Status: DC

## 2011-07-06 NOTE — Telephone Encounter (Signed)
>   On Jul 01, 2011, at 7:31 PM, Bracy Pepper [email or web address]> wrote: > >> Hey today Kenny's blood pressure has been 130/110 morning 127/110 afternoon 134/98     And also how long should he continue to drink G2 ? And is it okay for him to continue to mowing ? Thanks !                    Michelle Haider >> Sent from my iPhone  On Jul 02, 2011, at 7:41 AM, Jasmine December [email or web address]> wrote:  > Are you sure the 110 on the lower number is correct ? If so I would in next few days start him on Amlodipine 5 mg daily as only med . Give me a few more readings and I will have Inocencio Homes call in a 30 day script to try its a genetic drug . That's probably all he needs and yes he can cut grass , but especially now be needs to stay hydrated with G2 - Thea and sodas don't help . > > Guy > > Sent from my iPhone   On Fri, Jul 02, 2011 at 1:56 PM, Gershon [email or web address]> wrote: At 10:00 131/96      1:00 139/103 that was right after lunch  Sent from my iPhone   On Jul 02, 2011, at [place] PM, Peyton Bottoms [email or web address]> wrote: Will call in Amlodipine 5 mg Po qdaily  Where do you want me to call into.  Peyton Bottoms  On Fri, Jul 02, 2011 at 6:19 PM, Gershon Cull [email or web address]> wrote: [facility] thanks   Sent from my iPhone  On Jul 02, 2011, at 6:23 PM, Peyton Bottoms [email or web address]> wrote: Which [facility] ? phone number? whats his date of birth also? Thanks Madelaine Bhat could you ca in amlodipine 5g PO qdaily with couple of refills see email below ASAP. Also let wife know ,  Thanks Lorre Munroe from my [employer] and informed patient of the above information and that prescription sent to [facility] [place].

## 2011-07-07 ENCOUNTER — Encounter: Payer: Self-pay | Admitting: *Deleted

## 2011-08-16 ENCOUNTER — Ambulatory Visit: Payer: 59 | Admitting: Thoracic Surgery (Cardiothoracic Vascular Surgery)

## 2011-08-23 ENCOUNTER — Ambulatory Visit (INDEPENDENT_AMBULATORY_CARE_PROVIDER_SITE_OTHER): Payer: 59 | Admitting: Thoracic Surgery (Cardiothoracic Vascular Surgery)

## 2011-08-23 ENCOUNTER — Encounter: Payer: Self-pay | Admitting: Thoracic Surgery (Cardiothoracic Vascular Surgery)

## 2011-08-23 ENCOUNTER — Ambulatory Visit: Payer: 59 | Admitting: Thoracic Surgery (Cardiothoracic Vascular Surgery)

## 2011-08-23 VITALS — BP 133/97 | HR 100 | Resp 16 | Ht 66.0 in | Wt 190.0 lb

## 2011-08-23 DIAGNOSIS — Z954 Presence of other heart-valve replacement: Secondary | ICD-10-CM

## 2011-08-23 DIAGNOSIS — Z8774 Personal history of (corrected) congenital malformations of heart and circulatory system: Secondary | ICD-10-CM

## 2011-08-23 DIAGNOSIS — Z952 Presence of prosthetic heart valve: Secondary | ICD-10-CM

## 2011-08-23 DIAGNOSIS — Z09 Encounter for follow-up examination after completed treatment for conditions other than malignant neoplasm: Secondary | ICD-10-CM

## 2011-08-23 DIAGNOSIS — Z8679 Personal history of other diseases of the circulatory system: Secondary | ICD-10-CM

## 2011-08-23 NOTE — Progress Notes (Signed)
                   [address]            [place] [address]          [phone]     CARDIOTHORACIC SURGERY OFFICE NOTE  Referring Provider is de Melanie Crazier, MD PCP is Rudi Heap, MD   HPI:  Patient returns for followup status post aortic valve replacement on 04/16/2011. His postoperative recovery has been uneventful and he was last seen here in the office on 05/17/2011. Towards the end of May the patient had an episode of severe dehydration associated with acute renal failure for which she was apparently seen in the emergency department at [facility] in [place]. Since then he has been followed carefully by Dr. Andrey Cota to has continued to adjust his medications. The patient has done quite well. He reports no problems and he is back at work full-time with good exercise tolerance. He states that his breathing and excise tolerance are probably similar to have a were prior to surgery. Overall he has no complaints.   Current Outpatient Prescriptions  Medication Sig Dispense Refill  . acetaminophen (TYLENOL) 500 MG tablet Take 1,000 mg by mouth every 6 (six) hours as needed.      Marland Kitchen amLODipine (NORVASC) 5 MG tablet Take 1 tablet (5 mg total) by mouth daily.  30 tablet  2  . aspirin EC 325 MG EC tablet Take 1 tablet (325 mg total) by mouth daily.  30 tablet    . Cholecalciferol (VITAMIN D3) 2000 UNITS TABS Take 1 tablet by mouth daily.        . Multiple Vitamins-Minerals (CENTRUM SILVER PO) Take 1 tablet by mouth daily.        . Omega-3 Fatty Acids (FISH OIL CONCENTRATE PO) Take 1 tablet by mouth daily.        Marland Kitchen pyridoxine (B-6) 100 MG tablet Take 100 mg by mouth daily.      . vitamin B-12 (CYANOCOBALAMIN) 1000 MCG tablet Take 1,000 mcg by mouth daily.        Marland Kitchen DISCONTD: Olmesartan-Amlodipine-HCTZ (TRIBENZOR) 40-5-25 MG TABS Take 1 tablet by mouth daily.  30 tablet  6      Physical Exam:   BP 133/97  Pulse 100  Resp 16  Ht 5\' 6"  (1.676 m)  Wt 190 lb (86.183 kg)   BMI 30.67 kg/m2  SpO2 94%  General:  Well-appearing  Chest:   Clear to auscultation  CV:   Regular rate and rhythm without murmur  Incisions:  Sternotomy scar has healed nicely and the sternum is stable  Abdomen:  Soft and nontender  Extremities:  Warm and well-perfused  Diagnostic Tests:  n/a   Impression:  The patient is doing well more than 4 months status post aortic valve replacement using a bioprosthetic tissue valve  Plan:  The patient will continue to increase his physical activity as tolerated without any particular limitations. In the future he will call and return to see [place] as needed. All of his questions been addressed.   Salvatore Decent. Cornelius Moras, MD 08/23/2011 3:54 PM

## 2011-09-01 ENCOUNTER — Ambulatory Visit: Payer: 59 | Admitting: Cardiology

## 2011-09-02 ENCOUNTER — Ambulatory Visit (INDEPENDENT_AMBULATORY_CARE_PROVIDER_SITE_OTHER): Payer: 59 | Admitting: Adult Health

## 2011-09-02 ENCOUNTER — Encounter: Payer: Self-pay | Admitting: Adult Health

## 2011-09-02 VITALS — BP 146/100 | HR 84 | Ht 66.0 in | Wt 189.8 lb

## 2011-09-02 DIAGNOSIS — Z952 Presence of prosthetic heart valve: Secondary | ICD-10-CM

## 2011-09-02 DIAGNOSIS — Z954 Presence of other heart-valve replacement: Secondary | ICD-10-CM

## 2011-09-02 DIAGNOSIS — I2510 Atherosclerotic heart disease of native coronary artery without angina pectoris: Secondary | ICD-10-CM

## 2011-09-02 DIAGNOSIS — I10 Essential (primary) hypertension: Secondary | ICD-10-CM

## 2011-09-02 MED ORDER — AMLODIPINE BESYLATE 5 MG PO TABS: 5.0000 mg | ORAL_TABLET | Freq: Every day | ORAL | Status: DC

## 2011-09-02 MED ORDER — HYDROCHLOROTHIAZIDE 12.5 MG PO CAPS: 12.5000 mg | ORAL_CAPSULE | Freq: Every day | ORAL | Status: DC

## 2011-09-02 MED ORDER — POTASSIUM CHLORIDE ER 10 MEQ PO TBCR: 10.0000 meq | EXTENDED_RELEASE_TABLET | Freq: Every day | ORAL | Status: DC

## 2011-09-02 NOTE — Patient Instructions (Addendum)
Start HCTZ 12.5mg  daily.  Potassium daily.  Keep a daily record of blood pressures and take with you to Dr. Margarita Mail appt.  Your physician recommends that you schedule a follow-up appointment in 1 month with Dr. Andee Lineman.  Have a BMET drawn at the lab in [place] in 1 week.

## 2011-09-02 NOTE — Assessment & Plan Note (Signed)
He is doing well. He has no complaints of dyspnea on exertion or chest pain. We will continue to followup with Dr. Andee Lineman for ongoing assessment.

## 2011-09-02 NOTE — Progress Notes (Signed)
HPI: Justin [name] is a 56 year old patient of Dr.DeGent we are following on 6 month evaluation for ongoing assessment and treatment of bioprosthetic surgical repair/replacement of the aortic valve employing a [facility] Ease pericardial tissue valve per Dr. Charlann Boxer in March of 2013. He also has a history of nonobstructive CAD, hypertension, and cold intolerance. He comes today without complaint. 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 He rarely takes a day off. He denies any chest pain or shortness of breath.  No Known Allergies  Current Outpatient Prescriptions  Medication Sig Dispense Refill  . acetaminophen (TYLENOL) 500 MG tablet Take 1,000 mg by mouth every 6 (six) hours as needed.      Marland Kitchen amLODipine (NORVASC) 5 MG tablet Take 1 tablet (5 mg total) by mouth daily.  90 tablet  3  . aspirin EC 325 MG EC tablet Take 1 tablet (325 mg total) by mouth daily.  30 tablet    . Cholecalciferol (VITAMIN D3) 2000 UNITS TABS Take 1 tablet by mouth daily.        . Multiple Vitamins-Minerals (CENTRUM SILVER PO) Take 1 tablet by mouth daily.        . Omega-3 Fatty Acids (FISH OIL CONCENTRATE PO) Take 1 tablet by mouth daily.        Marland Kitchen pyridoxine (B-6) 100 MG tablet Take 100 mg by mouth daily.      . vitamin B-12 (CYANOCOBALAMIN) 1000 MCG tablet Take 1,000 mcg by mouth daily.        Marland Kitchen DISCONTD: amLODipine (NORVASC) 5 MG tablet Take 1 tablet (5 mg total) by mouth daily.  30 tablet  2  . hydrochlorothiazide (MICROZIDE) 12.5 MG capsule Take 1 capsule (12.5 mg total) by mouth daily.  90 capsule  3  . potassium chloride (K-DUR) 10 MEQ tablet Take 1 tablet (10 mEq total) by mouth daily.  90 tablet  3  . DISCONTD: Olmesartan-Amlodipine-HCTZ (TRIBENZOR) 40-5-25 MG TABS Take 1 tablet by mouth daily.  30 tablet  6    Past Medical History  Diagnosis Date  . HTN (hypertension)   . Aortic stenosis     Bicuspid Ao Valve (TEE 2003, TTE 2007),moderate aortic  insufficiency  . S/P aortic valve replacement 04/16/2011    Secondary to severe aortic insufficiency with cusp prolapse.    Past Surgical History  Procedure Date  . Left arm surgery   . Vasectomy   . Anal fissure repair   . Aortic valve replacement 04/16/2011    Procedure: AORTIC VALVE REPLACEMENT (AVR);  Surgeon: Purcell Nails, MD;  Location: [facility] OR;  Service: Open Heart Surgery;  Laterality: N/A;  aortic valve replacement    WUJ:WJXBJY of systems complete and found to be negative unless listed above PHYSICAL EXAM BP 146/100  Pulse 84  Ht 5\' 6"  (1.676 m)  Wt 189 lb 12 oz (86.07 kg)  BMI 30.63 kg/m2  General: Well developed, well nourished, in no acute distress Head: Eyes PERRLA, No xanthomas.   Normal cephalic and atramatic  Lungs: Clear bilaterally to auscultation and percussion. Heart: HRRR S1 S2, soft S4  without MRG.  Pulses are 2+ & equal.            No carotid bruit. No JVD.  No abdominal bruits. No femoral bruits. Abdomen: Bowel sounds are positive, abdomen soft and non-tender without masses or  Hernia's noted. Msk:  Back normal, normal gait. Normal strength and tone for age. Extremities: No clubbing, cyanosis or edema.  DP +1 Neuro: Alert and oriented X 3. Psych:  Good affect, responds appropriately  EKG: NSR with left axis deviation and evidence of LVH.:  ASSESSMENT AND PLAN

## 2011-09-02 NOTE — Assessment & Plan Note (Signed)
Blood pressure is not adequately controlled on this visit. It was mildly elevated on last visit with Mr. Justin [name] at 138/102. Review of echocardiogram demonstrates an EF of 65% with no mention of diastolic dysfunction. We will add HCTZ 12.5 mg to medication regimen. Review of recent labs demonstrates a potassium of 3.6. Will also add Micro-K 10 mEq daily. He complains of leg cramps often. He will continue on the Norvasc. According to patient he has a history of mild renal insufficiency during hospitalization for aortic valve repair. We will repeat BMT in one week. He is to followup with Dr. Andee [name] in one month for continued evaluation and management of this. I have asked him to take his blood pressure at the same time every day and record.   He admits to snoring with nocturnal leg cramping. He has never been diagnosed with sleep apnea. LVH is probably related to aortic valve disease in the past. However with hypertension it may be reasonable to evaluate for sleep apnea with a sleep study should his blood pressure not be adequately controlled. We will defer Dr.  Earnestine [name] to order at his discretion.

## 2011-09-02 NOTE — Assessment & Plan Note (Signed)
Cardiac catheterization completed on 03/25/2011 demonstrated a 50-60% mid RCA disease with otherwise normal coronaries. We will defer any further testing unless he becomes symptomatic. There are Q waves noted inferiorly.

## 2011-09-28 ENCOUNTER — Encounter: Payer: Self-pay | Admitting: Adult Health

## 2011-10-04 ENCOUNTER — Encounter: Payer: Self-pay | Admitting: Adult Health

## 2011-10-04 ENCOUNTER — Ambulatory Visit (INDEPENDENT_AMBULATORY_CARE_PROVIDER_SITE_OTHER): Payer: 59 | Admitting: Adult Health

## 2011-10-04 VITALS — BP 130/98 | HR 72 | Ht 66.0 in | Wt 193.0 lb

## 2011-10-04 DIAGNOSIS — Z952 Presence of prosthetic heart valve: Secondary | ICD-10-CM

## 2011-10-04 DIAGNOSIS — Z954 Presence of other heart-valve replacement: Secondary | ICD-10-CM

## 2011-10-04 DIAGNOSIS — I10 Essential (primary) hypertension: Secondary | ICD-10-CM

## 2011-10-04 MED ORDER — POTASSIUM CHLORIDE ER 10 MEQ PO TBCR: 20.0000 meq | EXTENDED_RELEASE_TABLET | Freq: Every day | ORAL | Status: DC

## 2011-10-04 MED ORDER — HYDROCHLOROTHIAZIDE 25 MG PO TABS: 25.0000 mg | ORAL_TABLET | Freq: Every day | ORAL | Status: DC

## 2011-10-04 NOTE — Patient Instructions (Addendum)
Your physician wants you to follow-up in: 6 months with Joni Reining, NP.  You will receive a reminder letter in the mail two months in advance. If you don't receive a letter, please call our office to schedule the follow-up appointment.  Your physician recommends that you schedule a follow-up appointment in: 1 week with a nurse visit for a blood pressure check.  Your physician recommends that you return for lab work in: 1 week to [employer].  Increase potassium to 52meq(2 pills) daily.  Increase HCTZ to 25mg  daily.

## 2011-10-04 NOTE — Assessment & Plan Note (Signed)
Blood pressure is still not well-controlled concerning his diastolic pressures. Will increase HCTZ to 25 mg daily and increase potassium to 20 mEq daily. He is to avoid salty foods and a low-salt diet is provided to him. He will continue to record his blood pressures, and see [place] in one week for blood pressure check and a BMET. We will see him again in 6 months unless he becomes symptomatic.

## 2011-10-04 NOTE — Assessment & Plan Note (Signed)
He is asymptomatic at this time. Clinical evaluation does not reveal rubs, gallops, or abnormal heart sounds. Continue current medications with the exception of blood pressure control medication changes.

## 2011-10-04 NOTE — Progress Notes (Signed)
HPI: Justin [name] is a pleasant 56 year old male patient of Dr. Andee Lineman we are following for ongoing assessment of treatment of hypertension, status post bioprosthetic surgical repair/replacement of the aortic valve using an [facility] Ease pericardial tissue valve per Dr. Barry Dienes in March of 2013. On last visit the patient was complaining of some mild leg cramps, and was noted to have some diastolic blood pressure elevations. He was started on HCTZ 12.5 mg daily and Micro-K 10 mEq daily. He is here in followup after one month for further evaluation bringing a copy of his blood pressure readings with him. Leg cramping is almost a eliminated. He unfortunately continues to eat out a lot as he works night shift, which includes a lot of salty foods.  No Known Allergies  Current Outpatient Prescriptions  Medication Sig Dispense Refill  . acetaminophen (TYLENOL) 500 MG tablet Take 1,000 mg by mouth every 6 (six) hours as needed.      Marland Kitchen amLODipine (NORVASC) 5 MG tablet Take 1 tablet (5 mg total) by mouth daily.  90 tablet  3  . aspirin EC 325 MG EC tablet Take 1 tablet (325 mg total) by mouth daily.  30 tablet    . Cholecalciferol (VITAMIN D3) 2000 UNITS TABS Take 1 tablet by mouth daily.        . Multiple Vitamins-Minerals (CENTRUM SILVER PO) Take 1 tablet by mouth daily.        . Omega-3 Fatty Acids (FISH OIL CONCENTRATE PO) Take 1 tablet by mouth daily.        . potassium chloride (K-DUR) 10 MEQ tablet Take 2 tablets (20 mEq total) by mouth daily.  180 tablet  3  . pyridoxine (B-6) 100 MG tablet Take 100 mg by mouth daily.      . vitamin B-12 (CYANOCOBALAMIN) 1000 MCG tablet Take 1,000 mcg by mouth daily.        Marland Kitchen DISCONTD: potassium chloride (K-DUR) 10 MEQ tablet Take 1 tablet (10 mEq total) by mouth daily.  90 tablet  3  . hydrochlorothiazide (HYDRODIURIL) 25 MG tablet Take 1 tablet (25 mg total) by mouth daily.  90 tablet  3  . DISCONTD: Olmesartan-Amlodipine-HCTZ (TRIBENZOR) 40-5-25 MG TABS Take 1  tablet by mouth daily.  30 tablet  6    Past Medical History  Diagnosis Date  . HTN (hypertension)   . Aortic stenosis     Bicuspid Ao Valve (TEE 2003, TTE 2007),moderate aortic insufficiency  . S/P aortic valve replacement 04/16/2011    Secondary to severe aortic insufficiency with cusp prolapse.    Past Surgical History  Procedure Date  . Left arm surgery   . Vasectomy   . Anal fissure repair   . Aortic valve replacement 04/16/2011    Procedure: AORTIC VALVE REPLACEMENT (AVR);  Surgeon: Purcell Nails, MD;  Location: [facility] OR;  Service: Open Heart Surgery;  Laterality: N/A;  aortic valve replacement    WUJ:WJXBJY of systems complete and found to be negative unless listed above  PHYSICAL EXAM BP 130/98  Pulse 72  Ht 5\' 6"  (1.676 m)  Wt 193 lb (87.544 kg)  BMI 31.15 kg/m2  General: Well developed, well nourished, in no acute distress Head: Eyes PERRLA, No xanthomas.   Normal cephalic and atramatic  Lungs: Clear bilaterally to auscultation and percussion. Heart: HRRR S1 S2, without MRG.  Pulses are 2+ & equal.            No carotid bruit. No JVD.  No abdominal bruits.  No femoral bruits. Abdomen: Bowel sounds are positive, abdomen soft and non-tender without masses or                  Hernia's noted. Msk:  Back normal, normal gait. Normal strength and tone for age. Sternotomy scar is healing well, with some keloid thickening noted. Extremities: No clubbing, cyanosis or edema.  DP +1 Neuro: Alert and oriented X 3. Psych:  Good affect, responds appropriately    ASSESSMENT AND PLAN

## 2011-10-12 ENCOUNTER — Ambulatory Visit (INDEPENDENT_AMBULATORY_CARE_PROVIDER_SITE_OTHER): Payer: 59 | Admitting: *Deleted

## 2011-10-12 VITALS — BP 141/107 | HR 87 | Wt 193.0 lb

## 2011-10-12 DIAGNOSIS — I10 Essential (primary) hypertension: Secondary | ICD-10-CM

## 2011-10-12 LAB — BASIC METABOLIC PANEL
BUN: 17 mg/dL (ref 6–23)
CO2: 27 mEq/L (ref 19–32)
Chloride: 103 mEq/L (ref 96–112)
Glucose, Bld: 82 mg/dL (ref 70–99)
Potassium: 4 mEq/L (ref 3.5–5.3)
Sodium: 138 mEq/L (ref 135–145)

## 2011-10-12 NOTE — Progress Notes (Signed)
Presents for blood pressure check per request of Lorin Picket, NP.  States bp at home has been running consistently high.  States taking medications, as prescribed. Last dose of amlodipine was at 11PM last evening.

## 2012-04-01 ENCOUNTER — Other Ambulatory Visit: Payer: Self-pay | Admitting: Cardiology

## 2012-06-25 IMAGING — CR DG CHEST 1V PORT
1 series · 1 of 1 positions shown · non-contrast
Comparison: Two-view chest 04/14/2011.

CLINICAL DATA: Status post CABG.

PORTABLE CHEST - 1 VIEW

[view not recorded]
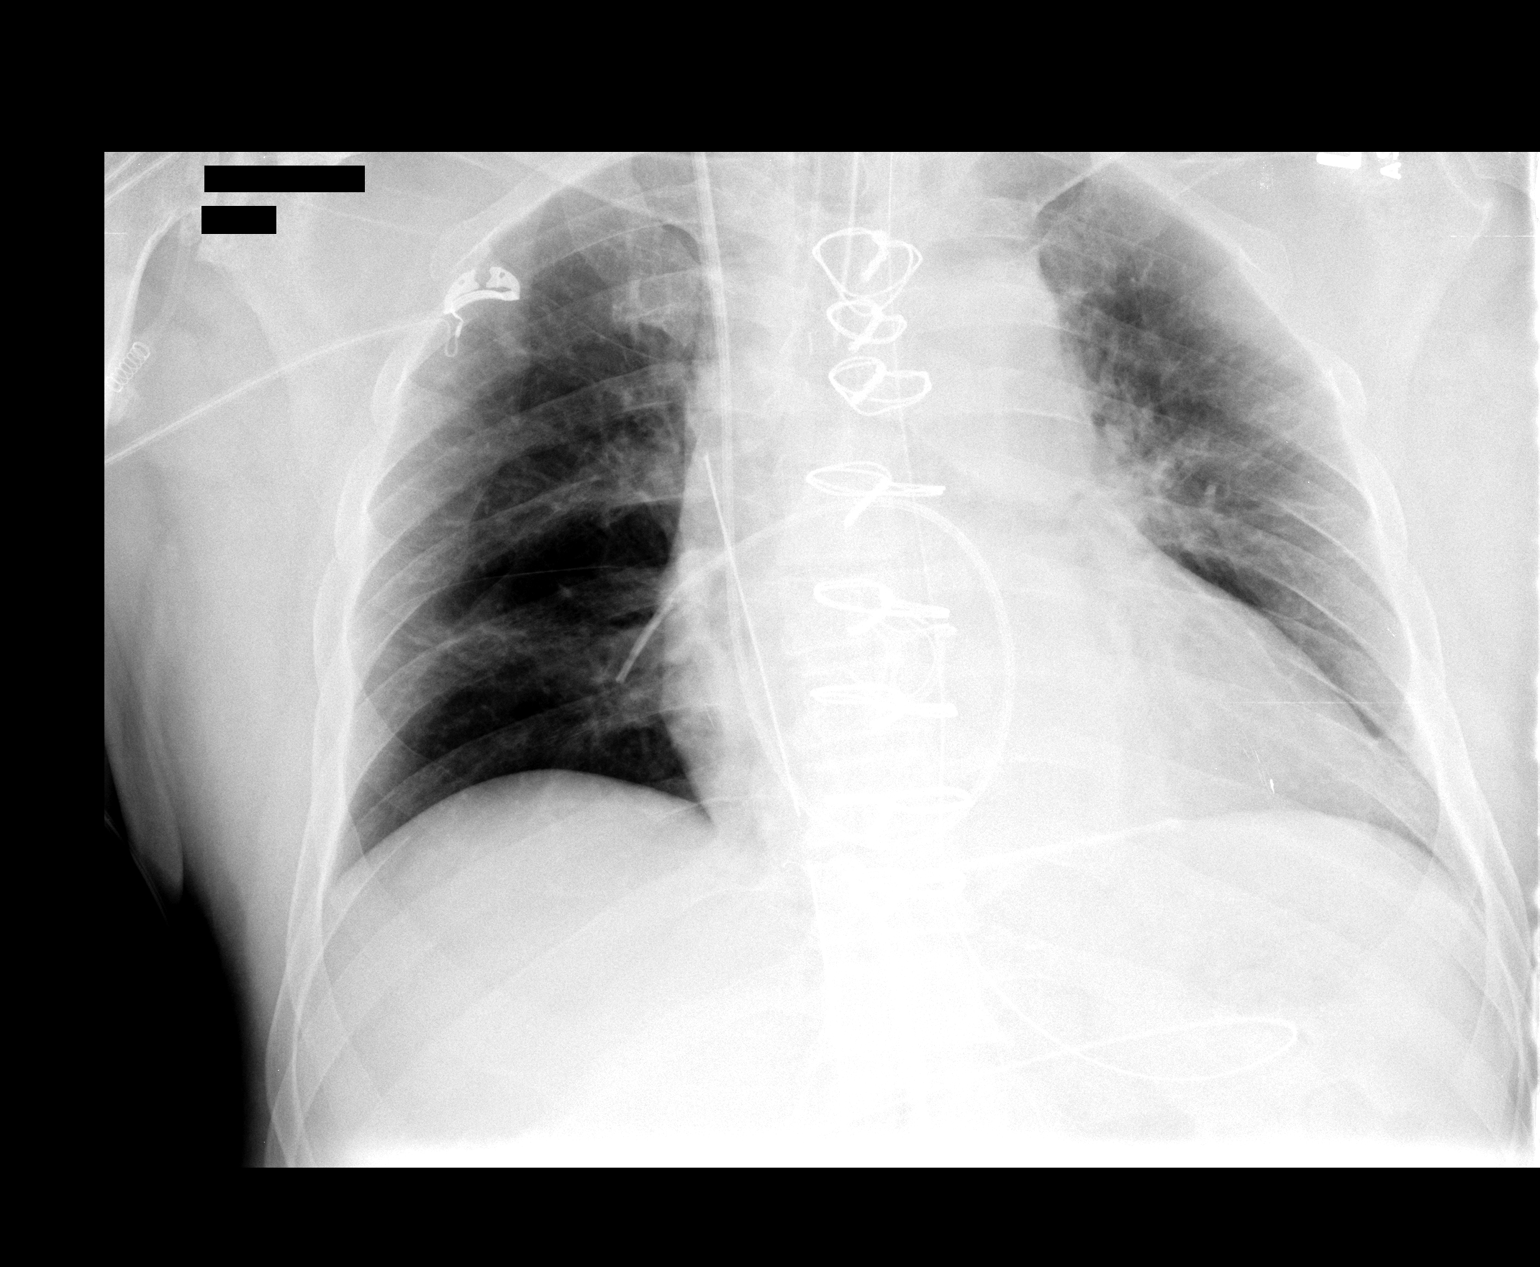

[1 of 1 positions shown; findings below may reference images not displayed]

FINDINGS: The patient is status post median sternotomy for aortic
valve replacement eight sternal wires are in place.  The patient is
intubated.  Endotracheal tube terminates 3.5 cm above the carina,
in satisfactory position.  A mediastinal drain is in place.
Cardiac pacing wires are noted.  An NG tube courses off the
inferior border of the film.  A Swan-Ganz catheter enters via a
right IJ sheath.  The tip is in the interlobar artery.  The lung
volumes are low.  Minimal bibasilar atelectasis is evident.
IMPRESSION: 1.  Status post median sternotomy for aortic valve replacement.
2.  The tip of the Swan-Ganz catheter is somewhat distal within the
right intralobar artery.
3.  Support apparatus is otherwise satisfactory.
4.  Low lung volumes and minimal bibasilar atelectasis.

## 2012-06-28 ENCOUNTER — Other Ambulatory Visit: Payer: Self-pay | Admitting: Cardiology

## 2012-06-28 IMAGING — CR DG CHEST 2V
2 series · 2 of 2 positions shown · non-contrast
Comparison: 04/18/2011

CLINICAL DATA: Postop from aortic valve replacement.  Follow-up
atelectasis.

CHEST - 2 VIEW

[w chest pa]
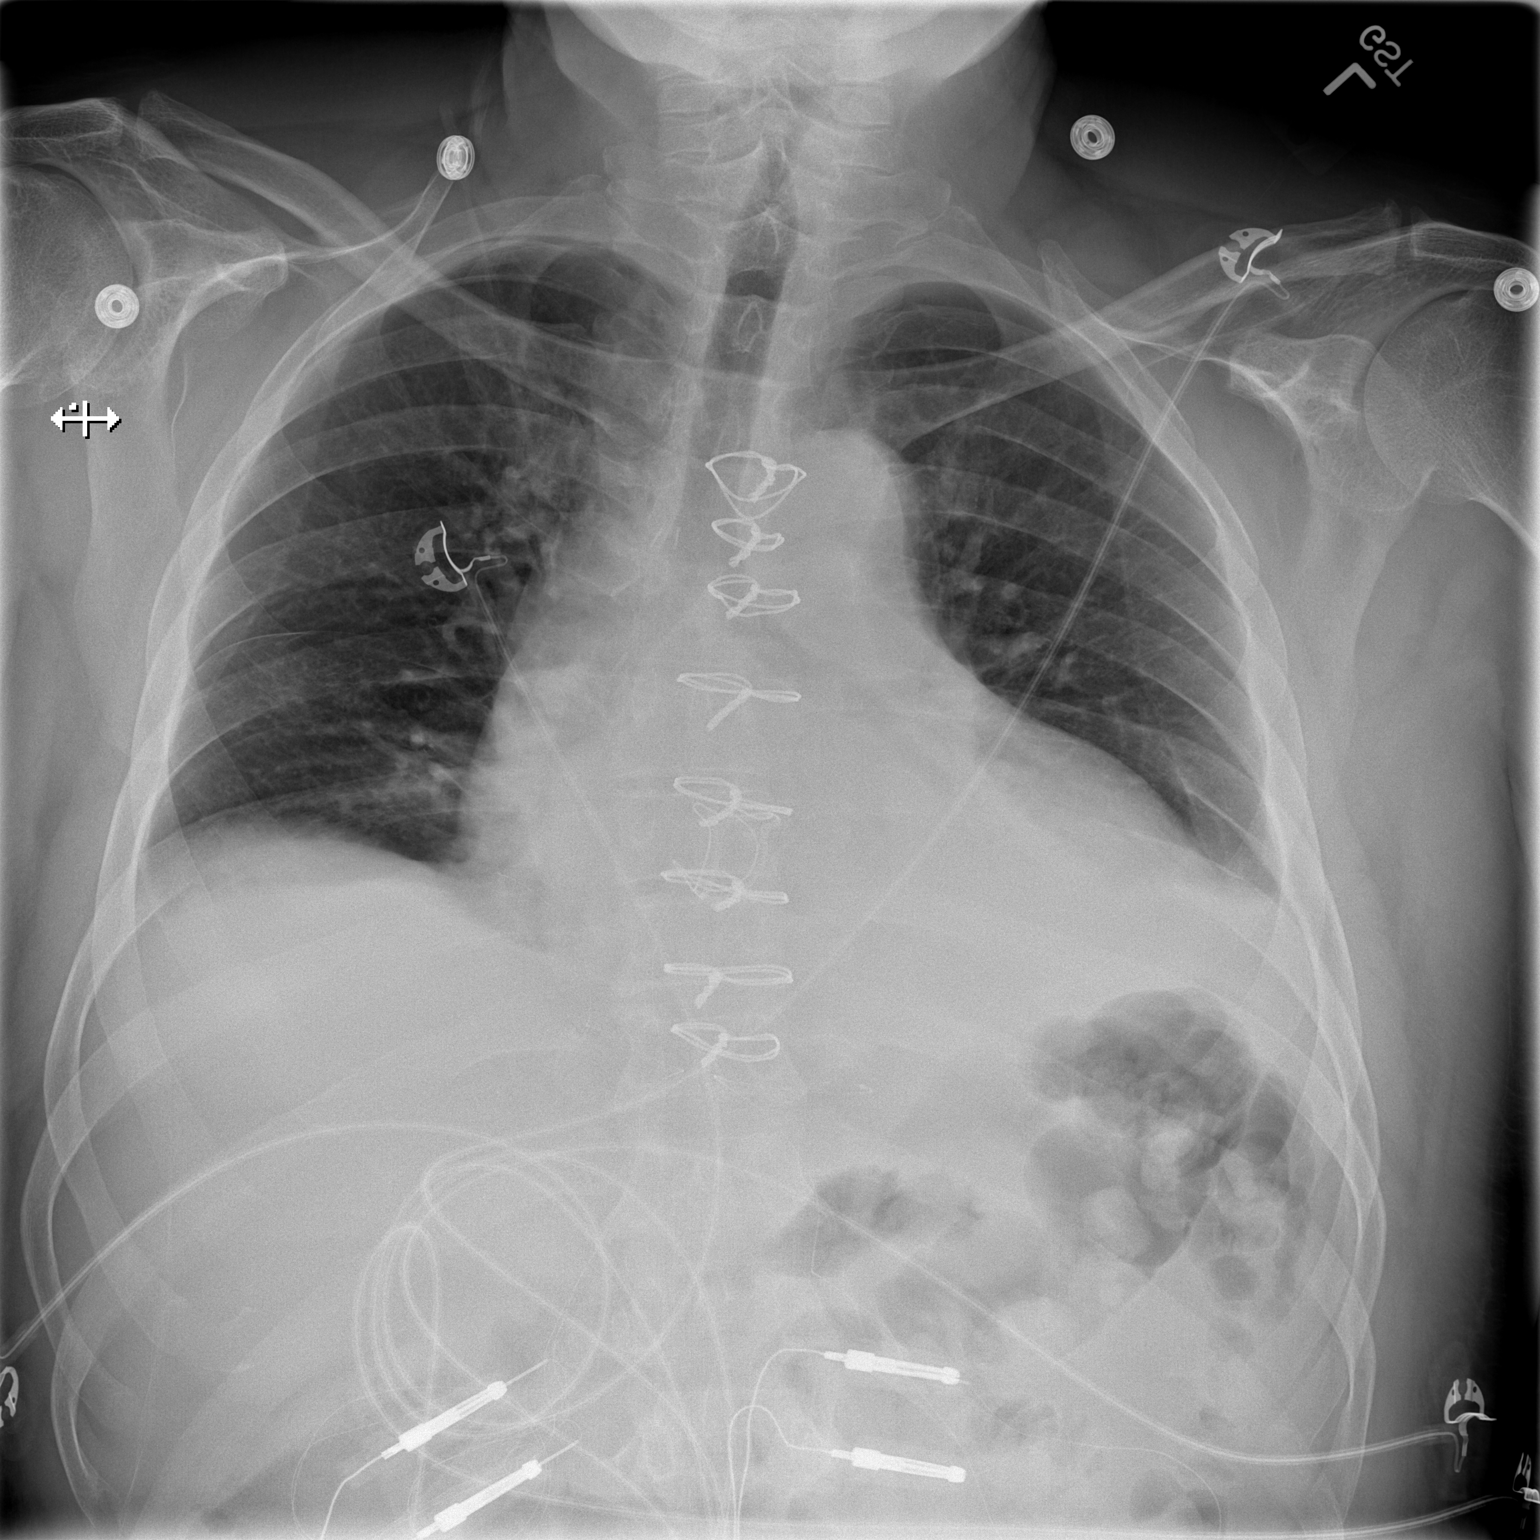

[w chest lat]
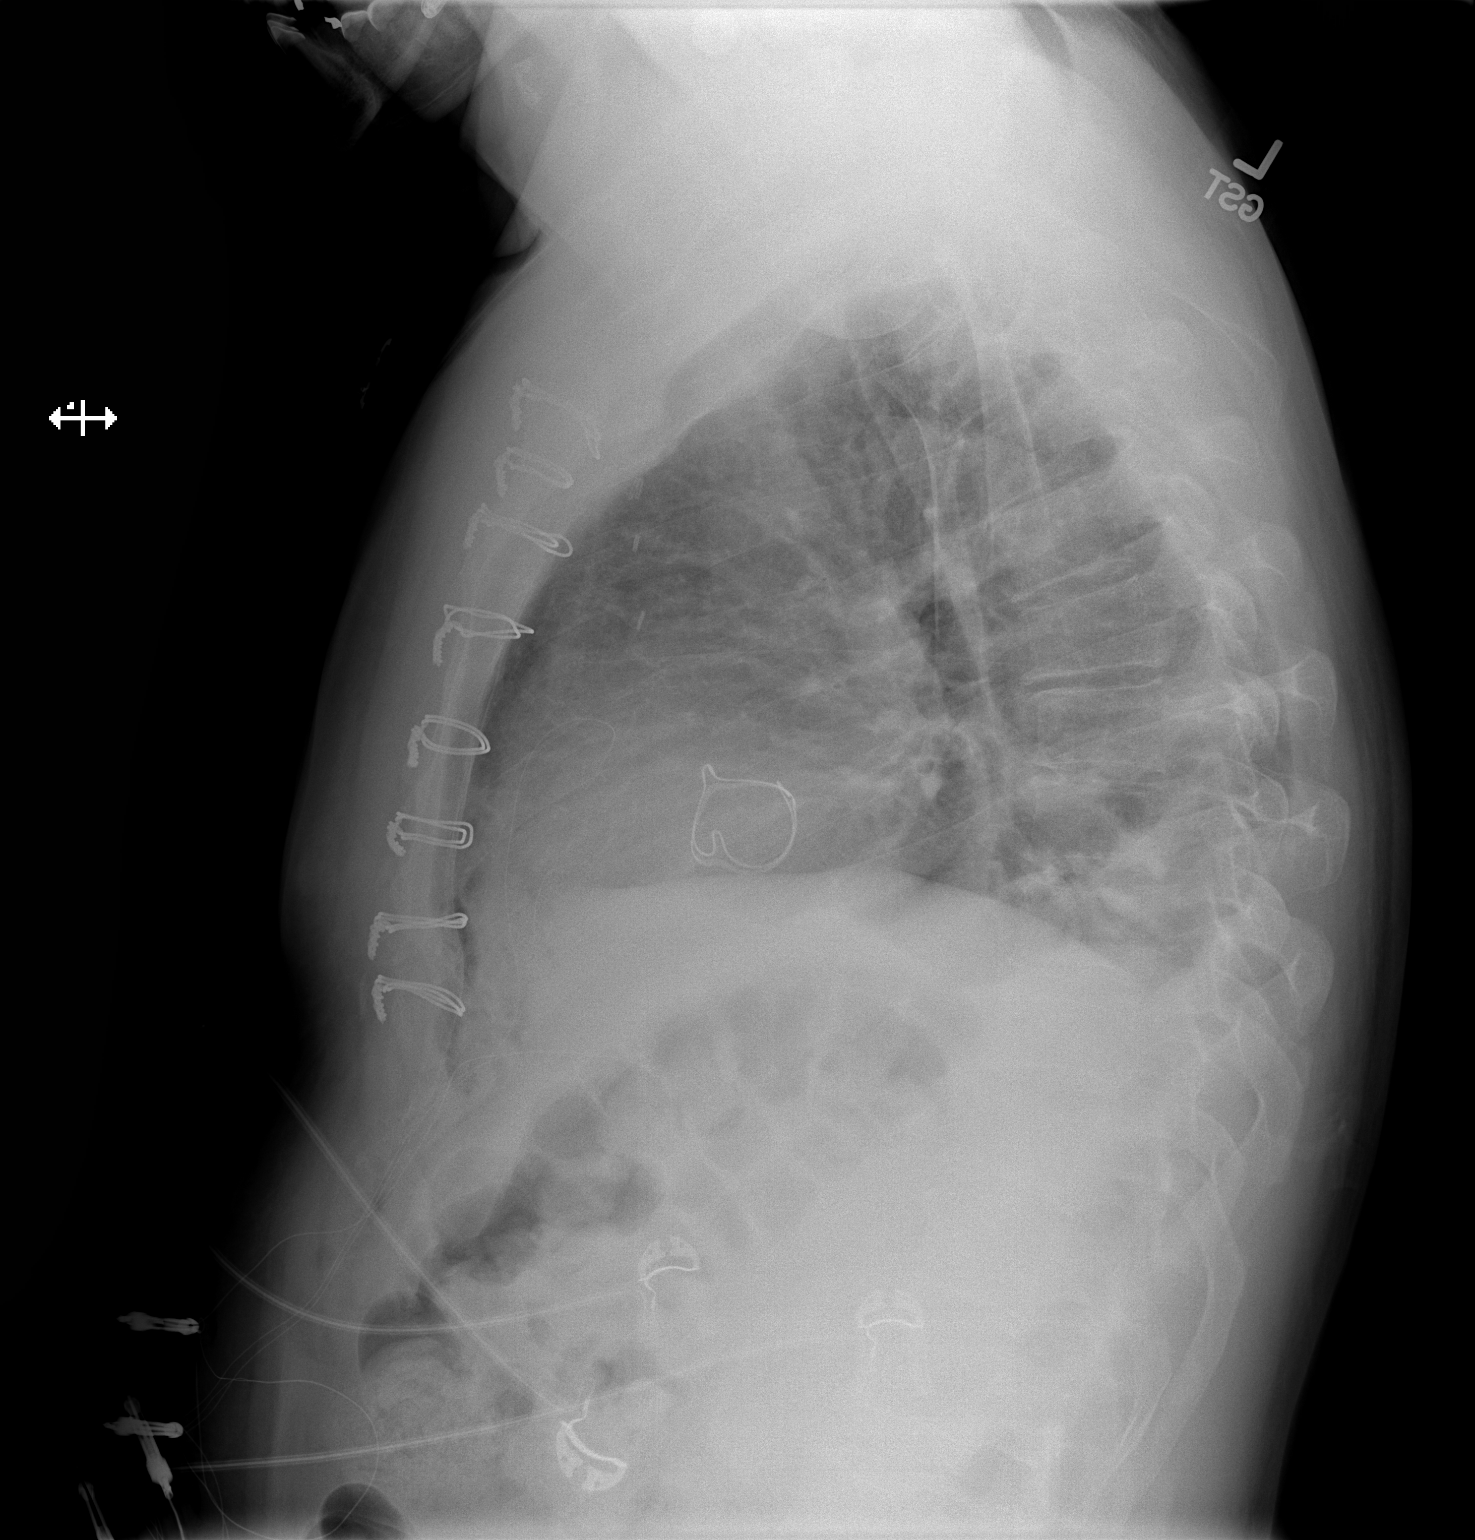

[2 of 2 positions shown; findings below may reference images not displayed]

FINDINGS: Persistent low lung volumes noted with persistent
atelectasis or infiltrate in the left retrocardiac lung base.  Tiny
pleural effusions noted bilaterally.  Cardiomegaly stable.  No
evidence of congestive heart failure.  Previous mediasternotomy and
aortic valve replacement noted.
IMPRESSION: Persistent low lung volumes, with left retrocardiac atelectasis or
infiltrate and tiny bilateral pleural effusions.

## 2012-07-18 ENCOUNTER — Encounter: Payer: Self-pay | Admitting: Adult Health

## 2012-07-18 ENCOUNTER — Ambulatory Visit (INDEPENDENT_AMBULATORY_CARE_PROVIDER_SITE_OTHER): Payer: 59 | Admitting: Adult Health

## 2012-07-18 VITALS — BP 108/92 | HR 65 | Ht 66.0 in | Wt 193.0 lb

## 2012-07-18 DIAGNOSIS — I351 Nonrheumatic aortic (valve) insufficiency: Secondary | ICD-10-CM

## 2012-07-18 DIAGNOSIS — I10 Essential (primary) hypertension: Secondary | ICD-10-CM

## 2012-07-18 DIAGNOSIS — I359 Nonrheumatic aortic valve disorder, unspecified: Secondary | ICD-10-CM

## 2012-07-18 MED ORDER — LISINOPRIL 2.5 MG PO TABS: 2.5000 mg | ORAL_TABLET | Freq: Every day | ORAL | Status: DC

## 2012-07-18 MED ORDER — CHLORTHALIDONE 25 MG PO TABS: 25.0000 mg | ORAL_TABLET | Freq: Every day | ORAL | Status: DC

## 2012-07-18 NOTE — Addendum Note (Signed)
Addended by: Thompson Grayer on: 07/18/2012 02:27 PM   Modules accepted: Orders

## 2012-07-18 NOTE — Assessment & Plan Note (Signed)
Excellent control of BP on current regimen. I will have BMET completed for kidney fx. Refills provided on chlorthalidone and lisinopril. Discussed MYCHART with the patient.

## 2012-07-18 NOTE — Assessment & Plan Note (Signed)
Doing well. No complaints of chest pain or DOE. Continue current medication regimen,

## 2012-07-18 NOTE — Progress Notes (Signed)
HPI: Mr. Justin [name] is a 57 year old patient formerly seen by Dr. Andee Lineman we are following for ongoing assessment treatment of hypertension, status post bioprosthetic surgical repair replacement of the aortic valve ([facility] magna ease) placed in March of 2013. Patient was last seen in the office in August of 2013. Blood pressure was still not controlled and therefore HCTZ was increased to 25 mg daily and potassium was increased to 20 mEq daily he was advised to avoid salty foods. The patient is here for followup of symptoms and blood pressure review.    Has been doing well. No hospitalizations, or ER visits. He is medically compliant.        No Known Allergies  Current Outpatient Prescriptions  Medication Sig Dispense Refill  . acetaminophen (TYLENOL) 500 MG tablet Take 1,000 mg by mouth every 6 (six) hours as needed.      . chlorthalidone (HYGROTON) 25 MG tablet Take 25 mg by mouth daily.      . Cholecalciferol (VITAMIN D3) 2000 UNITS TABS Take 1 tablet by mouth daily.        Marland Kitchen lisinopril (PRINIVIL,ZESTRIL) 2.5 MG tablet Take 2.5 mg by mouth daily.      . Multiple Vitamins-Minerals (CENTRUM SILVER PO) Take 1 tablet by mouth daily.        . Omega-3 Fatty Acids (FISH OIL CONCENTRATE PO) Take 1 tablet by mouth daily.        Marland Kitchen pyridoxine (B-6) 100 MG tablet Take 200 mg by mouth daily.       . vitamin B-12 (CYANOCOBALAMIN) 1000 MCG tablet Take 1,000 mcg by mouth daily.        . [DISCONTINUED] Olmesartan-Amlodipine-HCTZ (TRIBENZOR) 40-5-25 MG TABS Take 1 tablet by mouth daily.  30 tablet  6   No current facility-administered medications for this visit.    Past Medical History  Diagnosis Date  . HTN (hypertension)   . Aortic stenosis     Bicuspid Ao Valve (TEE 2003, TTE 2007),moderate aortic insufficiency  . S/P aortic valve replacement 04/16/2011    Secondary to severe aortic insufficiency with cusp prolapse.    Past Surgical History  Procedure Laterality Date  . Left arm surgery    .  Vasectomy    . Anal fissure repair    . Aortic valve replacement  04/16/2011    Procedure: AORTIC VALVE REPLACEMENT (AVR);  Surgeon: Purcell Nails, MD;  Location: [facility] OR;  Service: Open Heart Surgery;  Laterality: N/A;  aortic valve replacement    ZOX:WRUEAV of systems complete and found to be negative unless listed above  PHYSICAL EXAM BP 108/92  Pulse 65  Ht 5\' 6"  (1.676 m)  Wt 193 lb (87.544 kg)  BMI 31.17 kg/m2 General: Well developed, well nourished, in no acute distress Head: Eyes PERRLA, No xanthomas.   Normal cephalic and atramatic  Lungs: Clear bilaterally to auscultation and percussion. Heart: HRRR S1 S2, 1/6 systolic murmur,   Pulses are 2+ & equal.            No carotid bruit. No JVD.  No abdominal bruits. No femoral bruits. Abdomen: Bowel sounds are positive, abdomen soft and non-tender without masses or                  Hernia's noted. Msk:  Back normal, normal gait. Normal strength and tone for age. Extremities: No clubbing, cyanosis or edema.  DP +1 Neuro: Alert and oriented X 3. Psych:  Good affect, responds appropriately  EKG: NSR with LVH. Rate  of 66 bpm.  ASSESSMENT AND PLAN

## 2012-07-18 NOTE — Patient Instructions (Signed)
Your physician recommends that you schedule a follow-up appointment in: 6 MONTHS   Your physician recommends that you continue on your current medications as directed. Please refer to the Current Medication list given to you today.  

## 2012-07-21 ENCOUNTER — Other Ambulatory Visit: Payer: Self-pay | Admitting: *Deleted

## 2012-07-21 DIAGNOSIS — I10 Essential (primary) hypertension: Secondary | ICD-10-CM

## 2012-07-21 LAB — BASIC METABOLIC PANEL
BUN: 16 mg/dL (ref 6–23)
CO2: 29 mEq/L (ref 19–32)
Calcium: 9.7 mg/dL (ref 8.4–10.5)
Creat: 1.13 mg/dL (ref 0.50–1.35)
Glucose, Bld: 98 mg/dL (ref 70–99)
Sodium: 137 mEq/L (ref 135–145)

## 2012-07-26 ENCOUNTER — Encounter: Payer: Self-pay | Admitting: *Deleted

## 2012-07-30 ENCOUNTER — Encounter (HOSPITAL_COMMUNITY): Payer: Self-pay | Admitting: Adult Health

## 2012-07-30 ENCOUNTER — Emergency Department (HOSPITAL_COMMUNITY)
Admission: EM | Admit: 2012-07-30 | Discharge: 2012-07-30 | Disposition: A | Discharge status: Home / Self Care | Payer: 59 | Attending: Emergency Medicine | Admitting: Emergency Medicine

## 2012-07-30 DIAGNOSIS — M79609 Pain in unspecified limb: Secondary | ICD-10-CM

## 2012-07-30 DIAGNOSIS — Z79899 Other long term (current) drug therapy: Secondary | ICD-10-CM | POA: Insufficient documentation

## 2012-07-30 DIAGNOSIS — I10 Essential (primary) hypertension: Secondary | ICD-10-CM | POA: Insufficient documentation

## 2012-07-30 DIAGNOSIS — Z954 Presence of other heart-valve replacement: Secondary | ICD-10-CM | POA: Insufficient documentation

## 2012-07-30 MED ORDER — OXYCODONE-ACETAMINOPHEN 5-325 MG PO TABS: 1.0000 | ORAL_TABLET | Freq: Four times a day (QID) | ORAL | Status: DC | PRN

## 2012-07-30 MED ORDER — OXYCODONE-ACETAMINOPHEN 5-325 MG PO TABS
1.0000 | ORAL_TABLET | Freq: Once | ORAL | Status: DC
  Filled 2012-07-30 (×3): qty 1

## 2012-07-30 MED ORDER — OXYCODONE-ACETAMINOPHEN 5-325 MG PO TABS
1.0000 | ORAL_TABLET | Freq: Once | ORAL | Status: AC
  Administered 2012-07-30: 1 via ORAL

## 2012-07-30 MED ORDER — IBUPROFEN 400 MG PO TABS
400.0000 mg | ORAL_TABLET | Freq: Once | ORAL | Status: DC
  Filled 2012-07-30: qty 2

## 2012-07-30 NOTE — ED Notes (Signed)
Presents with sudden onset of left sided calf pain that began at 2300, pain radiates into lower back. Pain is described as cramping. Laying straight makes pain better, sitting up makes pain worse. Denies injury. Pain began while getting up from sitting position. CMS intact.

## 2012-07-30 NOTE — ED Notes (Signed)
RN prior to this rn reported she gave medication as ordered

## 2012-07-30 NOTE — Progress Notes (Signed)
VASCULAR LAB PRELIMINARY  PRELIMINARY  PRELIMINARY  PRELIMINARY  Left lower extremity venous Doppler completed.    Preliminary report:  There is no DVT or SVT noted in the left lower extremity.  Kamisha Ell, RVT 07/30/2012, 8:08 AM

## 2012-07-30 NOTE — ED Provider Notes (Signed)
History     CSN: [id number]  Arrival date & time 07/30/12  0244   First MD Initiated Contact with Patient 07/30/12 [phone]      Chief Complaint  Patient presents with  . Back Pain    (Consider location/radiation/quality/duration/timing/severity/associated sxs/prior treatment) HPI Comments: Patient presents today with a chief complaint of left lower extremity pain. Pain located in his left calf.  He reports that he began having the pain last evening and the pain has been constant since that time.  He denies any injury or trauma to the area.  He has not taken anything for the pain prior to arrival.  Pain is not worse with walking.  He does have the pain at rest.  He reports that he has never had pain like this before.  He denies any swelling or erythema of the calf.  He denies fever or chills.  No prior history of DVT or PE.  No prolonged travel or surgeries in the past 4 weeks.  He denies chest pain, SOB, or hemoptysis.  He feels like the calf pain does radiate up his leg at times.  However, he feels that the primary pain is the calf.  He denies numbness or tingling.  Denies fever or chills.  The history is provided by the patient.    Past Medical History  Diagnosis Date  . HTN (hypertension)   . Aortic stenosis     Bicuspid Ao Valve (TEE 2003, TTE 2007),moderate aortic insufficiency  . S/P aortic valve replacement 04/16/2011    Secondary to severe aortic insufficiency with cusp prolapse.    Past Surgical History  Procedure Laterality Date  . Left arm surgery    . Vasectomy    . Anal fissure repair    . Aortic valve replacement  04/16/2011    Procedure: AORTIC VALVE REPLACEMENT (AVR);  Surgeon: Purcell Nails, MD;  Location: [facility] OR;  Service: Open Heart Surgery;  Laterality: N/A;  aortic valve replacement    Family History  Problem Relation Age of Onset  . Cancer Father 3    History  Substance Use Topics  . Smoking status: Never Smoker   . Smokeless tobacco: Never Used  .  Alcohol Use: Yes     Comment: Occasionally--socially      Review of Systems  All other systems reviewed and are negative.    Allergies  Review of patient's allergies indicates no known allergies.  Home Medications   Current Outpatient Rx  Name  Route  Sig  Dispense  Refill  . acetaminophen (TYLENOL) 500 MG tablet   Oral   Take 1,000 mg by mouth every 6 (six) hours as needed for pain.          . chlorthalidone (HYGROTON) 25 MG tablet   Oral   Take 1 tablet (25 mg total) by mouth daily.   30 tablet   6   . Cholecalciferol (VITAMIN D3) 2000 UNITS TABS   Oral   Take 1 tablet by mouth daily.           Marland Kitchen ibuprofen (ADVIL,MOTRIN) 200 MG tablet   Oral   Take 400 mg by mouth every 6 (six) hours as needed for pain.         Marland Kitchen lisinopril (PRINIVIL,ZESTRIL) 2.5 MG tablet   Oral   Take 1 tablet (2.5 mg total) by mouth daily.   30 tablet   6   . Multiple Vitamins-Minerals (CENTRUM SILVER PO)   Oral   Take 1 tablet  by mouth daily.           . Omega-3 Fatty Acids (FISH OIL CONCENTRATE PO)   Oral   Take 1 tablet by mouth daily.           Marland Kitchen pyridoxine (B-6) 100 MG tablet   Oral   Take 200 mg by mouth daily.          . vitamin B-12 (CYANOCOBALAMIN) 1000 MCG tablet   Oral   Take 1,000 mcg by mouth daily.             BP 129/90  Pulse 73  Temp(Src) 97.8 F (36.6 C) (Oral)  Resp 24  SpO2 97%  Physical Exam  Nursing note and vitals reviewed. Constitutional: He appears well-developed and well-nourished. No distress.  HENT:  Head: Normocephalic and atraumatic.  Neck: Normal range of motion. Neck supple.  Cardiovascular: Normal rate, regular rhythm, normal heart sounds and intact distal pulses.   Pulses:      Dorsalis pedis pulses are 2+ on the right side, and 2+ on the left side.  Pulmonary/Chest: Effort normal and breath sounds normal.  Musculoskeletal: Normal range of motion.       Lumbar back: He exhibits normal range of motion, no tenderness, no  bony tenderness, no swelling, no edema and no deformity.  Positive Homan's sign of the LLE   Neurological: He is alert. No sensory deficit.  Full sensation of both feet  Skin: Skin is warm and dry. He is not diaphoretic. No erythema.  No erythema, edema, or warmth of the lower extremities bilaterally.  Psychiatric: He has a normal mood and affect.    ED Course  Procedures (including critical care time)  Labs Reviewed - No data to display No results found.   No diagnosis found.    MDM  Patient presenting with pain in his left calf.  No injury or trauma.  No signs of infection.  Venous doppler negative for DVT.  Patient has 2+ DP pulses bilaterally.  Pain could be muscular.  Will treat the patient with short course of pain medication.  Patient instructed to follow up with his PCP.        Pascal Lux [place], PA-C 07/30/12 2134

## 2012-07-31 ENCOUNTER — Encounter: Payer: Self-pay | Admitting: Physician Assistant

## 2012-07-31 ENCOUNTER — Ambulatory Visit (INDEPENDENT_AMBULATORY_CARE_PROVIDER_SITE_OTHER): Payer: 59 | Admitting: Physician Assistant

## 2012-07-31 ENCOUNTER — Ambulatory Visit (INDEPENDENT_AMBULATORY_CARE_PROVIDER_SITE_OTHER): Payer: 59

## 2012-07-31 VITALS — BP 137/98 | HR 83 | Temp 96.9°F | Wt 189.0 lb

## 2012-07-31 DIAGNOSIS — IMO0002 Reserved for concepts with insufficient information to code with codable children: Secondary | ICD-10-CM

## 2012-07-31 DIAGNOSIS — M5410 Radiculopathy, site unspecified: Secondary | ICD-10-CM

## 2012-07-31 DIAGNOSIS — M79609 Pain in unspecified limb: Secondary | ICD-10-CM

## 2012-07-31 MED ORDER — PREDNISONE (PAK) 10 MG PO TABS: 10.0000 mg | ORAL_TABLET | Freq: Every day | ORAL | Status: DC

## 2012-07-31 NOTE — Patient Instructions (Signed)
Radicular Pain Radicular pain in either the arm or leg is usually from a bulging or herniated disk in the spine. A piece of the herniated disk may press against the nerves as the nerves exit the spine. This causes pain which is felt at the tips of the nerves down the arm or leg. Other causes of radicular pain may include:  Fractures.  Heart disease.  Cancer.  An abnormal and usually degenerative state of the nervous system or nerves (neuropathy). Diagnosis may require CT or MRI scanning to determine the primary cause.  Nerves that start at the neck (nerve roots) may cause radicular pain in the outer shoulder and arm. It can spread down to the thumb and fingers. The symptoms vary depending on which nerve root has been affected. In most cases radicular pain improves with conservative treatment. Neck problems may require physical therapy, a neck collar, or cervical traction. Treatment may take many weeks, and surgery may be considered if the symptoms do not improve.  Conservative treatment is also recommended for sciatica. Sciatica causes pain to radiate from the lower back or buttock area down the leg into the foot. Often there is a history of back problems. Most patients with sciatica are better after 2 to 4 weeks of rest and other supportive care. Short term bed rest can reduce the disk pressure considerably. Sitting, however, is not a good position since this increases the pressure on the disk. You should avoid bending, lifting, and all other activities which make the problem worse. Traction can be used in severe cases. Surgery is usually reserved for patients who do not improve within the first months of treatment. Only take over-the-counter or prescription medicines for pain, discomfort, or fever as directed by your caregiver. Narcotics and muscle relaxants may help by relieving more severe pain and spasm and by providing mild sedation. Cold or massage can give significant relief. Spinal manipulation  is not recommended. It can increase the degree of disc protrusion. Epidural steroid injections are often effective treatment for radicular pain. These injections deliver medicine to the spinal nerve in the space between the protective covering of the spinal cord and back bones (vertebrae). Your caregiver can give you more information about steroid injections. These injections are most effective when given within two weeks of the onset of pain.  You should see your caregiver for follow up care as recommended. A program for neck and back injury rehabilitation with stretching and strengthening exercises is an important part of management.  SEEK IMMEDIATE MEDICAL CARE IF:  You develop increased pain, weakness, or numbness in your arm or leg.  You develop difficulty with bladder or bowel control.  You develop abdominal pain. Document Released: 03/04/2004 Document Revised: 04/19/2011 Document Reviewed: 05/20/2008 [employer] Patient Information 2014 [employer].  

## 2012-07-31 NOTE — ED Provider Notes (Signed)
Medical screening examination/treatment/procedure(s) were performed by non-physician practitioner and as supervising physician I was immediately available for consultation/collaboration.   Glynn Octave, MD 07/31/12 [phone]

## 2012-07-31 NOTE — Progress Notes (Signed)
Subjective:     Patient ID: Justin [name], male   DOB: [birth year], 57 y.o.   MRN: [id number]  HPI Pt here for f/u Pt with pain to the l lower leg last week Pain got so severe had to leave work via EMS and taken to [facility] ER There had eval to r/o DVT Results neg so given pain med and told to f/u prn Sx are to the lateral L lower leg with occasional pain all the way to the back Sx worse with sitting/standing and sx resolve with laying Denies any trauma No change in bowel/bladder function  Review of Systems  All other systems reviewed and are negative.       Objective:   Physical Exam  Nursing note and vitals reviewed. Pt appears uncomfortable No TTP the entire L leg Good strength Pulses/sensory good FROM knee/ankle No TTP L-spine FROM L-spine Xray lower leg/L-spine- leg no acute finding, L-spine with decrease at L5-S1     Assessment:     Leg pain- question radicular sx   Plan:     Cont pain med prn Heat Ice Sterapred dose pack-SE reviewed Work note F/U prn

## 2012-08-02 ENCOUNTER — Other Ambulatory Visit: Payer: Self-pay | Admitting: *Deleted

## 2012-08-02 ENCOUNTER — Encounter: Payer: Self-pay | Admitting: *Deleted

## 2012-08-02 DIAGNOSIS — M545 Low back pain: Secondary | ICD-10-CM

## 2012-08-03 ENCOUNTER — Other Ambulatory Visit: Payer: Self-pay

## 2012-08-03 NOTE — Telephone Encounter (Signed)
Patient needs more pain meds

## 2012-08-03 NOTE — Telephone Encounter (Signed)
Rx written for norco 7.5mg  #30 1 po qid prn He should be seen by spec and further rf should go through them

## 2012-08-07 ENCOUNTER — Encounter: Payer: Self-pay | Admitting: *Deleted

## 2012-08-07 ENCOUNTER — Telehealth: Payer: Self-pay | Admitting: *Deleted

## 2012-08-07 NOTE — Telephone Encounter (Signed)
REQUESTING CARDIAC CLEARANCE FOR BACK SURGERY. NOTHING HAS BEEN SCHEDULED YET. PT WAS JUST SEEN 07/18/12.  FAX [phone].

## 2012-08-07 NOTE — Telephone Encounter (Signed)
Please advise if pt needs surgical clearance OV, noted Ekg in chart for 07-18-12

## 2012-08-08 NOTE — Telephone Encounter (Signed)
NO need for cardiac pre-operative clearance unless pt is symptomatic.

## 2012-08-09 NOTE — Telephone Encounter (Signed)
Pt advised to send message to Dr Channing Mutters for clearance, forwarded notation from [facility] NP to Dr Channing Mutters per pt is NOT having any cardiac symptoms at this time

## 2012-08-09 NOTE — Telephone Encounter (Signed)
.  left message to have patient return my call.  

## 2012-08-14 ENCOUNTER — Telehealth: Payer: Self-pay | Admitting: *Deleted

## 2012-08-14 MED ORDER — HYDROCODONE-ACETAMINOPHEN 7.5-325 MG PO TABS: 1.0000 | ORAL_TABLET | Freq: Four times a day (QID) | ORAL | Status: DC | PRN

## 2012-08-14 NOTE — Telephone Encounter (Signed)
Give rx to joan to take to patient

## 2012-08-14 NOTE — Telephone Encounter (Signed)
Pt. Has been seeing Francisco Capuchin. He has an appt with Dr. Channing Mutters on Friday. He is going to run out of his Norco 7.5/325 today or tomorrow. Give the rx to [place] and she will take it to him. This is not on epic med list but it is in bills notes

## 2013-01-11 ENCOUNTER — Ambulatory Visit (INDEPENDENT_AMBULATORY_CARE_PROVIDER_SITE_OTHER): Payer: 59 | Admitting: Cardiovascular Disease

## 2013-01-11 ENCOUNTER — Encounter: Payer: Self-pay | Admitting: Cardiovascular Disease

## 2013-01-11 VITALS — BP 123/92 | HR 104 | Ht 66.0 in | Wt 203.0 lb

## 2013-01-11 DIAGNOSIS — Z952 Presence of prosthetic heart valve: Secondary | ICD-10-CM

## 2013-01-11 DIAGNOSIS — R000 Tachycardia, unspecified: Secondary | ICD-10-CM

## 2013-01-11 DIAGNOSIS — Z954 Presence of other heart-valve replacement: Secondary | ICD-10-CM

## 2013-01-11 DIAGNOSIS — I10 Essential (primary) hypertension: Secondary | ICD-10-CM

## 2013-01-11 DIAGNOSIS — I2510 Atherosclerotic heart disease of native coronary artery without angina pectoris: Secondary | ICD-10-CM

## 2013-01-11 MED ORDER — LISINOPRIL 10 MG PO TABS: 10.0000 mg | ORAL_TABLET | Freq: Every day | ORAL | Status: DC

## 2013-01-11 NOTE — Progress Notes (Signed)
Patient ID: Justin [name], male   DOB: Aug 17, 1955, 57 y.o.   MRN: [id number]      SUBJECTIVE: The patient is a 57 year old male with a past medical history significant for aortic valve replacement (bioprosthetic tissue valve on 04/16/2011) for a bicuspid aortic valve with consequent severe aortic insufficiency. He also has a history of hypertension. Cardiac catheterization yielded 50-60% mid RCA disease with otherwise normal coronaries; EF 65%. He denies chest pain, shortness of breath, lightheadedness, dizziness, palpitations, leg swelling, paroxysmal nocturnal dyspnea, orthopnea and syncope.  His heart rate is usually within normal limits. His systolic blood pressure is normally in the 120 mm mercury range but his diastolic readings have been 100 mmHg or higher.    No Known Allergies  Current Outpatient Prescriptions  Medication Sig Dispense Refill  . acetaminophen (TYLENOL) 500 MG tablet Take 1,000 mg by mouth every 6 (six) hours as needed for pain.       Marland Kitchen aspirin 81 MG tablet Take 81 mg by mouth daily.      . chlorthalidone (HYGROTON) 25 MG tablet Take 1 tablet (25 mg total) by mouth daily.  30 tablet  6  . Cholecalciferol (VITAMIN D3) 2000 UNITS TABS Take 1 tablet by mouth daily.        Marland Kitchen gabapentin (NEURONTIN) 300 MG capsule Take 300 mg by mouth 3 (three) times daily.      Marland Kitchen ibuprofen (ADVIL,MOTRIN) 200 MG tablet Take 400 mg by mouth every 6 (six) hours as needed for pain.      Marland Kitchen lisinopril (PRINIVIL,ZESTRIL) 2.5 MG tablet Take 1 tablet (2.5 mg total) by mouth daily.  30 tablet  6  . Multiple Vitamins-Minerals (CENTRUM SILVER PO) Take 1 tablet by mouth daily.        . Omega-3 Fatty Acids (FISH OIL CONCENTRATE PO) Take 1 tablet by mouth daily.        . Probiotic Product (PROBIOTIC DAILY PO) Take 1 tablet by mouth daily.      . vitamin B-12 (CYANOCOBALAMIN) 1000 MCG tablet Take 1,000 mcg by mouth daily.        Marland Kitchen HYDROcodone-acetaminophen (NORCO) 7.5-325 MG per tablet Take 1 tablet by  mouth every 6 (six) hours as needed for pain.  30 tablet  0  . [DISCONTINUED] Olmesartan-Amlodipine-HCTZ (TRIBENZOR) 40-5-25 MG TABS Take 1 tablet by mouth daily.  30 tablet  6   No current facility-administered medications for this visit.    Past Medical History  Diagnosis Date  . HTN (hypertension)   . Aortic stenosis     Bicuspid Ao Valve (TEE 2003, TTE 2007),moderate aortic insufficiency  . S/P aortic valve replacement 04/16/2011    Secondary to severe aortic insufficiency with cusp prolapse.    Past Surgical History  Procedure Laterality Date  . Left arm surgery    . Vasectomy    . Anal fissure repair    . Aortic valve replacement  04/16/2011    Procedure: AORTIC VALVE REPLACEMENT (AVR);  Surgeon: Purcell Nails, MD;  Location: [facility] OR;  Service: Open Heart Surgery;  Laterality: N/A;  aortic valve replacement    History   Social History  . Marital Status: Married    Spouse Name: N/A    Number of Children: 2  . Years of Education: N/A   Occupational History  . Mechanic    Social History Main Topics  . Smoking status: Never Smoker   . Smokeless tobacco: Never Used  . Alcohol Use: Yes     Comment:  Occasionally--socially  . Drug Use: No  . Sexual Activity: Not on file   Other Topics Concern  . Not on file   Social History Narrative  . No narrative on file     Filed Vitals:   01/11/13 1424  BP: 123/92  Pulse: 104  Height: 5\' 6"  (1.676 m)  Weight: 203 lb (92.08 kg)  SpO2: 96%    PHYSICAL EXAM General: NAD Neck: No JVD, no thyromegaly or thyroid nodule.  Lungs: Clear to auscultation bilaterally with normal respiratory effort. CV: Nondisplaced PMI.  Heart regular S1/prosthetic S2, no S3/S4,  Soft I/VI systolic murmur at RUSB.  No peripheral edema.  No carotid bruit.  Normal pedal pulses.  Abdomen: Soft, nontender, no hepatosplenomegaly, no distention.  Neurologic: Alert and oriented x 3.  Psych: Normal affect. Extremities: No clubbing or cyanosis.   ECG:  reviewed and available in electronic records. (sinus tachycardia, 105 bpm, late R wave transition, LVH with repolarization abnormality).      ASSESSMENT AND PLAN: 1. CAD: appears to be symptomatically stable with no exertional symptoms with respect to his known RCA lesion. Continue ASA 81 mg daily. 2. HTN: elevated diastolic readings. Will increase lisinopril to 10 mg daily. I have asked the patient to check blood pressure readings 4-5 times per week, at different times throughout the day, in order to get a better approximation of mean BP values. These results will be provided to me at the end of that period so that I can determine if antihypertensive medication titration is indicated. 3. S/p bioprosthetic AVR: stable, with no diastolic murmur appreciated.  Dispo: f/u 6 months.  Prentice Docker, M.D., F.A.C.C.

## 2013-01-11 NOTE — Patient Instructions (Signed)
   Increase Lisinopril to 10mg  daily - new sent to pharm Continue all other medications.   Your physician has requested that you regularly monitor and record your blood pressure readings at home. Please check at varied times of the day x 3 weeks, then return to office for MD review. Your physician wants you to follow up in: 6 months.  You will receive a reminder letter in the mail one-two months in advance.  If you don't receive a letter, please call our office to schedule the follow up appointment

## 2013-04-14 ENCOUNTER — Other Ambulatory Visit: Payer: Self-pay | Admitting: Adult Health

## 2013-08-16 ENCOUNTER — Other Ambulatory Visit: Payer: Self-pay | Admitting: *Deleted

## 2013-08-16 DIAGNOSIS — I10 Essential (primary) hypertension: Secondary | ICD-10-CM

## 2013-08-16 MED ORDER — LISINOPRIL 10 MG PO TABS: 10.0000 mg | ORAL_TABLET | Freq: Every day | ORAL | Status: DC

## 2013-09-18 ENCOUNTER — Telehealth: Payer: Self-pay | Admitting: Cardiovascular Disease

## 2013-09-18 DIAGNOSIS — I10 Essential (primary) hypertension: Secondary | ICD-10-CM

## 2013-09-18 NOTE — Telephone Encounter (Signed)
lisinopril (PRINIVIL,ZESTRIL) 10 MG tablet   [facility]  Appointment has been made

## 2013-09-19 MED ORDER — LISINOPRIL 10 MG PO TABS: 10.0000 mg | ORAL_TABLET | Freq: Every day | ORAL | Status: DC

## 2013-10-17 ENCOUNTER — Ambulatory Visit (INDEPENDENT_AMBULATORY_CARE_PROVIDER_SITE_OTHER): Payer: 59 | Admitting: Cardiovascular Disease

## 2013-10-17 ENCOUNTER — Encounter: Payer: Self-pay | Admitting: Cardiovascular Disease

## 2013-10-17 VITALS — BP 135/97 | HR 100 | Ht 66.0 in | Wt 200.0 lb

## 2013-10-17 DIAGNOSIS — R000 Tachycardia, unspecified: Secondary | ICD-10-CM

## 2013-10-17 DIAGNOSIS — I2510 Atherosclerotic heart disease of native coronary artery without angina pectoris: Secondary | ICD-10-CM

## 2013-10-17 DIAGNOSIS — I10 Essential (primary) hypertension: Secondary | ICD-10-CM

## 2013-10-17 DIAGNOSIS — Z954 Presence of other heart-valve replacement: Secondary | ICD-10-CM

## 2013-10-17 DIAGNOSIS — Z952 Presence of prosthetic heart valve: Secondary | ICD-10-CM

## 2013-10-17 MED ORDER — METOPROLOL SUCCINATE ER 25 MG PO TB24
25.0000 mg | ORAL_TABLET | Freq: Every day | ORAL | Status: DC
Start: 2013-10-17 — End: 2014-05-06

## 2013-10-17 MED ORDER — LISINOPRIL 20 MG PO TABS
20.0000 mg | ORAL_TABLET | Freq: Every day | ORAL | Status: DC
Start: 2013-10-17 — End: 2014-04-25

## 2013-10-17 NOTE — Progress Notes (Signed)
Patient ID: Justin [name], male   DOB: 02-21-1955, 58 y.o.   MRN: [id number]      SUBJECTIVE: The patient is a 58 year old male with a past medical history significant for aortic valve replacement (bioprosthetic tissue valve on 04/16/2011) for a bicuspid aortic valve with consequent severe aortic insufficiency. He also has a history of hypertension. Cardiac catheterization yielded 50-60% mid RCA disease with otherwise normal coronaries; EF 65%. He denies orthopnea, leg swelling and PND, as well as palpitations. His diastolic BP remains elevated, also noted at other provider's offices over the past year. He occasionally has exertional chest tightness and dyspnea, but is able to walk 3 miles without difficulty.    Review of Systems: As per "subjective", otherwise negative.  No Known Allergies  Current Outpatient Prescriptions  Medication Sig Dispense Refill  . acetaminophen (TYLENOL) 500 MG tablet Take 1,000 mg by mouth every 6 (six) hours as needed for pain.       Marland Kitchen aspirin 81 MG tablet Take 81 mg by mouth daily.      . chlorthalidone (HYGROTON) 25 MG tablet TAKE 1 TABLET BY MOUTH EVERY DAY  30 tablet  6  . Cholecalciferol (VITAMIN D3) 2000 UNITS TABS Take 1 tablet by mouth daily.        Marland Kitchen gabapentin (NEURONTIN) 400 MG capsule Take 400 mg by mouth daily.      Marland Kitchen ibuprofen (ADVIL,MOTRIN) 200 MG tablet Take 400 mg by mouth every 6 (six) hours as needed for pain.      Marland Kitchen lisinopril (PRINIVIL,ZESTRIL) 10 MG tablet Take 1 tablet (10 mg total) by mouth daily.  30 tablet  0  . Multiple Vitamins-Minerals (CENTRUM SILVER PO) Take 1 tablet by mouth daily.        . Omega-3 Fatty Acids (FISH OIL CONCENTRATE PO) Take 1 tablet by mouth daily.        . Probiotic Product (PROBIOTIC DAILY PO) Take 1 tablet by mouth daily.      . vitamin B-12 (CYANOCOBALAMIN) 1000 MCG tablet Take 1,000 mcg by mouth daily.        . [DISCONTINUED] Olmesartan-Amlodipine-HCTZ (TRIBENZOR) 40-5-25 MG TABS Take 1 tablet by mouth  daily.  30 tablet  6   No current facility-administered medications for this visit.    Past Medical History  Diagnosis Date  . HTN (hypertension)   . Aortic stenosis     Bicuspid Ao Valve (TEE 2003, TTE 2007),moderate aortic insufficiency  . S/P aortic valve replacement 04/16/2011    Secondary to severe aortic insufficiency with cusp prolapse.    Past Surgical History  Procedure Laterality Date  . Left arm surgery    . Vasectomy    . Anal fissure repair    . Aortic valve replacement  04/16/2011    Procedure: AORTIC VALVE REPLACEMENT (AVR);  Surgeon: Purcell Nails, MD;  Location: [facility] OR;  Service: Open Heart Surgery;  Laterality: N/A;  aortic valve replacement    History   Social History  . Marital Status: Married    Spouse Name: N/A    Number of Children: 2  . Years of Education: N/A   Occupational History  . Mechanic    Social History Main Topics  . Smoking status: Never Smoker   . Smokeless tobacco: Never Used  . Alcohol Use: Yes     Comment: Occasionally--socially  . Drug Use: No  . Sexual Activity: Not on file   Other Topics Concern  . Not on file   Social History Narrative  .  No narrative on file     Filed Vitals:   10/17/13 1359  BP: 135/97  Pulse: 100  Height:  (1.676 m)  Weight: 200 lb (90.719 kg)  SpO2: 98%    PHYSICAL EXAM General: NAD  Neck: No JVD, no thyromegaly or thyroid nodule.  Lungs: Clear to auscultation bilaterally with normal respiratory effort.  CV: Nondisplaced PMI. Heart regular S1/prosthetic S2, no S3/S4, Soft I/VI systolic murmur at RUSB. No peripheral edema. No carotid bruit. Normal pedal pulses.  Abdomen: Soft, nontender, no hepatosplenomegaly, no distention.  Neurologic: Alert and oriented x 3.  Psych: Normal affect. Skin: Normal. Musculoskeletal: Normal range of motion, no gross deformities. Extremities: No clubbing or cyanosis.   ECG: Most recent ECG reviewed.      ASSESSMENT AND PLAN: 1. CAD: Stable  ischemic heart disease. No exertional symptoms with respect to his known RCA lesion. Continue ASA 81 mg daily.  2. Essential HTN: Elevated diastolic readings. Will increase lisinopril to 20 mg twice daily.  3. S/p bioprosthetic AVR: Stable, with no diastolic murmur appreciated.  4. Tachycardia: EF 45-50% on 04/20/11. Will start Toprol-XL 25 mg daily to avoid the development of a tachycardia-mediated cardiomyopathy.  Dispo: f/u 1 year.   Prentice Docker, M.D., F.A.C.C.

## 2013-10-17 NOTE — Patient Instructions (Signed)
   Increase Lisinopril to  daily   Begin Toprol XL  daily  New meds sent to pharm Continue all other medications.   Your physician wants you to follow up in:  1 year.  You will receive a reminder letter in the mail one-two months in advance.  If you don't receive a letter, please call our office to schedule the follow up appointment

## 2013-11-19 ENCOUNTER — Other Ambulatory Visit: Payer: Self-pay | Admitting: *Deleted

## 2013-11-19 MED ORDER — CHLORTHALIDONE 25 MG PO TABS: 25.0000 mg | ORAL_TABLET | Freq: Every day | ORAL | Status: DC

## 2014-04-25 ENCOUNTER — Other Ambulatory Visit: Payer: Self-pay | Admitting: Cardiovascular Disease

## 2014-04-25 DIAGNOSIS — I10 Essential (primary) hypertension: Secondary | ICD-10-CM

## 2014-04-25 MED ORDER — CHLORTHALIDONE 25 MG PO TABS: 25.0000 mg | ORAL_TABLET | Freq: Every day | ORAL | Status: DC

## 2014-04-25 MED ORDER — LISINOPRIL 20 MG PO TABS: 20.0000 mg | ORAL_TABLET | Freq: Every day | ORAL | Status: DC

## 2014-04-25 NOTE — Telephone Encounter (Signed)
Medication sent to pharmacy  

## 2014-04-25 NOTE — Telephone Encounter (Signed)
Patient is requesting refills on chlorthalidone (HYGROTON) 25 MG tablet  And lisinopril 20 mg  [facility]

## 2014-05-06 ENCOUNTER — Telehealth: Payer: Self-pay | Admitting: *Deleted

## 2014-05-06 MED ORDER — METOPROLOL SUCCINATE ER 25 MG PO TB24: 25.0000 mg | ORAL_TABLET | Freq: Every day | ORAL | Status: DC

## 2014-05-06 NOTE — Telephone Encounter (Signed)
Refill request for metoprolol 25 mg from [facility]. Medication sent to pharmacy.  

## 2014-06-27 ENCOUNTER — Encounter (INDEPENDENT_AMBULATORY_CARE_PROVIDER_SITE_OTHER): Payer: Self-pay | Admitting: *Deleted

## 2014-06-27 NOTE — Telephone Encounter (Signed)
This encounter was created in error - please disregard.

## 2014-11-05 ENCOUNTER — Ambulatory Visit: Payer: Self-pay | Admitting: Cardiovascular Disease

## 2014-11-19 ENCOUNTER — Ambulatory Visit (INDEPENDENT_AMBULATORY_CARE_PROVIDER_SITE_OTHER): Payer: Commercial Managed Care - HMO | Admitting: Cardiovascular Disease

## 2014-11-19 ENCOUNTER — Encounter: Payer: Self-pay | Admitting: Cardiovascular Disease

## 2014-11-19 VITALS — BP 112/92 | HR 90 | Ht 66.0 in | Wt 199.0 lb

## 2014-11-19 DIAGNOSIS — I2510 Atherosclerotic heart disease of native coronary artery without angina pectoris: Secondary | ICD-10-CM | POA: Diagnosis not present

## 2014-11-19 DIAGNOSIS — Z954 Presence of other heart-valve replacement: Secondary | ICD-10-CM

## 2014-11-19 DIAGNOSIS — I10 Essential (primary) hypertension: Secondary | ICD-10-CM

## 2014-11-19 DIAGNOSIS — R42 Dizziness and giddiness: Secondary | ICD-10-CM | POA: Diagnosis not present

## 2014-11-19 DIAGNOSIS — Z952 Presence of prosthetic heart valve: Secondary | ICD-10-CM

## 2014-11-19 NOTE — Patient Instructions (Signed)
Continue all current medications. Your physician wants you to follow up in:  1 year.  You will receive a reminder letter in the mail one-two months in advance.  If you don't receive a letter, please call our office to schedule the follow up appointment   

## 2014-11-19 NOTE — Progress Notes (Signed)
Patient ID: Justin [name], male   DOB: [birth year], 57 y.o.   MRN: [id number]      SUBJECTIVE: The patient presents for routine follow up. He has a past medical history significant for aortic valve replacement (bioprosthetic tissue valve on 04/16/2011) for a bicuspid aortic valve with consequent severe aortic insufficiency. He also has a history of hypertension. Cardiac catheterization yielded 50-60% mid RCA disease with otherwise normal coronaries; EF 65%.  He is doing well. His only complaint relates to dizziness if he bends over and stands up too quickly. He tries to drink two 16-20 ounce bottles of water daily. He walks his dog 2 miles daily without difficulty. He denies exertional chest pain and dyspnea, as well as leg swelling. 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  ECG performed in the office today demonstrates normal sinus rhythm with a nonspecific ST segment and T-wave abnormality.  Review of Systems: As per "subjective", otherwise negative.  No Known Allergies  Current Outpatient Prescriptions  Medication Sig Dispense Refill  . acetaminophen (TYLENOL) 500 MG tablet Take 1,000 mg by mouth every 6 (six) hours as needed for pain.     Marland Kitchen amoxicillin (AMOXIL) 500 MG tablet Take 500 mg by mouth. Only takes 1 hour prior to dental procedures    . aspirin 81 MG tablet Take 81 mg by mouth daily.    . chlorthalidone (HYGROTON) 25 MG tablet Take 1 tablet (25 mg total) by mouth daily. 30 tablet 6  . Cholecalciferol (VITAMIN D3) 2000 UNITS TABS Take 1 tablet by mouth daily.      Marland Kitchen ibuprofen (ADVIL,MOTRIN) 200 MG tablet Take 400 mg by mouth every 6 (six) hours as needed for pain.    Marland Kitchen lisinopril (PRINIVIL,ZESTRIL) 20 MG tablet Take 1 tablet (20 mg total) by mouth daily. 30 tablet 6  . Multiple Vitamins-Minerals (CENTRUM SILVER PO) Take 1 tablet by mouth daily.      . Omega-3 Fatty Acids (FISH OIL CONCENTRATE PO) Take 1 tablet by mouth daily.      . Probiotic  Product (PROBIOTIC DAILY PO) Take 1 tablet by mouth daily.    . vitamin B-12 (CYANOCOBALAMIN) 1000 MCG tablet Take 1,000 mcg by mouth daily.      . [DISCONTINUED] Olmesartan-Amlodipine-HCTZ (TRIBENZOR) 40-5-25 MG TABS Take 1 tablet by mouth daily. 30 tablet 6   No current facility-administered medications for this visit.    Past Medical History  Diagnosis Date  . HTN (hypertension)   . Aortic stenosis     Bicuspid Ao Valve (TEE 2003, TTE 2007),moderate aortic insufficiency  . S/P aortic valve replacement 04/16/2011    Secondary to severe aortic insufficiency with cusp prolapse.    Past Surgical History  Procedure Laterality Date  . Left arm surgery    . Vasectomy    . Anal fissure repair    . Aortic valve replacement  04/16/2011    Procedure: AORTIC VALVE REPLACEMENT (AVR);  Surgeon: Purcell Nails, MD;  Location: [facility] OR;  Service: Open Heart Surgery;  Laterality: N/A;  aortic valve replacement    Social History   Social History  . Marital Status: Married    Spouse Name: N/A  . Number of Children: 2  . Years of Education: N/A   Occupational History  . Mechanic    Social History Main Topics  . Smoking status: Never Smoker   . Smokeless tobacco: Never Used  . Alcohol Use: Yes  Comment: Occasionally--socially  . Drug Use: No  . Sexual Activity: Not on file   Other Topics Concern  . Not on file   Social History Narrative     Filed Vitals:   11/19/14 0907  BP: 112/92  Pulse: 90  Height:  (1.676 m)  Weight: 199 lb (90.266 kg)  SpO2: 97%    PHYSICAL EXAM General: NAD  Neck: No JVD, no thyromegaly or thyroid nodule.  Lungs: Clear to auscultation bilaterally with normal respiratory effort.  CV: Nondisplaced PMI. Heart regular S1/prosthetic S2, no S3/S4, Soft I/VI systolic murmur at RUSB. No peripheral edema. No carotid bruit.  Abdomen: Soft, nontender, no distention.  Neurologic: Alert and oriented x 3.  Psych: Normal affect. Skin:  Normal. Musculoskeletal: Normal range of motion, no gross deformities. Extremities: No clubbing or cyanosis.   ECG: Most recent ECG reviewed.      ASSESSMENT AND PLAN: 1. CAD: Stable ischemic heart disease. No exertional symptoms with respect to his known RCA lesion. Continue ASA 81 mg daily.   2. Essential HTN: Mildly increased diastolic BP. Given normal SBP I am not inclined to adjust antihypertensives.  3. S/p bioprosthetic AVR: Stable, with no diastolic murmur appreciated.   4. Dizziness: Encouraged to move from bending to standing position more slowly, and to stay more adequately hydrated given his level of physical exertion.  Dispo: f/u 1 year.   Prentice Docker, M.D., F.A.C.C.

## 2014-11-25 ENCOUNTER — Telehealth: Payer: Self-pay | Admitting: Cardiovascular Disease

## 2014-11-25 ENCOUNTER — Other Ambulatory Visit: Payer: Self-pay | Admitting: *Deleted

## 2014-11-25 DIAGNOSIS — I10 Essential (primary) hypertension: Secondary | ICD-10-CM

## 2014-11-25 MED ORDER — CHLORTHALIDONE 25 MG PO TABS: 25.0000 mg | ORAL_TABLET | Freq: Every day | ORAL | Status: DC

## 2014-11-25 MED ORDER — LISINOPRIL 20 MG PO TABS: 20.0000 mg | ORAL_TABLET | Freq: Every day | ORAL | Status: DC

## 2014-11-25 NOTE — Telephone Encounter (Signed)
Mr. Justin [name] needs refill on the following Lisinopril & Chlorthalidone [facility] [place]

## 2015-11-12 ENCOUNTER — Encounter: Payer: Self-pay | Admitting: *Deleted

## 2015-11-13 ENCOUNTER — Ambulatory Visit (INDEPENDENT_AMBULATORY_CARE_PROVIDER_SITE_OTHER): Payer: Commercial Managed Care - HMO | Admitting: Cardiovascular Disease

## 2015-11-13 ENCOUNTER — Encounter: Payer: Self-pay | Admitting: Cardiovascular Disease

## 2015-11-13 VITALS — BP 108/78 | HR 80 | Ht 66.0 in | Wt 194.0 lb

## 2015-11-13 DIAGNOSIS — I10 Essential (primary) hypertension: Secondary | ICD-10-CM

## 2015-11-13 DIAGNOSIS — Z952 Presence of prosthetic heart valve: Secondary | ICD-10-CM

## 2015-11-13 DIAGNOSIS — I2510 Atherosclerotic heart disease of native coronary artery without angina pectoris: Secondary | ICD-10-CM | POA: Diagnosis not present

## 2015-11-13 MED ORDER — LISINOPRIL 20 MG PO TABS: 20.0000 mg | ORAL_TABLET | Freq: Every day | ORAL | 3 refills | Status: DC

## 2015-11-13 MED ORDER — CHLORTHALIDONE 25 MG PO TABS: 25.0000 mg | ORAL_TABLET | Freq: Every day | ORAL | 3 refills | Status: DC

## 2015-11-13 NOTE — Patient Instructions (Signed)

## 2015-11-13 NOTE — Progress Notes (Signed)
SUBJECTIVE: The patient presents for routine follow up. He has a past medical history significant for aortic valve replacement (bioprosthetic tissue valve on 04/16/2011) for a bicuspid aortic valve with consequent severe aortic insufficiency. He also has a history of hypertension. Cardiac catheterization yielded 50-60% mid RCA disease with otherwise normal coronaries; EF 65%.  He is doing well. Denies chest pain and SOB. Sometimes gets dizzy when trying to run, denies syncope. Walks his dog a lot.  ECG shows NSR with possible old inferior infarct.   Review of Systems: As per "subjective", otherwise negative.  No Known Allergies  Current Outpatient Prescriptions  Medication Sig Dispense Refill  . acetaminophen (TYLENOL) 500 MG tablet Take 1,000 mg by mouth every 6 (six) hours as needed for pain.     Marland Kitchen amoxicillin (AMOXIL) 500 MG tablet Take 500 mg by mouth. Only takes 1 hour prior to dental procedures    . aspirin 81 MG tablet Take 81 mg by mouth daily.    . chlorthalidone (HYGROTON) 25 MG tablet Take 1 tablet (25 mg total) by mouth daily. 30 tablet 11  . Cholecalciferol (VITAMIN D3) 2000 UNITS TABS Take 1 tablet by mouth daily.      Marland Kitchen ibuprofen (ADVIL,MOTRIN) 200 MG tablet Take 400 mg by mouth every 6 (six) hours as needed for pain.    Marland Kitchen lisinopril (PRINIVIL,ZESTRIL) 20 MG tablet Take 1 tablet (20 mg total) by mouth daily. 30 tablet 11  . Multiple Vitamins-Minerals (CENTRUM SILVER PO) Take 1 tablet by mouth daily.      . Omega-3 Fatty Acids (FISH OIL CONCENTRATE PO) Take 1 tablet by mouth daily.      . Probiotic Product (PROBIOTIC DAILY PO) Take 1 tablet by mouth daily.    . vitamin B-12 (CYANOCOBALAMIN) 1000 MCG tablet Take 1,000 mcg by mouth daily.       No current facility-administered medications for this visit.     Past Medical History:  Diagnosis Date  . Aortic stenosis    Bicuspid Ao Valve (TEE 2003, TTE 2007),moderate aortic insufficiency  . HTN (hypertension)   .  S/P aortic valve replacement 04/16/2011   Secondary to severe aortic insufficiency with cusp prolapse.    Past Surgical History:  Procedure Laterality Date  . ANAL FISSURE REPAIR    . AORTIC VALVE REPLACEMENT  04/16/2011   Procedure: AORTIC VALVE REPLACEMENT (AVR);  Surgeon: Purcell Nails, MD;  Location: [facility] OR;  Service: Open Heart Surgery;  Laterality: N/A;  aortic valve replacement  . Left arm surgery    . VASECTOMY      Social History   Social History  . Marital status: Married    Spouse name: N/A  . Number of children: 2  . Years of education: N/A   Occupational History  . Mechanic    Social History Main Topics  . Smoking status: Never Smoker  . Smokeless tobacco: Never Used  . Alcohol use Yes     Comment: Occasionally--socially  . Drug use: No  . Sexual activity: Not on file   Other Topics Concern  . Not on file   Social History Narrative  . No narrative on file     Vitals:   11/13/15 0807  BP: 108/78  Pulse: 80  SpO2: 97%  Weight: 194 lb (88 kg)  Height: 5\' 6"  (1.676 m)    PHYSICAL EXAM General: NAD  Neck: No JVD, no thyromegaly or thyroid nodule.  Lungs: Clear to auscultation bilaterally with normal respiratory  effort.  CV: Nondisplaced PMI. Heart regular S1/prosthetic S2, no S3/S4, Soft I/VI systolic murmur at RUSB. No peripheral edema. No carotid bruit.  Abdomen: Soft, nontender, no distention.  Neurologic: Alert and oriented x 3.  Psych: Normal affect. Skin: Normal. Musculoskeletal: Normal range of motion, no gross deformities. Extremities: No clubbing or cyanosis.     ECG: Most recent ECG reviewed.      ASSESSMENT AND PLAN: 1. CAD: Stable ischemic heart disease. No exertional symptoms with respect to his known RCA lesion. Continue ASA 81 mg daily.   2. Essential HTN: Controlled. No changes. DBP now within normal limits.  3. S/p bioprosthetic AVR: Stable, with no diastolic murmur appreciated.   Dispo: fu 1 year   Prentice DockerSuresh  Onie Kasparek, M.D., F.A.C.C.

## 2016-03-30 ENCOUNTER — Ambulatory Visit (INDEPENDENT_AMBULATORY_CARE_PROVIDER_SITE_OTHER): Payer: Self-pay | Admitting: Nurse Practitioner

## 2016-03-30 ENCOUNTER — Encounter: Payer: Self-pay | Admitting: Nurse Practitioner

## 2016-03-30 VITALS — BP 110/72 | HR 88 | Temp 97.2°F | Ht 66.0 in | Wt 192.0 lb

## 2016-03-30 DIAGNOSIS — M79605 Pain in left leg: Secondary | ICD-10-CM

## 2016-03-30 MED ORDER — NAPROXEN 500 MG PO TABS: 500.0000 mg | ORAL_TABLET | Freq: Two times a day (BID) | ORAL | 1 refills | Status: DC

## 2016-03-30 NOTE — Patient Instructions (Signed)
Deep Vein Thrombosis A deep vein thrombosis (DVT) is a blood clot (thrombus) that usually occurs in a deep, larger vein of the lower leg or the pelvis, or in an upper extremity such as the arm. These are dangerous and can lead to serious and even life-threatening complications if the clot travels to the lungs. A DVT can damage the valves in your leg veins so that instead of flowing upward, the blood pools in the lower leg. This is called post-thrombotic syndrome, and it can result in pain, swelling, discoloration, and sores on the leg. What are the causes? A DVT is caused by the formation of a blood clot in your leg, pelvis, or arm. Usually, several things contribute to the formation of blood clots. A clot may develop when:  Your blood flow slows down.  Your vein becomes damaged in some way.  You have a condition that makes your blood clot more easily.  What increases the risk? A DVT is more likely to develop in:  People who are older, especially over 60 years of age.  People who are overweight (obese).  People who sit or lie still for a long time, such as during long-distance travel (over 4 hours), bed rest, hospitalization, or during recovery from certain medical conditions like a stroke.  People who do not engage in much physical activity (sedentary lifestyle).  People who have chronic breathing disorders.  People who have a personal or family history of blood clots or blood clotting disease.  People who have peripheral vascular disease (PVD), diabetes, or some types of cancer.  People who have heart disease, especially if the person had a recent heart attack or has congestive heart failure.  People who have neurological diseases that affect the legs (leg paresis).  People who have had a traumatic injury, such as breaking a hip or leg.  People who have recently had major or lengthy surgery, especially on the hip, knee, or abdomen.  People who have had a central line placed  inside a large vein.  People who take medicines that contain the hormone estrogen. These include birth control pills and hormone replacement therapy.  Pregnancy or during childbirth or the postpartum period.  Long plane flights (over 8 hours).  What are the signs or symptoms?  Symptoms of a DVT can include:  Swelling of your leg or arm, especially if one side is much worse.  Warmth and redness of your leg or arm, especially if one side is much worse.  Pain in your arm or leg. If the clot is in your leg, symptoms may be more noticeable or worse when you stand or walk.  A feeling of pins and needles, if the clot is in the arm.  The symptoms of a DVT that has traveled to the lungs (pulmonary embolism, PE) usually start suddenly and include:  Shortness of breath while active or at rest.  Coughing or coughing up blood or blood-tinged mucus.  Chest pain that is often worse with deep breaths.  Rapid or irregular heartbeat.  Feeling light-headed or dizzy.  Fainting.  Feeling anxious.  Sweating.  There may also be pain and swelling in a leg if that is where the blood clot started. These symptoms may represent a serious problem that is an emergency. Do not wait to see if the symptoms will go away. Get medical help right away. Call your local emergency services (911 in the [place] Do not drive yourself to the hospital. How is this diagnosed? Your health   care provider will take a medical history and perform a physical exam. You may also have other tests, including:  Blood tests to assess the clotting properties of your blood.  Imaging tests, such as CT, ultrasound, MRI, X-ray, and other tests to see if you have clots anywhere in your body.  How is this treated? After a DVT is identified, it can be treated. The type of treatment that you receive depends on many factors, such as the cause of your DVT, your risk for bleeding or developing more clots, and other medical conditions that  you have. Sometimes, a combination of treatments is necessary. Treatment options may be combined and include:  Monitoring the blood clot with ultrasound.  Taking medicines by mouth, such as newer blood thinners (anticoagulants), thrombolytics, or warfarin.  Taking anticoagulant medicine by injection or through an IV tube.  Wearing compression stockings or using different types ofdevices.  Surgery (rare) to remove the blood clot or to place a filter in your abdomen to stop the blood clot from traveling to your lungs.  Treatments for a DVT are often divided into immediate treatment and long-term treatment (up to 3 months after DVT). You can work with your health care provider to choose the treatment program that is best for you. Follow these instructions at home: If you are taking a newer oral anticoagulant:  Take the medicine every single day at the same time each day.  Understand what foods and drugs interact with this medicine.  Understand that there are no regular blood tests required when using this medicine.  Understand the side effects of this medicine, including excessive bruising or bleeding. Ask your health care provider or pharmacist about other possible side effects. If you are taking warfarin:  Understand how to take warfarin and know which foods can affect how warfarin works in your body.  Understand that it is dangerous to take too much or too little warfarin. Too much warfarin increases the risk of bleeding. Too little warfarin continues to allow the risk for blood clots.  Follow your PT and INR blood testing schedule. The PT and INR results allow your health care provider to adjust your dose of warfarin. It is very important that you have your PT and INR tested as often as told by your health care provider.  Avoid major changes in your diet, or tell your health care provider before you change your diet. Arrange a visit with a registered dietitian to answer your  questions. Many foods, especially foods that are high in vitamin K, can interfere with warfarin and affect the PT and INR results. Eat a consistent amount of foods that are high in vitamin K, such as: ? Spinach, kale, broccoli, cabbage, collard greens, turnip greens, Brussels sprouts, peas, cauliflower, seaweed, and parsley. ? Beef liver and pork liver. ? Green tea. ? Soybean oil.  Tell your health care provider about any and all medicines, vitamins, and supplements that you take, including aspirin and other over-the-counter anti-inflammatory medicines. Be especially cautious with aspirin and anti-inflammatory medicines. Do not take those before you ask your health care provider if it is safe to do so. This is important because many medicines can interfere with warfarin and affect the PT and INR results.  Do not start or stop taking any over-the-counter or prescription medicine unless your health care provider or pharmacist tells you to do so. If you take warfarin, you will also need to do these things:  Hold pressure over cuts for longer than   usual.  Tell your dentist and other health care providers that you are taking warfarin before you have any procedures in which bleeding may occur.  Avoid alcohol or drink very small amounts. Tell your health care provider if you change your alcohol intake.  Do not use tobacco products, including cigarettes, chewing tobacco, and e-cigarettes. If you need help quitting, ask your health care provider.  Avoid contact sports.  General instructions  Take over-the-counter and prescription medicines only as told by your health care provider. Anticoagulant medicines can have side effects, including easy bruising and difficulty stopping bleeding. If you are prescribed an anticoagulant, you will also need to do these things: ? Hold pressure over cuts for longer than usual. ? Tell your dentist and other health care providers that you are taking anticoagulants  before you have any procedures in which bleeding may occur. ? Avoid contact sports.  Wear a medical alert bracelet or carry a medical alert card that says you have had a PE.  Ask your health care provider how soon you can go back to your normal activities. Stay active to prevent new blood clots from forming.  Make sure to exercise while traveling or when you have been sitting or standing for a long period of time. It is very important to exercise. Exercise your legs by walking or by tightening and relaxing your leg muscles often. Take frequent walks.  Wear compression stockings as told by your health care provider to help prevent more blood clots from forming.  Do not use tobacco products, including cigarettes, chewing tobacco, and e-cigarettes. If you need help quitting, ask your health care provider.  Keep all follow-up appointments with your health care provider. This is important. How is this prevented? Take these actions to decrease your risk of developing another DVT:  Exercise regularly. For at least 30 minutes every day, engage in: ? Activity that involves moving your arms and legs. ? Activity that encourages good blood flow through your body by increasing your heart rate.  Exercise your arms and legs every hour during long-distance travel (over 4 hours). Drink plenty of water and avoid drinking alcohol while traveling.  Avoid sitting or lying in bed for long periods of time without moving your legs.  Maintain a weight that is appropriate for your height. Ask your health care provider what weight is healthy for you.  If you are a woman who is over 35 years of age, avoid unnecessary use of medicines that contain estrogen. These include birth control pills.  Do not smoke, especially if you take estrogen medicines. If you need help quitting, ask your health care provider.  If you are hospitalized, prevention measures may include:  Early walking after surgery, as soon as your  health care provider says that it is safe.  Receiving anticoagulants to prevent blood clots.If you cannot take anticoagulants, other options may be available, such as wearing compression stockings or using different types of devices.  Get help right away if:  You have new or increased pain, swelling, or redness in an arm or leg.  You have numbness or tingling in an arm or leg.  You have shortness of breath while active or at rest.  You have chest pain.  You have a rapid or irregular heartbeat.  You feel light-headed or dizzy.  You cough up blood.  You notice blood in your vomit, bowel movement, or urine. These symptoms may represent a serious problem that is an emergency. Do not wait to see   if the symptoms will go away. Get medical help right away. Call your local emergency services (911 in the [place] Do not drive yourself to the hospital. This information is not intended to replace advice given to you by your health care provider. Make sure you discuss any questions you have with your health care provider. Document Released: 01/25/2005 Document Revised: 07/03/2015 Document Reviewed: 05/22/2014 Elsevier Interactive Patient Education  2017 [employer] Inc[employer]  

## 2016-03-30 NOTE — Progress Notes (Signed)
   Subjective:    Patient ID: Justin [name], male    DOB: [birth year], 61 y.o.   MRN: [id number]  HPI Patient comes in today c/o a tight pain in left lateral lower leg just above ankle area- no erythema or edema. Walking or standing would increase pain. He said he did some stretching exercises last night and that seemed to have helped him some. Rates pain currently 3/10.    Review of Systems  Constitutional: Negative.   HENT: Negative.   Respiratory: Negative.   Cardiovascular: Negative.   Gastrointestinal: Negative.   Genitourinary: Negative.   Musculoskeletal: Positive for myalgias.  Neurological: Negative.   Psychiatric/Behavioral: Negative.   All other systems reviewed and are negative.      Objective:   Physical Exam  Constitutional: He is oriented to person, place, and time. He appears well-developed and well-nourished. No distress.  Cardiovascular: Normal rate, regular rhythm and intact distal pulses.   Pulmonary/Chest: Effort normal.  Musculoskeletal:  FROM of left ankle without pain No edema or erythema of left lower lateral leg (-) homan sign No calf pain or tightness  Neurological: He is alert and oriented to person, place, and time.  Skin: Skin is warm.  Psychiatric: He has a normal mood and affect. His behavior is normal. Judgment and thought content normal.   BP 110/72   Pulse 88   Temp 97.2 F (36.2 C) (Oral)   Ht 5\' 6"  (1.676 m)   Wt 192 lb (87.1 kg)   BMI 30.99 kg/m       Assessment & Plan:  1. Pain of left lower extremity Moist heat Continue stretching exercises If worsen RTO - naproxen (NAPROSYN) 500 MG tablet; Take 1 tablet (500 mg total) by mouth 2 (two) times daily with a meal.  Dispense: 60 tablet; Refill: 1  Mary-Margaret Daphine DeutscherMartin, FNP

## 2016-05-25 ENCOUNTER — Other Ambulatory Visit: Payer: Self-pay | Admitting: Nurse Practitioner

## 2016-05-25 DIAGNOSIS — M79605 Pain in left leg: Secondary | ICD-10-CM

## 2016-05-25 NOTE — Telephone Encounter (Signed)
last seen 03/30/16 for pain - [place] - address [place]

## 2016-06-15 ENCOUNTER — Other Ambulatory Visit: Payer: Self-pay | Admitting: Cardiovascular Disease

## 2016-06-15 DIAGNOSIS — I10 Essential (primary) hypertension: Secondary | ICD-10-CM

## 2016-06-15 MED ORDER — LISINOPRIL 20 MG PO TABS: 20.0000 mg | ORAL_TABLET | Freq: Every day | ORAL | 3 refills | Status: DC

## 2016-06-15 NOTE — Telephone Encounter (Signed)
lisinopril (PRINIVIL,ZESTRIL) 20 MG tablet   [facility] [place]

## 2016-06-21 ENCOUNTER — Encounter: Payer: Self-pay | Admitting: Family Medicine

## 2016-06-21 ENCOUNTER — Ambulatory Visit (INDEPENDENT_AMBULATORY_CARE_PROVIDER_SITE_OTHER): Payer: Commercial Managed Care - HMO | Admitting: Family Medicine

## 2016-06-21 VITALS — BP 118/94 | HR 97 | Temp 97.1°F | Ht 66.0 in | Wt 189.4 lb

## 2016-06-21 DIAGNOSIS — I10 Essential (primary) hypertension: Secondary | ICD-10-CM | POA: Diagnosis not present

## 2016-06-21 DIAGNOSIS — Z0000 Encounter for general adult medical examination without abnormal findings: Secondary | ICD-10-CM | POA: Diagnosis not present

## 2016-06-21 DIAGNOSIS — N529 Male erectile dysfunction, unspecified: Secondary | ICD-10-CM

## 2016-06-21 MED ORDER — SILDENAFIL CITRATE 100 MG PO TABS: 50.0000 mg | ORAL_TABLET | Freq: Every day | ORAL | 5 refills | Status: DC | PRN

## 2016-06-21 MED ORDER — LISINOPRIL 20 MG PO TABS: 20.0000 mg | ORAL_TABLET | Freq: Every day | ORAL | 3 refills | Status: DC

## 2016-06-21 MED ORDER — SILDENAFIL CITRATE 100 MG PO TABS: 50.0000 mg | ORAL_TABLET | Freq: Every day | ORAL | 3 refills | Status: DC | PRN

## 2016-06-21 NOTE — Patient Instructions (Signed)
Great to meet you!  Come back in 6 month sunless you need us sooner.   Please call and let me know if the viagra is too expensive.    Health Maintenance, Male A healthy lifestyle and preventive care is important for your health and wellness. Ask your health care provider about what schedule of regular examinations is right for you. What should I know about weight and diet?  Eat a Healthy Diet  Eat plenty of vegetables, fruits, whole grains, low-fat dairy products, and lean protein.  Do not eat a lot of foods high in solid fats, added sugars, or salt. Maintain a Healthy Weight  Regular exercise can help you achieve or maintain a healthy weight. You should:  Do at least 150 minutes of exercise each week. The exercise should increase your heart rate and make you sweat (moderate-intensity exercise).  Do strength-training exercises at least twice a week. Watch Your Levels of Cholesterol and Blood Lipids  Have your blood tested for lipids and cholesterol every 5 years starting at 59 years of age. If you are at high risk for heart disease, you should start having your blood tested when you are 59 years old. You may need to have your cholesterol levels checked more often if:  Your lipid or cholesterol levels are high.  You are older than 59 years of age.  You are at high risk for heart disease. What should I know about cancer screening? Many types of cancers can be detected early and may often be prevented. Lung Cancer  You should be screened every year for lung cancer if:  You are a current smoker who has smoked for at least 30 years.  You are a former smoker who has quit within the past 15 years.  Talk to your health care provider about your screening options, when you should start screening, and how often you should be screened. Colorectal Cancer  Routine colorectal cancer screening usually begins at 59 years of age and should be repeated every 5-10 years until you are 59 years  old. You may need to be screened more often if early forms of precancerous polyps or small growths are found. Your health care provider may recommend screening at an earlier age if you have risk factors for colon cancer.  Your health care provider may recommend using home test kits to check for hidden blood in the stool.  A small camera at the end of a tube can be used to examine your colon (sigmoidoscopy or colonoscopy). This checks for the earliest forms of colorectal cancer. Prostate and Testicular Cancer  Depending on your age and overall health, your health care provider may do certain tests to screen for prostate and testicular cancer.  Talk to your health care provider about any symptoms or concerns you have about testicular or prostate cancer. Skin Cancer  Check your skin from head to toe regularly.  Tell your health care provider about any new moles or changes in moles, especially if:  There is a change in a mole's size, shape, or color.  You have a mole that is larger than a pencil eraser.  Always use sunscreen. Apply sunscreen liberally and repeat throughout the day.  Protect yourself by wearing long sleeves, pants, a wide-brimmed hat, and sunglasses when outside. What should I know about heart disease, diabetes, and high blood pressure?  If you are 1518-59 years of age, have your blood pressure checked every 3-5 years. If you are 59 years of age or  older, have your blood pressure checked every year. You should have your blood pressure measured twice-once when you are at a hospital or clinic, and once when you are not at a hospital or clinic. Record the average of the two measurements. To check your blood pressure when you are not at a hospital or clinic, you can use:  An automated blood pressure machine at a pharmacy.  A home blood pressure monitor.  Talk to your health care provider about your target blood pressure.  If you are between 38-83 years old, ask your health care  provider if you should take aspirin to prevent heart disease.  Have regular diabetes screenings by checking your fasting blood sugar level.  If you are at a normal weight and have a low risk for diabetes, have this test once every three years after the age of 77.  If you are overweight and have a high risk for diabetes, consider being tested at a younger age or more often.  A one-time screening for abdominal aortic aneurysm (AAA) by ultrasound is recommended for men aged 50-75 years who are current or former smokers. What should I know about preventing infection? Hepatitis B  If you have a higher risk for hepatitis B, you should be screened for this virus. Talk with your health care provider to find out if you are at risk for hepatitis B infection. Hepatitis C  Blood testing is recommended for:  Everyone born from [birth year] through 1963.  Anyone with known risk factors for hepatitis C. Sexually Transmitted Diseases (STDs)  You should be screened each year for STDs including gonorrhea and chlamydia if:  You are sexually active and are younger than 59 years of age.  You are older than 59 years of age and your health care provider tells you that you are at risk for this type of infection.  Your sexual activity has changed since you were last screened and you are at an increased risk for chlamydia or gonorrhea. Ask your health care provider if you are at risk.  Talk with your health care provider about whether you are at high risk of being infected with HIV. Your health care provider may recommend a prescription medicine to help prevent HIV infection. What else can I do?  Schedule regular health, dental, and eye exams.  Stay current with your vaccines (immunizations).  Do not use any tobacco products, such as cigarettes, chewing tobacco, and e-cigarettes. If you need help quitting, ask your health care provider.  Limit alcohol intake to no more than 2 drinks per day. One drink equals 12  ounces of beer, 5 ounces of wine, or 1 ounces of hard liquor.  Do not use street drugs.  Do not share needles.  Ask your health care provider for help if you need support or information about quitting drugs.  Tell your health care provider if you often feel depressed.  Tell your health care provider if you have ever been abused or do not feel safe at home. This information is not intended to replace advice given to you by your health care provider. Make sure you discuss any questions you have with your health care provider. Document Released: 07/24/2007 Document Revised: 09/24/2015 Document Reviewed: 10/29/2014 Elsevier Interactive Patient Education  2017 [employer].

## 2016-06-21 NOTE — Progress Notes (Signed)
   HPI  Patient presents today here for an annual physical exam.  Patient feels well and has no complaints except for erectile dysfunction.  Patient states that in general he has some difficulty getting and maintaining erections, however she takes almost any medication including gabapentin or Flexeril which she has recently taken for sciatica he has difficulty with erections.  He has history of CAD, asymptomatic, and history of bicuspid aortic valve status post mechanical valve replacement. He denies chest pain, shortness of breath. He's walking on average 4 miles daily and walks at least a mile and a half every day.  He does not take nitrates.  PMH: Smoking status noted ROS: Per HPI  Objective: BP (!) 118/94   Pulse 97   Temp 97.1 F (36.2 C) (Oral)   Ht '5\' 6"'$  (1.676 m)   Wt 189 lb 6.4 oz (85.9 kg)   BMI 30.57 kg/m  Gen: NAD, alert, cooperative with exam HEENT: NCAT, EOMI, PERRL, oropharynx clear, nares clear, TMs normal bilaterally CV: RRR, good S1/S2, no murmur Resp: CTABL, no wheezes, non-labored Abd: SNTND, BS present, no guarding or organomegaly Ext: No edema, warm Neuro: Alert and oriented, strength 5/5 and sensation intact in bilateral lower extremities, 2+ patellar tendon reflexes bilaterally  Assessment and plan:  # Annual physical exam Normal exam, discussed obesity, discussed therapeutic lifestyle changes. Patient is active but is not watching his diet. Basic labs ordered, fasting Titus C antibody included, PSA included  # Hypertension Elevated diastolic today Restart lisinopril, refilled Labs  # Erectile dysfunction Trial of Viagra 50 mg, if not covered by insurance will try Revatio generic.     Orders Placed This Encounter  Procedures  . CBC with Differential/Platelet  . CMP14+EGFR  . Lipid panel  . PSA  . Hepatitis C antibody    Meds ordered this encounter  Medications  . DISCONTD: sildenafil (VIAGRA) 100 MG tablet    Sig: Take 0.5-1  tablets (50-100 mg total) by mouth daily as needed for erectile dysfunction.    Dispense:  90 tablet    Refill:  3  . lisinopril (PRINIVIL,ZESTRIL) 20 MG tablet    Sig: Take 1 tablet (20 mg total) by mouth daily.    Dispense:  90 tablet    Refill:  3  . sildenafil (VIAGRA) 100 MG tablet    Sig: Take 0.5-1 tablets (50-100 mg total) by mouth daily as needed for erectile dysfunction.    Dispense:  10 tablet    Refill:  [place], MD [place] 06/21/2016, 4:57 PM

## 2016-06-22 LAB — CMP14+EGFR
A/G RATIO: 1.7 (ref 1.2–2.2)
ALT: 33 IU/L (ref 0–44)
AST: 25 IU/L (ref 0–40)
Albumin: 4.4 g/dL (ref 3.5–5.5)
Alkaline Phosphatase: 33 IU/L — ABNORMAL LOW (ref 39–117)
BUN/Creatinine Ratio: 14 (ref 9–20)
BUN: 15 mg/dL (ref 6–24)
Bilirubin Total: 0.7 mg/dL (ref 0.0–1.2)
CALCIUM: 9.6 mg/dL (ref 8.7–10.2)
CO2: 23 mmol/L (ref 18–29)
Chloride: 101 mmol/L (ref 96–106)
Creatinine, Ser: 1.04 mg/dL (ref 0.76–1.27)
GFR calc Af Amer: 91 mL/min/{1.73_m2} (ref >59)
GFR, EST NON AFRICAN AMERICAN: 79 mL/min/{1.73_m2} (ref >59)
GLUCOSE: 108 mg/dL — AB (ref 65–99)
Globulin, Total: 2.6 g/dL (ref 1.5–4.5)
POTASSIUM: 3.9 mmol/L (ref 3.5–5.2)
Sodium: 140 mmol/L (ref 134–144)
Total Protein: 7 g/dL (ref 6.0–8.5)

## 2016-06-22 LAB — CBC WITH DIFFERENTIAL/PLATELET
BASOS ABS: 0.1 10*3/uL (ref 0.0–0.2)
Basos: 1 %
EOS (ABSOLUTE): 0.4 10*3/uL (ref 0.0–0.4)
Eos: 8 %
Hematocrit: 49.2 % (ref 37.5–51.0)
Hemoglobin: 17 g/dL (ref 13.0–17.7)
IMMATURE GRANULOCYTES: 0 %
Immature Grans (Abs): 0 10*3/uL (ref 0.0–0.1)
LYMPHS: 36 %
Lymphocytes Absolute: 1.7 10*3/uL (ref 0.7–3.1)
MCH: 31.7 pg (ref 26.6–33.0)
MCHC: 34.6 g/dL (ref 31.5–35.7)
MCV: 92 fL (ref 79–97)
MONOS ABS: 0.4 10*3/uL (ref 0.1–0.9)
Monocytes: 9 %
NEUTROS PCT: 46 %
Neutrophils Absolute: 2.2 10*3/uL (ref 1.4–7.0)
PLATELETS: 203 10*3/uL (ref 150–379)
RBC: 5.36 x10E6/uL (ref 4.14–5.80)
RDW: 14.4 % (ref 12.3–15.4)
WBC: 4.8 10*3/uL (ref 3.4–10.8)

## 2016-06-22 LAB — LIPID PANEL
CHOL/HDL RATIO: 3.3 ratio (ref 0.0–5.0)
Cholesterol, Total: 182 mg/dL (ref 100–199)
HDL: 56 mg/dL (ref >39)
LDL Calculated: 107 mg/dL — ABNORMAL HIGH (ref 0–99)
TRIGLYCERIDES: 97 mg/dL (ref 0–149)
VLDL Cholesterol Cal: 19 mg/dL (ref 5–40)

## 2016-06-22 LAB — PSA: Prostate Specific Ag, Serum: 1.5 ng/mL (ref 0.0–4.0)

## 2016-06-22 LAB — HEPATITIS C ANTIBODY: Hep C Virus Ab: <0.1 s/co ratio (ref 0.0–0.9)

## 2016-06-24 LAB — SPECIMEN STATUS REPORT

## 2016-06-24 LAB — HGB A1C W/O EAG: Hgb A1c MFr Bld: 5.2 % (ref 4.8–5.6)

## 2016-08-23 ENCOUNTER — Other Ambulatory Visit: Payer: Self-pay | Admitting: Cardiovascular Disease

## 2016-12-13 ENCOUNTER — Other Ambulatory Visit: Payer: Self-pay

## 2016-12-13 MED ORDER — CHLORTHALIDONE 25 MG PO TABS: 25.0000 mg | ORAL_TABLET | Freq: Every day | ORAL | 0 refills | Status: DC

## 2017-01-04 ENCOUNTER — Ambulatory Visit: Payer: 59 | Admitting: Cardiovascular Disease

## 2017-01-04 ENCOUNTER — Encounter: Payer: Self-pay | Admitting: Cardiovascular Disease

## 2017-01-04 VITALS — BP 118/88 | HR 64 | Ht 66.0 in | Wt 190.0 lb

## 2017-01-04 DIAGNOSIS — R079 Chest pain, unspecified: Secondary | ICD-10-CM

## 2017-01-04 DIAGNOSIS — I25118 Atherosclerotic heart disease of native coronary artery with other forms of angina pectoris: Secondary | ICD-10-CM | POA: Diagnosis not present

## 2017-01-04 DIAGNOSIS — I10 Essential (primary) hypertension: Secondary | ICD-10-CM | POA: Diagnosis not present

## 2017-01-04 DIAGNOSIS — Z952 Presence of prosthetic heart valve: Secondary | ICD-10-CM

## 2017-01-04 NOTE — Patient Instructions (Signed)

## 2017-01-04 NOTE — Progress Notes (Signed)
SUBJECTIVE: The patient presents for routine follow up. He has a past medical history significant for aortic valve replacement (bioprosthetic tissue valve on 04/16/2011) for a bicuspid aortic valve with consequent severe aortic insufficiency. He also has a history of hypertension. Cardiac catheterization yielded 50-60% mid RCA disease with otherwise normal coronaries; EF 65%.  He has been doing well overall.  He walks anywhere between a mile and a quarter and a mile and a daily.  He does so when it is really cold outside or when it is really hot.  He denies exertional chest pain and exertional dyspnea.  He also denies palpitations, orthopnea, and leg swelling.  He has occasional retrosternal chest pain which may last seconds and on one occasion lasted all day.  It was alleviated with ibuprofen.  It occurred at rest.  ECG performed today which I personally interpreted demonstrated sinus rhythm with PVCs and old inferior infarct pattern.       Review of Systems: As per "subjective", otherwise negative.  No Known Allergies  Current Outpatient Medications  Medication Sig Dispense Refill  . acetaminophen (TYLENOL) 500 MG tablet Take 1,000 mg by mouth every 6 (six) hours as needed for pain.     Marland Kitchen. aspirin 81 MG tablet Take 81 mg by mouth daily.    . chlorthalidone (HYGROTON) 25 MG tablet Take 1 tablet (25 mg total) daily by mouth. 30 tablet 0  . Cholecalciferol (VITAMIN D3) 2000 UNITS TABS Take 1 tablet by mouth daily.      Marland Kitchen. ibuprofen (ADVIL,MOTRIN) 200 MG tablet Take 400 mg by mouth every 6 (six) hours as needed for pain.    Marland Kitchen. lisinopril (PRINIVIL,ZESTRIL) 20 MG tablet Take 1 tablet (20 mg total) by mouth daily. 90 tablet 3  . Multiple Vitamins-Minerals (CENTRUM SILVER PO) Take 1 tablet by mouth daily.      . Omega-3 Fatty Acids (FISH OIL CONCENTRATE PO) Take 1 tablet by mouth daily.      . Probiotic Product (PROBIOTIC DAILY PO) Take 1 tablet by mouth daily.    . sildenafil (VIAGRA)  100 MG tablet Take 0.5-1 tablets (50-100 mg total) by mouth daily as needed for erectile dysfunction. 10 tablet 5   No current facility-administered medications for this visit.     Past Medical History:  Diagnosis Date  . Aortic stenosis    Bicuspid Ao Valve (TEE 2003, TTE 2007),moderate aortic insufficiency  . HTN (hypertension)   . S/P aortic valve replacement 04/16/2011   Secondary to severe aortic insufficiency with cusp prolapse.    Past Surgical History:  Procedure Laterality Date  . ANAL FISSURE REPAIR    . AORTIC VALVE REPLACEMENT  04/16/2011   Procedure: AORTIC VALVE REPLACEMENT (AVR);  Surgeon: Purcell Nailslarence H Owen, MD;  [facility];  Service: Open Heart Surgery;  Laterality: N/A;  aortic valve replacement  . Left arm surgery    . VASECTOMY      Social History   Socioeconomic History  . Marital status: Married    Spouse name: Not on file  . Number of children: 2  . Years of education: Not on file  . Highest education level: Not on file  Social Needs  . Financial resource strain: Not on file  . Food insecurity - worry: Not on file  . Food insecurity - inability: Not on file  . Transportation needs - medical: Not on file  . Transportation needs - non-medical: Not on file  Occupational History  . 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 Smoking status: Never Smoker  . Smokeless tobacco: Never Used  Substance and Sexual Activity  . Alcohol use: Yes    Comment: Occasionally--socially  . Drug use: No  . Sexual activity: Not on file  Other Topics Concern  . Not on file  Social History Narrative  . Not on file     Vitals:   01/04/17 1606  BP: 118/88  Pulse: 64  SpO2: 98%  Weight: 190 lb (86.2 kg)  Height: 5\' 6"  (1.676 m)    Wt Readings from Last 3 Encounters:  01/04/17 190 lb (86.2 kg)  06/21/16 189 lb 6.4 oz (85.9 kg)  03/30/16 192 lb (87.1 kg)     PHYSICAL EXAM General: NAD HEENT: Normal. Neck: No JVD, no thyromegaly. Lungs: Clear to auscultation  bilaterally with normal respiratory effort. CV: Regular rate and rhythm, regular S1/prosthetic S2, no S3/S4, Soft I/VI systolic murmur at RUSB. No pretibial or periankle edema.  No carotid bruit.   Abdomen: Soft, nontender, no distention.  Neurologic: Alert and oriented.  Psych: Normal affect. Skin: Normal. Musculoskeletal: No gross deformities.    ECG: Most recent ECG reviewed.   Labs: Lab Results  Component Value Date/Time   K 3.9 06/21/2016 04:55 PM   BUN 15 06/21/2016 04:55 PM   CREATININE 1.04 06/21/2016 04:55 PM   CREATININE 1.13 07/21/2012 09:25 AM   ALT 33 06/21/2016 04:55 PM   HGB 17.0 06/21/2016 04:55 PM     Lipids: Lab Results  Component Value Date/Time   LDLCALC 107 (H) 06/21/2016 04:55 PM   CHOL 182 06/21/2016 04:55 PM   TRIG 97 06/21/2016 04:55 PM   HDL 56 06/21/2016 04:55 PM       ASSESSMENT AND PLAN: 1. CAD: Stable ischemic heart disease. No exertional symptoms with respect to his known RCA lesion.  He has good exercise tolerance without exertional anginal symptoms. Continue ASA 81 mg daily.   2. Essential HTN: Mildly elevated DBP. No changes to therapy.  3. S/p bioprosthetic AVR: Stable, with no diastolic murmur appreciated.   4.  Chest pain: It is nonexertional and he has good exercise tolerance.  This is not likely to be cardiac in etiology.  Should he develop exertional symptoms such as exertional chest pain and/or dyspnea, I would obtain a stress test.    Disposition: Follow up 1 year   Prentice DockerSuresh Koneswaran, M.D., F.A.C.C.

## 2017-09-05 ENCOUNTER — Other Ambulatory Visit: Payer: Self-pay | Admitting: Cardiovascular Disease

## 2017-09-05 DIAGNOSIS — I10 Essential (primary) hypertension: Secondary | ICD-10-CM

## 2017-12-28 ENCOUNTER — Other Ambulatory Visit: Payer: Self-pay | Admitting: Cardiovascular Disease

## 2017-12-28 DIAGNOSIS — I10 Essential (primary) hypertension: Secondary | ICD-10-CM

## 2017-12-28 MED ORDER — CHLORTHALIDONE 25 MG PO TABS: 25.0000 mg | ORAL_TABLET | Freq: Every day | ORAL | 0 refills | Status: DC

## 2017-12-28 MED ORDER — LISINOPRIL 20 MG PO TABS: 20.0000 mg | ORAL_TABLET | Freq: Every day | ORAL | 0 refills | Status: DC

## 2017-12-28 NOTE — Telephone Encounter (Signed)
° °  1. Which medications need to be refilled? (please list name of each medication and dose if known) Hygroton 25mg  and Lisinopril 20 mg   2. Which pharmacy/location (including street and city if local pharmacy) is medication to be sent to?  [facility]   3. Do they need a 30 day or 90 day supply?

## 2018-01-09 ENCOUNTER — Ambulatory Visit: Payer: Self-pay | Admitting: Cardiovascular Disease

## 2018-03-06 ENCOUNTER — Other Ambulatory Visit: Payer: Self-pay | Admitting: Cardiovascular Disease

## 2018-03-06 DIAGNOSIS — I10 Essential (primary) hypertension: Secondary | ICD-10-CM

## 2018-03-27 ENCOUNTER — Ambulatory Visit: Payer: Self-pay | Admitting: Cardiovascular Disease

## 2018-04-10 ENCOUNTER — Ambulatory Visit: Payer: 59 | Admitting: Cardiovascular Disease

## 2018-04-10 ENCOUNTER — Encounter: Payer: Self-pay | Admitting: Cardiovascular Disease

## 2018-04-10 VITALS — BP 114/72 | HR 103 | Ht 66.0 in | Wt 194.0 lb

## 2018-04-10 DIAGNOSIS — E7800 Pure hypercholesterolemia, unspecified: Secondary | ICD-10-CM | POA: Diagnosis not present

## 2018-04-10 DIAGNOSIS — Z952 Presence of prosthetic heart valve: Secondary | ICD-10-CM

## 2018-04-10 DIAGNOSIS — I25118 Atherosclerotic heart disease of native coronary artery with other forms of angina pectoris: Secondary | ICD-10-CM | POA: Diagnosis not present

## 2018-04-10 DIAGNOSIS — R000 Tachycardia, unspecified: Secondary | ICD-10-CM

## 2018-04-10 DIAGNOSIS — I10 Essential (primary) hypertension: Secondary | ICD-10-CM

## 2018-04-10 DIAGNOSIS — D649 Anemia, unspecified: Secondary | ICD-10-CM

## 2018-04-10 MED ORDER — ATORVASTATIN CALCIUM 20 MG PO TABS: 20.0000 mg | ORAL_TABLET | Freq: Every day | ORAL | 6 refills | Status: DC

## 2018-04-10 NOTE — Patient Instructions (Signed)
Medication Instructions:   Begin Lipitor 20mg  daily.   Continue all other medications.    Labwork:  FLP, CBC, CMET - orders givent today,  Reminder:  Nothing to eat or drink after 12 midnight prior to labs.  Office will contact with results via phone or letter.    Testing/Procedures:  Your physician has requested that you have an echocardiogram. Echocardiography is a painless test that uses sound waves to create images of your heart. It provides your doctor with information about the size and shape of your heart and how well your heart's chambers and valves are working. This procedure takes approximately one hour. There are no restrictions for this procedure.  Office will contact with results via phone or letter.    Follow-Up: Your physician wants you to follow up in:  1 year.  You will receive a reminder letter in the mail one-two months in advance.  If you don't receive a letter, please call our office to schedule the follow up appointment   Any Other Special Instructions Will Be Listed Below (If Applicable).  If you need a refill on your cardiac medications before your next appointment, please call your pharmacy.

## 2018-04-10 NOTE — Progress Notes (Signed)
SUBJECTIVE:  The patient presents for routine follow up. He has a past medical history significant for aortic valve replacement (bioprosthetic tissue valve on 04/16/2011) for a bicuspid aortic valve with consequent severe aortic insufficiency. He also has a history of hypertension. Cardiac catheterization yielded 50-60% mid RCA disease with otherwise normal coronaries; EF 65%.  ECG performed in the office today which I ordered and personally interpreted demonstrates normal sinus rhythm, heart rate 90 bpm, with nonspecific ST segment and T wave abnormalities.  He denies exertional chest pain and dyspnea.  He denies palpitations, leg swelling, lightheadedness, dizziness, orthopnea, and syncope.  He does quite a bit of walking at work and climbs 6 flights of stairs repeatedly throughout the day.  He has not had any blood test since May 2018.    Review of Systems: As per "subjective", otherwise negative.  No Known Allergies  Current Outpatient Medications  Medication Sig Dispense Refill  . acetaminophen (TYLENOL) 500 MG tablet Take 1,000 mg by mouth every 6 (six) hours as needed for pain.     Marland Kitchen aspirin 81 MG tablet Take 81 mg by mouth daily.    . chlorthalidone (HYGROTON) 25 MG tablet TAKE 1 TABLET (25 MG TOTAL) BY MOUTH DAILY. 90 tablet 0  . Cholecalciferol (VITAMIN D3) 2000 UNITS TABS Take 1 tablet by mouth daily.      Marland Kitchen ibuprofen (ADVIL,MOTRIN) 200 MG tablet Take 400 mg by mouth every 6 (six) hours as needed for pain.    Marland Kitchen lisinopril (PRINIVIL,ZESTRIL) 20 MG tablet TAKE 1 TABLET BY MOUTH DAILY 90 tablet 0  . Multiple Vitamins-Minerals (CENTRUM SILVER PO) Take 1 tablet by mouth daily.      . Omega-3 Fatty Acids (FISH OIL CONCENTRATE PO) Take 1 tablet by mouth daily.      . Probiotic Product (PROBIOTIC DAILY PO) Take 1 tablet by mouth daily.    . sildenafil (VIAGRA) 100 MG tablet Take 0.5-1 tablets (50-100 mg total) by mouth daily as needed for erectile dysfunction. 10 tablet 5    No current facility-administered medications for this visit.     Past Medical History:  Diagnosis Date  . Aortic stenosis    Bicuspid Ao Valve (TEE 2003, TTE 2007),moderate aortic insufficiency  . HTN (hypertension)   . S/P aortic valve replacement 04/16/2011   Secondary to severe aortic insufficiency with cusp prolapse.    Past Surgical History:  Procedure Laterality Date  . ANAL FISSURE REPAIR    . AORTIC VALVE REPLACEMENT  04/16/2011   Procedure: AORTIC VALVE REPLACEMENT (AVR);  Surgeon: Purcell Nails, MD;  Location: [facility] OR;  Service: Open Heart Surgery;  Laterality: N/A;  aortic valve replacement  . Left arm surgery    . VASECTOMY      Social History   Socioeconomic History  . Marital status: Married    Spouse name: Not on file  . Number of children: 2  . Years of education: Not on file  . Highest education level: Not on file  Occupational History  . Occupation: Research scientist (physical sciences)  . Financial resource strain: Not on file  . Food insecurity:    Worry: Not on file    Inability: Not on file  . Transportation needs:    Medical: Not on file    Non-medical: Not on file  Tobacco Use  . Smoking status: Never Smoker  . Smokeless tobacco: Never Used  Substance and Sexual Activity  . Alcohol use: Yes    Comment: Occasionally--socially  .  Drug use: No  . Sexual activity: Not on file  Lifestyle  . Physical activity:    Days per week: Not on file    Minutes per session: Not on file  . Stress: Not on file  Relationships  . Social connections:    Talks on phone: Not on file    Gets together: Not on file    Attends religious service: Not on file    Active member of club or organization: Not on file    Attends meetings of clubs or organizations: Not on file    Relationship status: Not on file  . Intimate partner violence:    Fear of current or ex partner: Not on file    Emotionally abused: Not on file    Physically abused: Not on file    Forced sexual activity:  Not on file  Other Topics Concern  . Not on file  Social History Narrative  . Not on file     Vitals:   04/10/18 1553  BP: 114/72  Pulse: (!) 103  SpO2: 96%  Weight: 194 lb (88 kg)  Height: 5\' 6"  (1.676 m)    Wt Readings from Last 3 Encounters:  04/10/18 194 lb (88 kg)  01/04/17 190 lb (86.2 kg)  06/21/16 189 lb 6.4 oz (85.9 kg)     PHYSICAL EXAM General: NAD HEENT: Normal. Neck: No JVD, no thyromegaly. Lungs: Clear to auscultation bilaterally with normal respiratory effort. CV: Regular rate and rhythm, normal S1/S2, no S3/S4, Soft I/VI systolic murmur at RUSB. No pretibial or periankle edema.  No carotid bruit.   Abdomen: Soft, nontender, no distention.  Neurologic: Alert and oriented.  Psych: Normal affect. Skin: Normal. Musculoskeletal: No gross deformities.    ECG: Reviewed above under Subjective   Labs: Lab Results  Component Value Date/Time   K 3.9 06/21/2016 04:55 PM   BUN 15 06/21/2016 04:55 PM   CREATININE 1.04 06/21/2016 04:55 PM   CREATININE 1.13 07/21/2012 09:25 AM   ALT 33 06/21/2016 04:55 PM   HGB 17.0 06/21/2016 04:55 PM     Lipids: Lab Results  Component Value Date/Time   LDLCALC 107 (H) 06/21/2016 04:55 PM   CHOL 182 06/21/2016 04:55 PM   TRIG 97 06/21/2016 04:55 PM   HDL 56 06/21/2016 04:55 PM       ASSESSMENT AND PLAN:  1. CAD: Stable ischemic heart disease. No exertional symptoms with respect to his known RCA lesion.  He has good exercise tolerance without exertional anginal symptoms. Continue ASA 81 mg daily. I will add Lipitor 20 mg daily.  I will also check lipids and a comprehensive metabolic panel.  2. Essential HTN:BP is normal.  No changes to therapy.  3. S/p bioprosthetic AVR: Stable, with no diastolic murmur appreciated. I will order a 2-D echocardiogram with Doppler to evaluate cardiac structure, function, and regional wall motion.  4.  Anemia: I will check a CBC.  5.  Tachycardia: He has had a history of a  low-grade sinus tachycardia but it does not appear to have caused any symptoms.  He does not require beta-blocker therapy at this time.    Disposition: Follow up 1 year   Prentice Docker, M.D., F.A.C.C.

## 2018-04-12 ENCOUNTER — Other Ambulatory Visit: Payer: Self-pay | Admitting: Cardiovascular Disease

## 2018-04-12 DIAGNOSIS — I359 Nonrheumatic aortic valve disorder, unspecified: Secondary | ICD-10-CM

## 2018-04-17 LAB — LIPID PANEL
Cholesterol: 127 mg/dL (ref <200)
HDL: 49 mg/dL (ref >=40)
LDL Cholesterol (Calc): 62 mg/dL (calc)
Non-HDL Cholesterol (Calc): 78 mg/dL (calc) (ref <130)
TRIGLYCERIDES: 76 mg/dL (ref <150)
Total CHOL/HDL Ratio: 2.6 (calc) (ref <5.0)

## 2018-04-17 LAB — COMPREHENSIVE METABOLIC PANEL
AG RATIO: 1.7 (calc) (ref 1.0–2.5)
ALKALINE PHOSPHATASE (APISO): 31 U/L — AB (ref 35–144)
ALT: 19 U/L (ref 9–46)
AST: 16 U/L (ref 10–35)
Albumin: 4.1 g/dL (ref 3.6–5.1)
BUN: 15 mg/dL (ref 7–25)
CO2: 28 mmol/L (ref 20–32)
Calcium: 9.2 mg/dL (ref 8.6–10.3)
Chloride: 103 mmol/L (ref 98–110)
Creat: 1.02 mg/dL (ref 0.70–1.25)
Globulin: 2.4 g/dL (calc) (ref 1.9–3.7)
Glucose, Bld: 113 mg/dL — ABNORMAL HIGH (ref 65–99)
Potassium: 4 mmol/L (ref 3.5–5.3)
Sodium: 138 mmol/L (ref 135–146)
Total Bilirubin: 0.6 mg/dL (ref 0.2–1.2)
Total Protein: 6.5 g/dL (ref 6.1–8.1)

## 2018-04-17 LAB — CBC
HEMATOCRIT: 46.3 % (ref 38.5–50.0)
HEMOGLOBIN: 16.3 g/dL (ref 13.2–17.1)
MCH: 31.8 pg (ref 27.0–33.0)
MCHC: 35.2 g/dL (ref 32.0–36.0)
MCV: 90.3 fL (ref 80.0–100.0)
MPV: 12.5 fL (ref 7.5–12.5)
Platelets: 174 10*3/uL (ref 140–400)
RBC: 5.13 10*6/uL (ref 4.20–5.80)
RDW: 13.1 % (ref 11.0–15.0)
WBC: 3.9 10*3/uL (ref 3.8–10.8)

## 2018-04-19 ENCOUNTER — Telehealth: Payer: Self-pay | Admitting: *Deleted

## 2018-04-19 NOTE — Telephone Encounter (Signed)
Notes recorded by Fonnie Birkenhead, CMA on 04/18/2018 at 1:51 PM EDT Pt made aware, voiced understanding. ------  Notes recorded by Laqueta Linden, MD on 04/18/2018 at 12:46 PM EDT Good. ------  Notes recorded by Laqueta Linden, MD on 04/17/2018 at 3:12 PM EDT Normal CBC.

## 2018-05-01 ENCOUNTER — Telehealth: Payer: Self-pay | Admitting: Cardiovascular Disease

## 2018-05-01 NOTE — Telephone Encounter (Signed)
°  Precert needed for: ECHO   Location: [facility]   Date:May 31, 2018

## 2018-05-04 ENCOUNTER — Other Ambulatory Visit: Payer: Self-pay

## 2018-05-05 ENCOUNTER — Ambulatory Visit (INDEPENDENT_AMBULATORY_CARE_PROVIDER_SITE_OTHER): Payer: 59 | Admitting: Family Medicine

## 2018-05-05 ENCOUNTER — Other Ambulatory Visit: Payer: Self-pay

## 2018-05-05 NOTE — Progress Notes (Signed)
Attempted to call patient 3 times, he did not answer and left a message and has not called back, he may contact us in the future if needed. Arville Care, MD [facility] 05/05/2018, 4:40 PM

## 2018-05-11 ENCOUNTER — Telehealth: Payer: Self-pay

## 2018-05-11 ENCOUNTER — Encounter: Payer: Self-pay | Admitting: Family Medicine

## 2018-05-11 ENCOUNTER — Ambulatory Visit (INDEPENDENT_AMBULATORY_CARE_PROVIDER_SITE_OTHER): Payer: 59 | Admitting: Family Medicine

## 2018-05-11 ENCOUNTER — Other Ambulatory Visit: Payer: Self-pay

## 2018-05-11 DIAGNOSIS — M1712 Unilateral primary osteoarthritis, left knee: Secondary | ICD-10-CM | POA: Diagnosis not present

## 2018-05-11 MED ORDER — MELOXICAM 15 MG PO TABS: 15.0000 mg | ORAL_TABLET | Freq: Every day | ORAL | 2 refills | Status: DC

## 2018-05-11 NOTE — Progress Notes (Signed)
Virtual Visit via telephone Note  I connected with Justin [name] on 05/11/18 at 1455 by telephone and verified that I am speaking with the correct person using two identifiers. Justin [name] is currently located at home and wife are currently with her during visit. The provider, Elige Radon , MD is located in their office at time of visit.  Call ended at 1506  I discussed the limitations, risks, security and privacy concerns of performing an evaluation and management service by telephone and the availability of in person appointments. I also discussed with the patient that there may be a patient responsible charge related to this service. The patient expressed understanding and agreed to proceed.   History and Present Illness: Patient has been complaining of his knee hurting when he walks around but worse when he lays down.  Gel from chiropractor and tylenol and Advil. It is his left knee on the inside and on the front of it. He has a sharp pain and it is warm sometimes.  He does have a little swelling in the back of the knee.  No diagnosis found.  Outpatient Encounter Medications as of 05/11/2018  Medication Sig  . acetaminophen (TYLENOL) 500 MG tablet Take 1,000 mg by mouth every 6 (six) hours as needed for pain.   Marland Kitchen aspirin 81 MG tablet Take 81 mg by mouth daily.  Marland Kitchen atorvastatin (LIPITOR) 20 MG tablet Take 1 tablet (20 mg total) by mouth daily.  . chlorthalidone (HYGROTON) 25 MG tablet TAKE 1 TABLET (25 MG TOTAL) BY MOUTH DAILY.  Marland Kitchen Cholecalciferol (VITAMIN D3) 2000 UNITS TABS Take 1 tablet by mouth daily.    Marland Kitchen ibuprofen (ADVIL,MOTRIN) 200 MG tablet Take 400 mg by mouth every 6 (six) hours as needed for pain.  Marland Kitchen lisinopril (PRINIVIL,ZESTRIL) 20 MG tablet TAKE 1 TABLET BY MOUTH DAILY  . Multiple Vitamins-Minerals (CENTRUM SILVER PO) Take 1 tablet by mouth daily.    . Omega-3 Fatty Acids (FISH OIL CONCENTRATE PO) Take 1 tablet by mouth daily.    . Probiotic Product (PROBIOTIC  DAILY PO) Take 1 tablet by mouth daily.  . sildenafil (VIAGRA) 100 MG tablet Take 0.5-1 tablets (50-100 mg total) by mouth daily as needed for erectile dysfunction.  . [DISCONTINUED] Olmesartan-Amlodipine-HCTZ (TRIBENZOR) 40-5-25 MG TABS Take 1 tablet by mouth daily.   No facility-administered encounter medications on file as of 05/11/2018.     Review of Systems  Constitutional: Negative for chills and fever.  Respiratory: Negative for shortness of breath and wheezing.   Cardiovascular: Negative for chest pain and leg swelling.  Musculoskeletal: Positive for arthralgias and joint swelling. Negative for back pain and gait problem.  Skin: Negative for rash.  All other systems reviewed and are negative.   Observations/Objective: Patient sounds comfortable on the phone and in no acute distress  Assessment and Plan: Problem List Items Addressed This Visit    None    Visit Diagnoses    Primary osteoarthritis of left knee    -  Primary   Relevant Medications   meloxicam (MOBIC) 15 MG tablet       Follow Up Instructions:  Follow-up as needed, try the meloxicam, if he continues to have issues then may need to come in for steroid injection.   I discussed the assessment and treatment plan with the patient. The patient was provided an opportunity to ask questions and all were answered. The patient agreed with the plan and demonstrated an understanding of the instructions.   The  patient was advised to call back or seek an in-person evaluation if the symptoms worsen or if the condition fails to improve as anticipated.  The above assessment and management plan was discussed with the patient. The patient verbalized understanding of and has agreed to the management plan. Patient is aware to call the clinic if symptoms persist or worsen. Patient is aware when to return to the clinic for a follow-up visit. Patient educated on when it is appropriate to go to the emergency department.    I provided  11 minutes of non-face-to-face time during this encounter.    Nils Pyle, MD

## 2018-05-29 ENCOUNTER — Other Ambulatory Visit: Payer: Self-pay | Admitting: Cardiovascular Disease

## 2018-05-29 DIAGNOSIS — I10 Essential (primary) hypertension: Secondary | ICD-10-CM

## 2018-05-31 ENCOUNTER — Other Ambulatory Visit: Payer: Self-pay

## 2018-07-12 ENCOUNTER — Ambulatory Visit (INDEPENDENT_AMBULATORY_CARE_PROVIDER_SITE_OTHER): Payer: 59

## 2018-07-12 DIAGNOSIS — I359 Nonrheumatic aortic valve disorder, unspecified: Secondary | ICD-10-CM | POA: Diagnosis not present

## 2018-07-13 ENCOUNTER — Telehealth: Payer: Self-pay

## 2018-07-13 NOTE — Telephone Encounter (Signed)
Error

## 2018-08-10 ENCOUNTER — Other Ambulatory Visit: Payer: Self-pay | Admitting: Family Medicine

## 2018-11-07 ENCOUNTER — Other Ambulatory Visit: Payer: Self-pay

## 2018-11-09 ENCOUNTER — Other Ambulatory Visit: Payer: Self-pay

## 2018-11-09 ENCOUNTER — Encounter: Payer: Self-pay | Admitting: Family Medicine

## 2018-11-09 ENCOUNTER — Ambulatory Visit: Payer: 59 | Admitting: Family Medicine

## 2018-11-09 ENCOUNTER — Ambulatory Visit (INDEPENDENT_AMBULATORY_CARE_PROVIDER_SITE_OTHER): Payer: 59

## 2018-11-09 VITALS — BP 126/90 | HR 81 | Temp 98.0°F | Ht 66.0 in | Wt 191.6 lb

## 2018-11-09 DIAGNOSIS — M1712 Unilateral primary osteoarthritis, left knee: Secondary | ICD-10-CM

## 2018-11-09 DIAGNOSIS — R252 Cramp and spasm: Secondary | ICD-10-CM | POA: Diagnosis not present

## 2018-11-09 MED ORDER — METHYLPREDNISOLONE ACETATE 80 MG/ML IJ SUSP: 80.0000 mg | Freq: Once | INTRAMUSCULAR | Status: DC

## 2018-11-09 NOTE — Progress Notes (Signed)
BP 126/90   Pulse 81   Temp 98 F (36.7 C) (Temporal)   Ht '5\' 6"'$  (1.676 m)   Wt 191 lb 9.6 oz (86.9 kg)   SpO2 97%   BMI 30.93 kg/m    Subjective:   Patient ID: Justin [name], male    DOB: [birth year], 63 y.o.   MRN: [id number]  HPI: Justin [name] is a 63 y.o. male presenting on 11/09/2018 for leg cramps (bilateral x 2 months)   HPI Patient is coming in complaining of bilateral leg cramps that is been going on off and on over the past couple months, he says he has been on a diuretic and had a new blood pressure pill put in and he feels like it is been increased over the past couple months, he also was working in a heated and hot area over the past couple months as well.  Patient says sometimes awaken at night sometimes to get it during the daytime.  Is also complaining of left knee pain and arthritis that he feels like it is worsening over the past year.  The knee pain is not improved with meloxicam.  Relevant past medical, surgical, family and social history reviewed and updated as indicated. Interim medical history since our last visit reviewed. Allergies and medications reviewed and updated.  Review of Systems  Constitutional: Negative for chills and fever.  Eyes: Negative for discharge.  Respiratory: Negative for shortness of breath and wheezing.   Cardiovascular: Negative for chest pain and leg swelling.  Musculoskeletal: Positive for arthralgias and myalgias. Negative for back pain and gait problem.  Skin: Negative for rash.  All other systems reviewed and are negative.   Per HPI unless specifically indicated above   Allergies as of 11/09/2018   No Known Allergies     Medication List       Accurate as of November 09, 2018  3:27 PM. If you have any questions, ask your nurse or doctor.        acetaminophen 500 MG tablet Commonly known as: TYLENOL Take 1,000 mg by mouth every 6 (six) hours as needed for pain.   aspirin 81 MG tablet Take 81 mg by mouth daily.    atorvastatin 20 MG tablet Commonly known as: LIPITOR Take 1 tablet (20 mg total) by mouth daily.   CENTRUM SILVER PO Take 1 tablet by mouth daily.   chlorthalidone 25 MG tablet Commonly known as: HYGROTON TAKE 1 TABLET BY MOUTH EVERY DAY   FISH OIL CONCENTRATE PO Take 1 tablet by mouth daily.   lisinopril 20 MG tablet Commonly known as: ZESTRIL TAKE 1 TABLET BY MOUTH EVERY DAY   meloxicam 15 MG tablet Commonly known as: MOBIC TAKE 1 TABLET BY MOUTH EVERY DAY   PROBIOTIC DAILY PO Take 1 tablet by mouth daily.   sildenafil 100 MG tablet Commonly known as: Viagra Take 0.5-1 tablets (50-100 mg total) by mouth daily as needed for erectile dysfunction.   Vitamin D3 50 MCG (2000 UT) Tabs Take 1 tablet by mouth daily.        Objective:   BP 126/90   Pulse 81   Temp 98 F (36.7 C) (Temporal)   Ht '5\' 6"'$  (1.676 m)   Wt 191 lb 9.6 oz (86.9 kg)   SpO2 97%   BMI 30.93 kg/m   Wt Readings from Last 3 Encounters:  11/09/18 191 lb 9.6 oz (86.9 kg)  04/10/18 194 lb (88 kg)  01/04/17 190 lb (86.2 kg)  Physical Exam Vitals signs and nursing note reviewed.  Constitutional:      General: He is not in acute distress.    Appearance: He is well-developed. He is not diaphoretic.  Eyes:     General: No scleral icterus.    Conjunctiva/sclera: Conjunctivae normal.  Neck:     Musculoskeletal: Neck supple.     Thyroid: No thyromegaly.  Cardiovascular:     Rate and Rhythm: Normal rate and regular rhythm.     Heart sounds: Normal heart sounds. No murmur.  Pulmonary:     Effort: Pulmonary effort is normal. No respiratory distress.     Breath sounds: Normal breath sounds. No wheezing.  Musculoskeletal: Normal range of motion.     Left knee: He exhibits normal range of motion and no swelling. Tenderness found. Medial joint line and lateral joint line tenderness noted.  Lymphadenopathy:     Cervical: No cervical adenopathy.  Skin:    General: Skin is warm and dry.      Findings: No rash.  Neurological:     Mental Status: He is alert and oriented to person, place, and time.     Coordination: Coordination normal.  Psychiatric:        Behavior: Behavior normal.    Left knee x-ray: Medial compartmental narrowing on both knees but worse on the left than the right  Knee injection: Consent form signed. Risk factors of bleeding and infection discussed with patient and patient is agreeable towards injection. Patient prepped with Betadine. Lateral approach towards injection used. Injected 80 mg of Depo-Medrol and 1 mL of 2% lidocaine. Patient tolerated procedure well and no side effects from noted. Minimal to no bleeding. Simple bandage applied after.   Assessment & Plan:   Problem List Items Addressed This Visit    None    Visit Diagnoses    Osteoarthritis of left knee, unspecified osteoarthritis type    -  Primary   Relevant Medications   methylPREDNISolone acetate (DEPO-MEDROL) injection 80 mg (Start on 11/09/2018  4:15 PM)   Other Relevant Orders   DG Knee 1-2 Views Left   Leg cramps       Relevant Orders   BMP8+EGFR   Magnesium      Patient is on chlorthalidone and likely the cause of his leg cramps, will check magnesium and potassium and other electrolytes to make sure that there is no issues there. Follow up plan: Return if symptoms worsen or fail to improve.  Counseling provided for all of the vaccine components No orders of the defined types were placed in this encounter.   Caryl Pina, MD [facility] 11/09/2018, 3:27 PM

## 2018-11-10 LAB — BMP8+EGFR
BUN/Creatinine Ratio: 20 (ref 10–24)
BUN: 21 mg/dL (ref 8–27)
CO2: 23 mmol/L (ref 20–29)
Calcium: 9.7 mg/dL (ref 8.6–10.2)
Chloride: 103 mmol/L (ref 96–106)
Creatinine, Ser: 1.05 mg/dL (ref 0.76–1.27)
GFR calc Af Amer: 88 mL/min/{1.73_m2} (ref >59)
GFR calc non Af Amer: 76 mL/min/{1.73_m2} (ref >59)
Glucose: 87 mg/dL (ref 65–99)
Potassium: 4 mmol/L (ref 3.5–5.2)
Sodium: 139 mmol/L (ref 134–144)

## 2018-11-10 LAB — MAGNESIUM: Magnesium: 1.9 mg/dL (ref 1.6–2.3)

## 2018-11-15 ENCOUNTER — Encounter: Payer: Self-pay | Admitting: Family Medicine

## 2018-11-21 ENCOUNTER — Other Ambulatory Visit: Payer: Self-pay | Admitting: *Deleted

## 2018-11-21 DIAGNOSIS — Z20822 Contact with and (suspected) exposure to covid-19: Secondary | ICD-10-CM

## 2018-11-23 LAB — NOVEL CORONAVIRUS, NAA: SARS-CoV-2, NAA: NOT DETECTED

## 2018-11-25 ENCOUNTER — Other Ambulatory Visit: Payer: Self-pay | Admitting: Family Medicine

## 2018-11-26 ENCOUNTER — Other Ambulatory Visit: Payer: Self-pay | Admitting: Cardiovascular Disease

## 2018-11-26 DIAGNOSIS — I10 Essential (primary) hypertension: Secondary | ICD-10-CM

## 2019-02-22 ENCOUNTER — Other Ambulatory Visit: Payer: Self-pay | Admitting: Family Medicine

## 2019-03-21 ENCOUNTER — Other Ambulatory Visit: Payer: Self-pay | Admitting: Family Medicine

## 2019-03-29 ENCOUNTER — Other Ambulatory Visit: Payer: Self-pay | Admitting: Cardiovascular Disease

## 2019-03-29 DIAGNOSIS — I10 Essential (primary) hypertension: Secondary | ICD-10-CM

## 2019-05-20 ENCOUNTER — Other Ambulatory Visit: Payer: Self-pay | Admitting: Family Medicine

## 2019-06-14 ENCOUNTER — Other Ambulatory Visit: Payer: Self-pay | Admitting: Family Medicine

## 2019-07-11 ENCOUNTER — Other Ambulatory Visit: Payer: Self-pay | Admitting: Family Medicine

## 2019-07-31 ENCOUNTER — Other Ambulatory Visit: Payer: Self-pay | Admitting: Cardiovascular Disease

## 2019-07-31 DIAGNOSIS — I10 Essential (primary) hypertension: Secondary | ICD-10-CM

## 2019-08-08 ENCOUNTER — Other Ambulatory Visit: Payer: Self-pay | Admitting: Family Medicine

## 2019-08-08 NOTE — Telephone Encounter (Signed)
ntbs for refills 30 day supply was given 07/11/19

## 2019-08-08 NOTE — Telephone Encounter (Signed)
Patient's mother is in the hospital right now so he cannot come in.  He will call back in a few days to schedule an appointment.

## 2019-10-04 ENCOUNTER — Telehealth: Payer: Self-pay | Admitting: Family Medicine

## 2019-10-04 ENCOUNTER — Encounter: Payer: Self-pay | Admitting: Family Medicine

## 2019-10-04 ENCOUNTER — Other Ambulatory Visit: Payer: Self-pay

## 2019-10-04 ENCOUNTER — Ambulatory Visit (INDEPENDENT_AMBULATORY_CARE_PROVIDER_SITE_OTHER): Payer: 59 | Admitting: Family Medicine

## 2019-10-04 ENCOUNTER — Ambulatory Visit (HOSPITAL_COMMUNITY)
Admission: RE | Admit: 2019-10-04 | Discharge: 2019-10-04 | Disposition: A | Discharge status: Home / Self Care | Payer: 59 | Source: Ambulatory Visit | Attending: Family Medicine | Admitting: Family Medicine

## 2019-10-04 VITALS — BP 98/71 | HR 94 | Temp 97.3°F | Ht 66.0 in | Wt 186.0 lb

## 2019-10-04 DIAGNOSIS — I25810 Atherosclerosis of coronary artery bypass graft(s) without angina pectoris: Secondary | ICD-10-CM

## 2019-10-04 DIAGNOSIS — Z0000 Encounter for general adult medical examination without abnormal findings: Secondary | ICD-10-CM

## 2019-10-04 DIAGNOSIS — Z0001 Encounter for general adult medical examination with abnormal findings: Secondary | ICD-10-CM | POA: Diagnosis not present

## 2019-10-04 DIAGNOSIS — Z125 Encounter for screening for malignant neoplasm of prostate: Secondary | ICD-10-CM

## 2019-10-04 DIAGNOSIS — N5089 Other specified disorders of the male genital organs: Secondary | ICD-10-CM

## 2019-10-04 DIAGNOSIS — I10 Essential (primary) hypertension: Secondary | ICD-10-CM | POA: Diagnosis not present

## 2019-10-04 DIAGNOSIS — M170 Bilateral primary osteoarthritis of knee: Secondary | ICD-10-CM

## 2019-10-04 MED ORDER — SILDENAFIL CITRATE 100 MG PO TABS: 50.0000 mg | ORAL_TABLET | Freq: Every day | ORAL | 5 refills | Status: DC | PRN

## 2019-10-04 MED ORDER — MELOXICAM 15 MG PO TABS
15.0000 mg | ORAL_TABLET | Freq: Every day | ORAL | 3 refills | Status: DC
Start: 2019-10-04 — End: 2019-12-13

## 2019-10-04 NOTE — Telephone Encounter (Signed)
Placed urology referral, spoke with wife, patient was asleep, she is on HIPPA.  Will speak with patient but if can contact him then he will call back later with questions  Placed more urgent urology referral for him.

## 2019-10-04 NOTE — Progress Notes (Signed)
BP 98/71   Pulse 94   Temp (!) 97.3 F (36.3 C)   Ht 5' 6" (1.676 m)   Wt 186 lb (84.4 kg)   SpO2 96%   BMI 30.02 kg/m    Subjective:   Patient ID: Justin [name], male    DOB: Jul 19, 1955, 64 y.o.   MRN: [id number]  HPI: Justin [name] is a 64 y.o. male presenting on 10/04/2019 for Medical Management of Chronic Issues (CPE)   HPI Adult well exam Patient denies any chest pain, shortness of breath, headaches or vision issues, abdominal complaints, diarrhea, nausea, vomiting, or joint issues.   Hypertension Patient is currently on chlorthalidone and lisinopril, and their blood pressure today is 98/71. Patient denies any lightheadedness or dizziness. Patient denies headaches, blurred vision, chest pains, shortness of breath, or weakness. Denies any side effects from medication and is content with current medication.   Hyperlipidemia Patient is coming in for recheck of his hyperlipidemia. The patient is currently taking atorvastatin. They deny any issues with myalgias or history of liver damage from it. They deny any focal numbness or weakness or chest pain.   Patient has an left testicular nodule that he has noticed over the past 4 weeks, was painful at first but now there is no pain but still very firm and the nodules about the same size and is just near the bottom or the base of his left testicle, he has not noticed it prior to the past month or 2.  Relevant past medical, surgical, family and social history reviewed and updated as indicated. Interim medical history since our last visit reviewed. Allergies and medications reviewed and updated.  Review of Systems  Constitutional: Negative for chills and fever.  HENT: Negative for ear pain and tinnitus.   Eyes: Negative for pain.  Respiratory: Negative for cough, shortness of breath and wheezing.   Cardiovascular: Negative for chest pain, palpitations and leg swelling.  Gastrointestinal: Negative for abdominal pain, blood in  stool, constipation and diarrhea.  Genitourinary: Negative for dysuria and hematuria.  Musculoskeletal: Negative for back pain, gait problem and myalgias.  Skin: Negative for rash.  Neurological: Negative for dizziness, weakness, light-headedness and headaches.  Psychiatric/Behavioral: Negative for suicidal ideas.  All other systems reviewed and are negative.   Per HPI unless specifically indicated above   Allergies as of 10/04/2019   No Known Allergies     Medication List       Accurate as of October 04, 2019 10:39 AM. If you have any questions, ask your nurse or doctor.        acetaminophen 500 MG tablet Commonly known as: TYLENOL Take 1,000 mg by mouth every 6 (six) hours as needed for pain.   aspirin 81 MG tablet Take 81 mg by mouth daily.   atorvastatin 20 MG tablet Commonly known as: LIPITOR Take 1 tablet (20 mg total) by mouth daily.   CENTRUM SILVER PO Take 1 tablet by mouth daily.   chlorthalidone 25 MG tablet Commonly known as: HYGROTON TAKE 1 TABLET BY MOUTH EVERY DAY   FISH OIL CONCENTRATE PO Take 1 tablet by mouth daily.   lisinopril 20 MG tablet Commonly known as: ZESTRIL TAKE 1 TABLET BY MOUTH EVERY DAY   meloxicam 15 MG tablet Commonly known as: MOBIC Take 1 tablet (15 mg total) by mouth daily. Needs to be seen for further refills.   PROBIOTIC DAILY PO Take 1 tablet by mouth daily.   pyridOXINE 100 MG tablet Commonly known  as: VITAMIN B-6 Take 100 mg by mouth daily.   sildenafil 100 MG tablet Commonly known as: Viagra Take 0.5-1 tablets (50-100 mg total) by mouth daily as needed for erectile dysfunction.   vitamin B-12 100 MCG tablet Commonly known as: CYANOCOBALAMIN Take 100 mcg by mouth daily.   Vitamin D3 50 MCG (2000 UT) Tabs Take 1 tablet by mouth daily.        Objective:   BP 98/71   Pulse 94   Temp (!) 97.3 F (36.3 C)   Ht 5' 6" (1.676 m)   Wt 186 lb (84.4 kg)   SpO2 96%   BMI 30.02 kg/m   Wt Readings from Last  3 Encounters:  10/04/19 186 lb (84.4 kg)  11/09/18 191 lb 9.6 oz (86.9 kg)  04/10/18 194 lb (88 kg)    Physical Exam Vitals and nursing note reviewed.  Constitutional:      General: He is not in acute distress.    Appearance: He is well-developed. He is not diaphoretic.  Eyes:     General: No scleral icterus.    Conjunctiva/sclera: Conjunctivae normal.  Neck:     Thyroid: No thyromegaly.  Cardiovascular:     Rate and Rhythm: Normal rate and regular rhythm.     Heart sounds: Normal heart sounds. No murmur heard.   Pulmonary:     Effort: Pulmonary effort is normal. No respiratory distress.     Breath sounds: Normal breath sounds. No wheezing.  Genitourinary:    Penis: Normal.      Testes:        Left: Mass (Small firm nodule near the base of the left testicle, mobile, does not seem to be attached to the testicle itself, just slightly below in the scrotum) present.  Musculoskeletal:        General: Normal range of motion.     Cervical back: Neck supple.  Lymphadenopathy:     Cervical: No cervical adenopathy.  Skin:    General: Skin is warm and dry.     Findings: No rash.  Neurological:     Mental Status: He is alert and oriented to person, place, and time.     Coordination: Coordination normal.  Psychiatric:        Behavior: Behavior normal.       Assessment & Plan:   Problem List Items Addressed This Visit      Cardiovascular and Mediastinum   Hypertension   Relevant Medications   sildenafil (VIAGRA) 100 MG tablet   Other Relevant Orders   CBC with Differential/Platelet   CMP14+EGFR   CAD (coronary artery disease)   Relevant Medications   sildenafil (VIAGRA) 100 MG tablet   Other Relevant Orders   Lipid panel    Other Visit Diagnoses    Well adult exam    -  Primary   Relevant Orders   CBC with Differential/Platelet   CMP14+EGFR   Lipid panel   Prostate cancer screening       Relevant Orders   PSA, total and free   Testicular nodule       Relevant  Orders   US SCROTUM W/DOPPLER   Bilateral primary osteoarthritis of knee       Relevant Medications   meloxicam (MOBIC) 15 MG tablet    Patient has a left testicular nodule will send over for ultrasound today.  Patient has arthritis in both knees and will give meloxicam, seem to help before. Blood pressure is on the lower side but patient  is asymptomatic, no lightheadedness, will leave for now.  Follow up plan: Return in about 6 months (around 04/05/2020), or if symptoms worsen or fail to improve, for Hypertension recheck.  Counseling provided for all of the vaccine components Orders Placed This Encounter  Procedures  . US SCROTUM W/DOPPLER  . CBC with Differential/Platelet  . CMP14+EGFR  . Lipid panel  . PSA, total and free    Caryl Pina, MD [facility] 10/04/2019, 10:39 AM

## 2019-10-05 ENCOUNTER — Telehealth: Payer: Self-pay | Admitting: Family Medicine

## 2019-10-05 LAB — LIPID PANEL
Chol/HDL Ratio: 3 ratio (ref 0.0–5.0)
Cholesterol, Total: 175 mg/dL (ref 100–199)
HDL: 59 mg/dL (ref >39)
LDL Chol Calc (NIH): 102 mg/dL — ABNORMAL HIGH (ref 0–99)
Triglycerides: 73 mg/dL (ref 0–149)
VLDL Cholesterol Cal: 14 mg/dL (ref 5–40)

## 2019-10-05 LAB — CBC WITH DIFFERENTIAL/PLATELET
Basophils Absolute: 0 10*3/uL (ref 0.0–0.2)
Basos: 1 %
EOS (ABSOLUTE): 0.2 10*3/uL (ref 0.0–0.4)
Eos: 3 %
Hematocrit: 47.7 % (ref 37.5–51.0)
Hemoglobin: 16.7 g/dL (ref 13.0–17.7)
Immature Grans (Abs): 0 10*3/uL (ref 0.0–0.1)
Immature Granulocytes: 0 %
Lymphocytes Absolute: 2 10*3/uL (ref 0.7–3.1)
Lymphs: 25 %
MCH: 32.4 pg (ref 26.6–33.0)
MCHC: 35 g/dL (ref 31.5–35.7)
MCV: 92 fL (ref 79–97)
Monocytes Absolute: 0.7 10*3/uL (ref 0.1–0.9)
Monocytes: 8 %
Neutrophils Absolute: 5.1 10*3/uL (ref 1.4–7.0)
Neutrophils: 63 %
Platelets: 220 10*3/uL (ref 150–450)
RBC: 5.16 x10E6/uL (ref 4.14–5.80)
RDW: 12.9 % (ref 11.6–15.4)
WBC: 8.1 10*3/uL (ref 3.4–10.8)

## 2019-10-05 LAB — CMP14+EGFR
ALT: 37 IU/L (ref 0–44)
AST: 28 IU/L (ref 0–40)
Albumin/Globulin Ratio: 2 (ref 1.2–2.2)
Albumin: 4.6 g/dL (ref 3.8–4.8)
Alkaline Phosphatase: 42 IU/L — ABNORMAL LOW (ref 48–121)
BUN/Creatinine Ratio: 21 (ref 10–24)
BUN: 24 mg/dL (ref 8–27)
Bilirubin Total: 0.7 mg/dL (ref 0.0–1.2)
CO2: 24 mmol/L (ref 20–29)
Calcium: 9.9 mg/dL (ref 8.6–10.2)
Chloride: 101 mmol/L (ref 96–106)
Creatinine, Ser: 1.17 mg/dL (ref 0.76–1.27)
GFR calc Af Amer: 77 mL/min/{1.73_m2} (ref >59)
GFR calc non Af Amer: 66 mL/min/{1.73_m2} (ref >59)
Globulin, Total: 2.3 g/dL (ref 1.5–4.5)
Glucose: 105 mg/dL — ABNORMAL HIGH (ref 65–99)
Potassium: 4.3 mmol/L (ref 3.5–5.2)
Sodium: 139 mmol/L (ref 134–144)
Total Protein: 6.9 g/dL (ref 6.0–8.5)

## 2019-10-05 LAB — PSA, TOTAL AND FREE
PSA, Free Pct: 16.4 %
PSA, Free: 0.23 ng/mL
Prostate Specific Ag, Serum: 1.4 ng/mL (ref 0.0–4.0)

## 2019-10-05 NOTE — Telephone Encounter (Signed)
REFERRAL REQUEST Telephone Note  Have you been seen at our office for this problem? yes (Advise that they may need an appointment with their PCP before a referral can be done)  Reason for Referral: wife called in and said husband needs futher testing after having ultrasound. She was checking to see where he needed to go Referral discussed with patient: yes Best contact number of patient for referral team: [phone]   Has patient been seen by a specialist for this issue before: no Patient provider preference for referral: na Patient location preference for referral: na   Patient notified that referrals can take up to a week or longer to process. If they haven't heard anything within a week they should call back and speak with the referral department.

## 2019-10-19 ENCOUNTER — Ambulatory Visit (INDEPENDENT_AMBULATORY_CARE_PROVIDER_SITE_OTHER): Payer: 59 | Admitting: Urology

## 2019-10-19 ENCOUNTER — Encounter: Payer: Self-pay | Admitting: Urology

## 2019-10-19 ENCOUNTER — Other Ambulatory Visit: Payer: Self-pay

## 2019-10-19 VITALS — BP 111/69 | HR 62 | Temp 97.7°F | Ht 66.0 in | Wt 186.0 lb

## 2019-10-19 DIAGNOSIS — N453 Epididymo-orchitis: Secondary | ICD-10-CM

## 2019-10-19 DIAGNOSIS — N5089 Other specified disorders of the male genital organs: Secondary | ICD-10-CM

## 2019-10-19 LAB — POCT URINALYSIS DIPSTICK
Bilirubin, UA: NEGATIVE
Blood, UA: NEGATIVE
Glucose, UA: NEGATIVE
Ketones, UA: NEGATIVE
Leukocytes, UA: NEGATIVE
Nitrite, UA: NEGATIVE
Protein, UA: NEGATIVE
Spec Grav, UA: 1.015 (ref 1.010–1.025)
Urobilinogen, UA: 0.2 E.U./dL
pH, UA: 7 (ref 5.0–8.0)

## 2019-10-19 NOTE — Addendum Note (Signed)
Addended by: Ferdinand Lango on: 10/19/2019 03:06 PM   Modules accepted: Orders

## 2019-10-19 NOTE — Progress Notes (Signed)
10/19/2019 2:46 PM   Justin [name] August 05, 1955 [id number]  Referring provider: Dettinger, Elige Radon, MD [address] [place],  [place] [address]  Left testicular pain  HPI: Justin [name] is a 64yo here for evaluation of a left epididymal mass. He noted the mass 1 month ago and at that time it was painful. The pain lasted 3-4 days. It is not gross in size. It is tender to palpation.No significant LUTS. No current left testicular pain. No hx of scrotal/testicular trauma. No hematuria.   PMH: Past Medical History:  Diagnosis Date  . Aortic stenosis    Bicuspid Ao Valve (TEE 2003, TTE 2007),moderate aortic insufficiency  . HTN (hypertension)   . S/P aortic valve replacement 04/16/2011   Secondary to severe aortic insufficiency with cusp prolapse.    Surgical History: Past Surgical History:  Procedure Laterality Date  . ANAL FISSURE REPAIR    . AORTIC VALVE REPLACEMENT  04/16/2011   Procedure: AORTIC VALVE REPLACEMENT (AVR);  Surgeon: Purcell Nails, MD;  Location: [facility] OR;  Service: Open Heart Surgery;  Laterality: N/A;  aortic valve replacement  . Left arm surgery    . VASECTOMY      Home Medications:  Allergies as of 10/19/2019   No Known Allergies     Medication List       Accurate as of October 19, 2019  2:46 PM. If you have any questions, ask your nurse or doctor.        acetaminophen 500 MG tablet Commonly known as: TYLENOL Take 1,000 mg by mouth every 6 (six) hours as needed for pain.   aspirin 81 MG tablet Take 81 mg by mouth daily.   atorvastatin 20 MG tablet Commonly known as: LIPITOR Take 1 tablet (20 mg total) by mouth daily.   CENTRUM SILVER PO Take 1 tablet by mouth daily.   chlorthalidone 25 MG tablet Commonly known as: HYGROTON TAKE 1 TABLET BY MOUTH EVERY DAY   FISH OIL CONCENTRATE PO Take 1 tablet by mouth daily.   lisinopril 20 MG tablet Commonly known as: ZESTRIL TAKE 1 TABLET BY MOUTH EVERY DAY   meloxicam 15 MG tablet Commonly known  as: MOBIC Take 1 tablet (15 mg total) by mouth daily. Needs to be seen for further refills.   PROBIOTIC DAILY PO Take 1 tablet by mouth daily.   pyridOXINE 100 MG tablet Commonly known as: VITAMIN B-6 Take 100 mg by mouth daily.   sildenafil 100 MG tablet Commonly known as: Viagra Take 0.5-1 tablets (50-100 mg total) by mouth daily as needed for erectile dysfunction.   vitamin B-12 100 MCG tablet Commonly known as: CYANOCOBALAMIN Take 100 mcg by mouth daily.   Vitamin D3 50 MCG (2000 UT) Tabs Take 1 tablet by mouth daily.       Allergies: No Known Allergies  Family History: Family History  Problem Relation Age of Onset  . Cancer Father 11  . Kidney cancer Father     Social History:  reports that he has never smoked. He has never used smokeless tobacco. He reports current alcohol use. He reports that he does not use drugs.  ROS: All other review of systems were reviewed and are negative except what is noted above in HPI  Physical Exam: BP 111/69   Pulse 62   Temp 97.7 F (36.5 C)   Ht 5\' 6"  (1.676 m)   Wt 186 lb (84.4 kg)   BMI 30.02 kg/m   Constitutional:  Alert and oriented,  No acute distress. HEENT: [place] AT, moist mucus membranes.  Trachea midline, no masses. Cardiovascular: No clubbing, cyanosis, or edema. Respiratory: Normal respiratory effort, no increased work of breathing. GI: Abdomen is soft, nontender, nondistended, no abdominal masses GU: No CVA tenderness. Circumcised phallus. No masses/lesions on penis, testis, scrotum. Prostate 40g smooth no nodules no induration.  Lymph: No cervical or inguinal lymphadenopathy. Skin: No rashes, bruises or suspicious lesions. Neurologic: Grossly intact, no focal deficits, moving all 4 extremities. Psychiatric: Normal mood and affect.  Laboratory Data: Lab Results  Component Value Date   WBC 8.1 10/04/2019   HGB 16.7 10/04/2019   HCT 47.7 10/04/2019   MCV 92 10/04/2019   PLT 220 10/04/2019    Lab Results    Component Value Date   CREATININE 1.17 10/04/2019    No results found for: PSA  No results found for: TESTOSTERONE  Lab Results  Component Value Date   HGBA1C 5.2 06/21/2016    Urinalysis    Component Value Date/Time   COLORURINE YELLOW 04/14/2011 1131   APPEARANCEUR CLEAR 04/14/2011 1131   LABSPEC 1.018 04/14/2011 1131   PHURINE 7.5 04/14/2011 1131   GLUCOSEU NEGATIVE 04/14/2011 1131   HGBUR NEGATIVE 04/14/2011 1131   BILIRUBINUR negative 10/19/2019 1422   KETONESUR NEGATIVE 04/14/2011 1131   PROTEINUR Negative 10/19/2019 1422   PROTEINUR NEGATIVE 04/14/2011 1131   UROBILINOGEN 0.2 10/19/2019 1422   UROBILINOGEN 0.2 04/14/2011 1131   NITRITE negative 10/19/2019 1422   NITRITE NEGATIVE 04/14/2011 1131   LEUKOCYTESUR Negative 10/19/2019 1422    No results found for: LABMICR, WBCUA, RBCUA, LABEPIT, MUCUS, BACTERIA  Pertinent Imaging: Scrotal US 10/04/2019: Images reviewed and discussed with the patient  No results found for this or any previous visit.  No results found for this or any previous visit.  No results found for this or any previous visit.  No results found for this or any previous visit.  No results found for this or any previous visit.  No results found for this or any previous visit.  No results found for this or any previous visit.  No results found for this or any previous visit.   Assessment & Plan:    1. Left epididymal mass We discussed surveillance versus epididymal mass removal  And we have elected to proceed with surveillance. RTC 3 months with scrotal US - POCT urinalysis dipstick   No follow-ups on file.  Wilkie Aye, MD  [facility] [place]

## 2019-10-19 NOTE — Progress Notes (Signed)
Urological Symptom Review  Patient is experiencing the following symptoms: Frequent urination Get up at night to urinate Erection problems (male only)   Review of Systems  Gastrointestinal (upper)  : Negative for upper GI symptoms  Gastrointestinal (lower) : Negative for lower GI symptoms  Constitutional : Negative for symptoms  Skin: Negative for skin symptoms  Eyes: Negative for eye symptoms  Ear/Nose/Throat : Sinus problems  Hematologic/Lymphatic: Negative for Hematologic/Lymphatic symptoms  Cardiovascular : Negative for cardiovascular symptoms  Respiratory : Negative for respiratory symptoms  Endocrine: Negative for endocrine symptoms  Musculoskeletal: Negative for musculoskeletal symptoms  Neurological: Negative for neurological symptoms  Psychologic: Negative for psychiatric symptoms  

## 2019-10-19 NOTE — Patient Instructions (Signed)
Epididymectomy  An epididymectomy is a surgery to remove the epididymis. The epididymis is a tiny, tightly coiled tube that stores sperm. You have two of these tubes, one over each of your testicles. Tell a health care provider about:  Any allergies you have.  All medicines you are taking, including vitamins, herbs, eye drops, creams, and over-the-counter medicines.  Any problems you or family members have had with anesthetic medicines.  Any blood disorders you have.  Any surgeries you have had.  Any medical conditions you have. What are the risks? Generally, this is a safe procedure. However, problems may occur, including:  Blood collection inside the sac that holds your testicles (scrotum).  Infection.  Shrinking of your testicle.  Formation of a blood clot in one of your blood vessels. What happens before the procedure? Staying hydrated Follow instructions from your health care provider about hydration, which may include:  Up to 2 hours before the procedure - you may continue to drink clear liquids, such as water, clear fruit juice, black coffee, and plain tea. Eating and drinking restrictions Follow instructions from your health care provider about eating and drinking, which may include:  8 hours before the procedure - stop eating heavy meals or foods such as meat, fried foods, or fatty foods.  6 hours before the procedure - stop eating light meals or foods, such as toast or cereal.  6 hours before the procedure - stop drinking milk or drinks that contain milk.  2 hours before the procedure - stop drinking clear liquids.  Ask your health care provider about: ? Changing or stopping your regular medicines. This is especially important if you are taking diabetes medicines or blood thinners. ? Taking medicines such as aspirin and ibuprofen. These medicines can thin your blood. Do not take these medicines before your procedure if your health care provider instructs you not  to.  Plan to have someone take you home after the procedure.  If you will be going home right after the procedure, plan to have someone with you for 24 hours. What happens during the procedure?  To reduce your risk of infection: ? Your health care team will wash or sanitize their hands. ? Your skin will be washed with soap. ? Hair may be removed from the surgical area.  An IV tube will be inserted into one of your veins. Medicine will flow through the IV tube and directly into your body during the procedure.  You may be given a medicine to help you relax (sedative).  You will be given medicine to make you fall sleep (general anesthetic).  A cut (incision) will be made in your scrotum.  Your testicle and epididymis will be pulled through the incision.  The epididymis will be separated from your testicle and removed.  Your testicle will be put back into your scrotum.  The incision will be closed with stitches (sutures).  A small drain may be placed in the incision. The drain allows extra blood or fluid to flow out of your scrotum after the procedure. The procedure may vary among health care providers and hospitals. What happens after the procedure?  Your blood pressure, heart rate, breathing rate, and blood oxygen level will be monitored until the medicines you were given have worn off.  If you received a drain, you may need to stay in the hospital for a night until the drain is removed.  Do not drive for 24 hours if you received a sedative. This information is not  intended to replace advice given to you by your health care provider. Make sure you discuss any questions you have with your health care provider. Document Revised: 08/15/2015 Document Reviewed: 02/11/2015 Elsevier Patient Education  2020 [employer].

## 2019-12-13 ENCOUNTER — Encounter: Payer: Self-pay | Admitting: *Deleted

## 2019-12-13 ENCOUNTER — Other Ambulatory Visit: Payer: Self-pay

## 2019-12-13 ENCOUNTER — Ambulatory Visit: Payer: 59 | Admitting: Cardiology

## 2019-12-13 ENCOUNTER — Encounter: Payer: Self-pay | Admitting: Cardiology

## 2019-12-13 ENCOUNTER — Telehealth: Payer: Self-pay | Admitting: Cardiology

## 2019-12-13 VITALS — BP 110/78 | HR 72 | Ht 66.0 in | Wt 191.0 lb

## 2019-12-13 DIAGNOSIS — I10 Essential (primary) hypertension: Secondary | ICD-10-CM

## 2019-12-13 DIAGNOSIS — E782 Mixed hyperlipidemia: Secondary | ICD-10-CM | POA: Diagnosis not present

## 2019-12-13 DIAGNOSIS — Z953 Presence of xenogenic heart valve: Secondary | ICD-10-CM | POA: Diagnosis not present

## 2019-12-13 DIAGNOSIS — I25118 Atherosclerotic heart disease of native coronary artery with other forms of angina pectoris: Secondary | ICD-10-CM

## 2019-12-13 MED ORDER — ATORVASTATIN CALCIUM 40 MG PO TABS: 40.0000 mg | ORAL_TABLET | Freq: Every day | ORAL | 3 refills | Status: DC

## 2019-12-13 NOTE — Patient Instructions (Addendum)
Medication Instructions:    Your physician has recommended you make the following change in your medication:   Increase atorvastatin to 40 mg by mouth daily  Continue other medications the same  Labwork:  None  Testing/Procedures: Your physician has requested that you have a lexiscan myoview. For further information please visit [email or web address] Please follow instruction sheet, as given.  Follow-Up:  Your physician recommends that you schedule a follow-up appointment in: 1 year. You will receive a reminder letter in the mail in about 10 months reminding you to call and schedule your appointment. If you don't receive this letter, please contact our office.  Any Other Special Instructions Will Be Listed Below (If Applicable).  If you need a refill on your cardiac medications before your next appointment, please call your pharmacy.

## 2019-12-13 NOTE — Progress Notes (Signed)
Cardiology Office Note  Date: 12/13/2019   ID: Justin [name], DOB 01/22/1956, MRN [id number]  PCP:  Dettinger, Elige Radon, MD  Cardiologist:  Nona Dell, MD Electrophysiologist:  None   Chief Complaint  Patient presents with  . Cardiac follow-up    History of Present Illness: Justin [name] is a 64 y.o. male former patient of Dr. Purvis Sheffield now presenting to establish follow-up with me.  I reviewed his records and updated the chart.  He was last seen in March 2020.  He presents today for a routine assessment.  He does not report any active angina.  He does tell me that he has had more fatigue with activity, specifically notices this at work when he has to walk up flights of steps.  He has 6 flights to navigate, used to be able to walk up 4 to 5 without stopping, now it is closer to 3.  He works at [employer] in [place].  Follow-up echocardiogram in June 2020 revealed LVEF 55% with normally functioning bioprosthetic AVR, mean gradient 6 mmHg, no aortic regurgitation or paravalvular leak.  Aortic root mildly dilated.  I personally reviewed his ECG today which shows normal sinus rhythm.  I also reviewed his medications which are listed below.  His most recent lipid panel showed LDL up to 102 from 62.  He states that he has been compliant with Lipitor at current dose.  His cardiac catheterization prior to valve surgery in 2013 revealed 50 to 60% RCA stenosis.  He has not undergone any recent ischemic testing.  Past Medical History:  Diagnosis Date  . Bicuspid aortic valve 04/16/2011   Complicated by severe aortic regurgitation secondary to cusp prolapse, status post 98mm Edwards bioprosthesic AVR   . Essential hypertension     Past Surgical History:  Procedure Laterality Date  . ANAL FISSURE REPAIR    . AORTIC VALVE REPLACEMENT  04/16/2011   Procedure: AORTIC VALVE REPLACEMENT (AVR);  Surgeon: Purcell Nails, MD;  Location: [facility];  Service: Open Heart Surgery;   Laterality: N/A;  aortic valve replacement  . Left arm surgery    . VASECTOMY      Current Outpatient Medications  Medication Sig Dispense Refill  . acetaminophen (TYLENOL) 500 MG tablet Take 1,000 mg by mouth every 6 (six) hours as needed for pain.     Marland Kitchen aspirin 81 MG tablet Take 81 mg by mouth daily.    . chlorthalidone (HYGROTON) 25 MG tablet TAKE 1 TABLET BY MOUTH EVERY DAY 30 tablet 0  . Cholecalciferol (VITAMIN D3) 2000 UNITS TABS Take 1 tablet by mouth daily.      Marland Kitchen lisinopril (ZESTRIL) 20 MG tablet TAKE 1 TABLET BY MOUTH EVERY DAY 30 tablet 0  . Multiple Vitamins-Minerals (CENTRUM SILVER PO) Take 1 tablet by mouth daily.      . Olmesartan-amLODIPine-HCTZ (TRIBENZOR) 40-5-25 MG TABS Take 1 tablet by mouth daily.    . Omega-3 Fatty Acids (FISH OIL CONCENTRATE PO) Take 1 tablet by mouth daily.      . Probiotic Product (PROBIOTIC DAILY PO) Take 1 tablet by mouth daily.    Marland Kitchen pyridOXINE (VITAMIN B-6) 100 MG tablet Take 100 mg by mouth daily.    . sildenafil (VIAGRA) 100 MG tablet Take 0.5-1 tablets (50-100 mg total) by mouth daily as needed for erectile dysfunction. 10 tablet 5  . vitamin B-12 (CYANOCOBALAMIN) 100 MCG tablet Take 100 mcg by mouth daily.    Marland Kitchen atorvastatin (LIPITOR) 40 MG tablet Take  1 tablet (40 mg total) by mouth daily. 90 tablet 3   Current Facility-Administered Medications  Medication Dose Route Frequency Provider Last Rate Last Admin  . methylPREDNISolone acetate (DEPO-MEDROL) injection 80 mg  80 mg Intramuscular Once Dettinger, Elige Radon, MD       Allergies:  Patient has no known allergies.   ROS: No palpitations or syncope.  Physical Exam: VS:  BP 110/78   Pulse 72   Ht 5\' 6"  (1.676 m)   Wt 191 lb (86.6 kg)   SpO2 99%   BMI 30.83 kg/m , BMI Body mass index is 30.83 kg/m.  Wt Readings from Last 3 Encounters:  12/13/19 191 lb (86.6 kg)  10/19/19 186 lb (84.4 kg)  10/04/19 186 lb (84.4 kg)    General: Patient appears comfortable at rest. HEENT:  Conjunctiva and lids normal, wearing a mask. Neck: Supple, no elevated JVP or carotid bruits, no thyromegaly. Lungs: Clear to auscultation, nonlabored breathing at rest. Cardiac: Regular rate and rhythm, no S3, 2-3/6 systolic murmur, no diastolic murmur, no pericardial rub. Abdomen: Soft, nontender, bowel sounds present. Extremities: No pitting edema, distal pulses 2+.  ECG:  An ECG dated 04/10/2018 was personally reviewed today and demonstrated:  Sinus rhythm with left atrial enlargement and nonspecific T wave changes.  Recent Labwork: 10/04/2019: ALT 37; AST 28; BUN 24; Creatinine, Ser 1.17; Hemoglobin 16.7; Platelets 220; Potassium 4.3; Sodium 139     Component Value Date/Time   CHOL 175 10/04/2019 1059   TRIG 73 10/04/2019 1059   HDL 59 10/04/2019 1059   CHOLHDL 3.0 10/04/2019 1059   CHOLHDL 2.6 04/17/2018 0855   LDLCALC 102 (H) 10/04/2019 1059   LDLCALC 62 04/17/2018 0855    Other Studies Reviewed Today:  Echocardiogram 07/12/2018: 1. The left ventricle has a visually estimated ejection fraction of  approximately 55%. The cavity size was normal. There is mildly increased  left ventricular basal septal wall thickness. Left ventricular diastolic  parameters were normal.  2. The right ventricle has normal systolic function. The cavity was  normal. There is no increase in right ventricular wall thickness.  3. The mitral valve is grossly normal. Mild thickening of the mitral  valve leaflet.  4. The tricuspid valve is grossly normal.  5. A 29mm Edwards bioprosthesic valve is present in the aortic position.  No aortic regurgitation or perivalvular leak. Normal valve function with  mean gradient 6 mmHg.  6. There is mild dilatation of the aortic root.   Assessment and Plan:  1.  History of bicuspid valve with aortic stenosis and ultimately severe aortic regurgitation associated with cusp prolapse.  He is status post bioprosthetic AVR in 2013 as noted above.  Examination  consistent with this.  He had an echocardiogram last year that showed normal valve function without paravalvular leak and mean gradient 6 mmHg.  Continue observation.  2.  Exertional fatigue.  We will obtain a Lexiscan Myoview for ischemic assessment.  Cardiac catheterization prior to valve surgery in 2013 revealed a 50 to 60% RCA stenosis.  ECG today is normal.  3.  Mixed hyperlipidemia, reports compliance with Lipitor 20 mg daily.  Recent LDL 102.  Increase Lipitor to 40 mg daily.  4.  Essential hypertension, blood pressure is normal today.  He is also on lisinopril and chlorthalidone with follow-up by Dr. 2014.  Medication Adjustments/Labs and Tests Ordered: Current medicines are reviewed at length with the patient today.  Concerns regarding medicines are outlined above.   Tests Ordered: Orders Placed  This Encounter  Procedures  . NM Myocar Multi W/Spect W/Wall Motion / EF  . EKG 12-Lead    Medication Changes: Meds ordered this encounter  Medications  . atorvastatin (LIPITOR) 40 MG tablet    Sig: Take 1 tablet (40 mg total) by mouth daily.    Dispense:  90 tablet    Refill:  3    12/14/2019 dose increase    Disposition:  Follow up test results.  Signed, Jonelle Sidle, MD, [facility] 12/13/2019 4:18 PM    [facility] at [facility] [address] [place], [place], [place] [address] Phone: [phone]; Fax: [phone]

## 2019-12-13 NOTE — Telephone Encounter (Signed)
Pre-cert Verification for the following procedure    LEXISCAN   DATE:   12/24/2019  LOCATION: [facility]  

## 2019-12-15 ENCOUNTER — Encounter (HOSPITAL_COMMUNITY): Payer: Self-pay | Admitting: Emergency Medicine

## 2019-12-15 ENCOUNTER — Other Ambulatory Visit: Payer: Self-pay

## 2019-12-15 ENCOUNTER — Observation Stay (HOSPITAL_COMMUNITY)
Admission: EM | Admit: 2019-12-15 | Discharge: 2019-12-16 | Disposition: A | Discharge status: Home / Self Care | Payer: 59 | Attending: Internal Medicine | Admitting: Internal Medicine

## 2019-12-15 ENCOUNTER — Emergency Department (HOSPITAL_COMMUNITY): Payer: 59

## 2019-12-15 DIAGNOSIS — E860 Dehydration: Secondary | ICD-10-CM

## 2019-12-15 DIAGNOSIS — Z7982 Long term (current) use of aspirin: Secondary | ICD-10-CM | POA: Diagnosis not present

## 2019-12-15 DIAGNOSIS — Z952 Presence of prosthetic heart valve: Secondary | ICD-10-CM

## 2019-12-15 DIAGNOSIS — Z20822 Contact with and (suspected) exposure to covid-19: Secondary | ICD-10-CM | POA: Insufficient documentation

## 2019-12-15 DIAGNOSIS — I10 Essential (primary) hypertension: Secondary | ICD-10-CM | POA: Diagnosis not present

## 2019-12-15 DIAGNOSIS — R0789 Other chest pain: Principal | ICD-10-CM | POA: Insufficient documentation

## 2019-12-15 DIAGNOSIS — Z79899 Other long term (current) drug therapy: Secondary | ICD-10-CM | POA: Insufficient documentation

## 2019-12-15 DIAGNOSIS — Q231 Congenital insufficiency of aortic valve: Secondary | ICD-10-CM

## 2019-12-15 DIAGNOSIS — I351 Nonrheumatic aortic (valve) insufficiency: Secondary | ICD-10-CM | POA: Diagnosis present

## 2019-12-15 DIAGNOSIS — I2510 Atherosclerotic heart disease of native coronary artery without angina pectoris: Secondary | ICD-10-CM | POA: Insufficient documentation

## 2019-12-15 DIAGNOSIS — E782 Mixed hyperlipidemia: Secondary | ICD-10-CM

## 2019-12-15 DIAGNOSIS — M1712 Unilateral primary osteoarthritis, left knee: Secondary | ICD-10-CM

## 2019-12-15 LAB — CBC
HCT: 48.5 % (ref 39.0–52.0)
Hemoglobin: 17.5 g/dL — ABNORMAL HIGH (ref 13.0–17.0)
MCH: 32.4 pg (ref 26.0–34.0)
MCHC: 36.1 g/dL — ABNORMAL HIGH (ref 30.0–36.0)
MCV: 89.8 fL (ref 80.0–100.0)
Platelets: 172 10*3/uL (ref 150–400)
RBC: 5.4 MIL/uL (ref 4.22–5.81)
RDW: 12.7 % (ref 11.5–15.5)
WBC: 9.7 10*3/uL (ref 4.0–10.5)
nRBC: 0 % (ref 0.0–0.2)

## 2019-12-15 LAB — BASIC METABOLIC PANEL
Anion gap: 9 (ref 5–15)
BUN: 28 mg/dL — ABNORMAL HIGH (ref 8–23)
CO2: 23 mmol/L (ref 22–32)
Calcium: 9.1 mg/dL (ref 8.9–10.3)
Chloride: 102 mmol/L (ref 98–111)
Creatinine, Ser: 1.15 mg/dL (ref 0.61–1.24)
GFR, Estimated: >60 mL/min (ref >60)
Glucose, Bld: 117 mg/dL — ABNORMAL HIGH (ref 70–99)
Potassium: 3.5 mmol/L (ref 3.5–5.1)
Sodium: 134 mmol/L — ABNORMAL LOW (ref 135–145)

## 2019-12-15 LAB — TROPONIN I (HIGH SENSITIVITY)
Troponin I (High Sensitivity): 3 ng/L (ref <18)
Troponin I (High Sensitivity): 2 ng/L (ref <18)

## 2019-12-15 LAB — RESPIRATORY PANEL BY RT PCR (FLU A&B, COVID)
Influenza A by PCR: NEGATIVE
Influenza B by PCR: NEGATIVE
SARS Coronavirus 2 by RT PCR: NEGATIVE

## 2019-12-15 MED ORDER — VITAMIN B-12 100 MCG PO TABS
100.0000 ug | ORAL_TABLET | Freq: Every day | ORAL | Status: DC
  Administered 2019-12-16: 100 ug via ORAL
  Filled 2019-12-15 (×2): qty 1

## 2019-12-15 MED ORDER — FENTANYL CITRATE (PF) 100 MCG/2ML IJ SOLN
50.0000 ug | Freq: Once | INTRAMUSCULAR | Status: AC
  Administered 2019-12-15: 50 ug via INTRAVENOUS
  Filled 2019-12-15: qty 2

## 2019-12-15 MED ORDER — ONDANSETRON HCL 4 MG/2ML IJ SOLN
4.0000 mg | Freq: Once | INTRAMUSCULAR | Status: AC
  Administered 2019-12-15: 4 mg via INTRAVENOUS
  Filled 2019-12-15: qty 2

## 2019-12-15 MED ORDER — NITROGLYCERIN 0.4 MG SL SUBL
0.4000 mg | SUBLINGUAL_TABLET | SUBLINGUAL | Status: DC | PRN
  Administered 2019-12-15 (×2): 0.4 mg via SUBLINGUAL
  Filled 2019-12-15: qty 1

## 2019-12-15 MED ORDER — ATORVASTATIN CALCIUM 40 MG PO TABS
40.0000 mg | ORAL_TABLET | Freq: Every day | ORAL | Status: DC
  Administered 2019-12-16: 40 mg via ORAL
  Filled 2019-12-15: qty 1

## 2019-12-15 MED ORDER — SODIUM CHLORIDE 0.9 % IV SOLN: Freq: Once | INTRAVENOUS | Status: AC

## 2019-12-15 MED ORDER — LISINOPRIL 10 MG PO TABS: 20.0000 mg | ORAL_TABLET | Freq: Every day | ORAL | Status: DC

## 2019-12-15 MED ORDER — VITAMIN D 25 MCG (1000 UNIT) PO TABS
2000.0000 [IU] | ORAL_TABLET | Freq: Every day | ORAL | Status: DC
  Administered 2019-12-16: 2000 [IU] via ORAL
  Filled 2019-12-15 (×3): qty 2

## 2019-12-15 MED ORDER — ENOXAPARIN SODIUM 40 MG/0.4ML ~~LOC~~ SOLN
40.0000 mg | SUBCUTANEOUS | Status: DC
  Administered 2019-12-16: 40 mg via SUBCUTANEOUS
  Filled 2019-12-15: qty 0.4

## 2019-12-15 MED ORDER — ASPIRIN 81 MG PO CHEW
81.0000 mg | CHEWABLE_TABLET | Freq: Every day | ORAL | Status: DC
  Administered 2019-12-16: 81 mg via ORAL
  Filled 2019-12-15: qty 1

## 2019-12-15 MED ORDER — ASPIRIN 81 MG PO CHEW
324.0000 mg | CHEWABLE_TABLET | Freq: Once | ORAL | Status: AC
  Administered 2019-12-15: 324 mg via ORAL
  Filled 2019-12-15: qty 4

## 2019-12-15 MED ORDER — ACETAMINOPHEN 325 MG PO TABS: 650.0000 mg | ORAL_TABLET | Freq: Four times a day (QID) | ORAL | Status: DC | PRN

## 2019-12-15 NOTE — H&P (Signed)
History and Physical  Justin [name] KVQ:[id number] DOB: 1955-04-28 DOA: 12/15/2019  Referring physician: Arthor Captain, PA-C PCP: Dettinger, Elige Radon, MD  Patient coming from: Home  Chief Complaint: Chest pain  HPI: Justin [name] is a 64 y.o. male with medical history significant for bicuspid aortic valve s/p aortic valve replacement (bioprosthetic tissue valve on 04/16/2011), hypertension, hyperlipidemia, vitamin D deficiency who presents to the emergency department due to chest pain which started this morning at rest around 11 AM.  Chest pain was in left substernal area with radiation to right side and was described as sharp and rated as 9/10 on pain scale, it was associated with shortness of breath, it was intermittent and was with no alleviating/aggravating factor.  Chest pain was nonradiating and was nonreproducible.  He denies fever, chills, heartburn, cough, abdominal pain. Patient saw her cardiologist 2 days ago (11/4) for cardiac follow-up and was noted to have exertional fatigue with plan to obtain a Lexiscan Myoview for ischemic assessment.  ED Course:  In the emergency department, he was hemodynamically stable.  Work-up in the ED showed normal CBC and BMP except for hemoglobin of 17.5, Na 134, BUN 28.  Troponin x2 was negative.  Chest x-ray showed no acute cardiopulmonary finding.  Aspirin 324 mg and nitroglycerin sublingual was given without much improvement, IV fentanyl 50 mcg x 1 was given with improvement in patient's chest pain to a 2/10 on pain scale.  Cardiology fellow on-call was consulted and recommended monitoring patient  overnight with plan to discharge patient in the morning if he continues to be stable and to follow-up with outpatient cardiologist regarding Scheduled Lexiscan Myoview for ischemic assessment per ED PA.   Review of Systems: Constitutional: Negative for chills and fever.  HENT: Negative for ear pain and sore throat.   Eyes: Negative for pain and visual  disturbance.  Respiratory: Positive for shortness of breath.  Negative for cough, chest tightness  Cardiovascular: Positive for chest pain and negative for palpitations.  Gastrointestinal: Negative for abdominal pain and vomiting.  Endocrine: Negative for polyphagia and polyuria.  Genitourinary: Negative for decreased urine volume, dysuria, enuresis Musculoskeletal: Negative for arthralgias and back pain.  Skin: Negative for color change and rash.  Allergic/Immunologic: Negative for immunocompromised state.  Neurological: Positive for headache. Negative for tremors, syncope, speechdifficulty, weakness, light-headedness Hematological: Does not bruise/bleed easily.  All other systems reviewed and are negative   Past Medical History:  Diagnosis Date  . Bicuspid aortic valve 04/16/2011   Complicated by severe aortic regurgitation secondary to cusp prolapse, status post 8mm Edwards bioprosthesic AVR   . Essential hypertension    Past Surgical History:  Procedure Laterality Date  . ANAL FISSURE REPAIR    . AORTIC VALVE REPLACEMENT  04/16/2011   Procedure: AORTIC VALVE REPLACEMENT (AVR);  Surgeon: Purcell Nails, MD;  Location: [facility] OR;  Service: Open Heart Surgery;  Laterality: N/A;  aortic valve replacement  . Left arm surgery    . VASECTOMY      Social History:  reports that he has never smoked. He has never used smokeless tobacco. He reports current alcohol use. He reports that he does not use drugs.   No Known Allergies  Family History  Problem Relation Age of Onset  . Cancer Father 61  . Kidney cancer Father    Prior to Admission medications   Medication Sig Start Date End Date Taking? Authorizing Provider  acetaminophen (TYLENOL) 500 MG tablet Take 1,000 mg by mouth every 6 (six) hours  as needed for pain.    Yes [provider]  aspirin 81 MG tablet Take 81 mg by mouth daily.   Yes [provider]  chlorthalidone (HYGROTON) 25 MG tablet TAKE 1 TABLET BY  MOUTH EVERY DAY 07/31/19  Yes Laqueta Linden, MD  Cholecalciferol (VITAMIN D3) 2000 UNITS TABS Take 1 tablet by mouth daily.     Yes [provider]  lisinopril (ZESTRIL) 20 MG tablet TAKE 1 TABLET BY MOUTH EVERY DAY 07/31/19  Yes Laqueta Linden, MD  Multiple Vitamins-Minerals (CENTRUM SILVER PO) Take 1 tablet by mouth daily.     Yes [provider]  Omega-3 Fatty Acids (FISH OIL CONCENTRATE PO) Take 1 tablet by mouth daily.     Yes [provider]  Probiotic Product (PROBIOTIC DAILY PO) Take 1 tablet by mouth daily.   Yes [provider]  pyridOXINE (VITAMIN B-6) 100 MG tablet Take 100 mg by mouth daily.   Yes [provider]  sildenafil (VIAGRA) 100 MG tablet Take 0.5-1 tablets (50-100 mg total) by mouth daily as needed for erectile dysfunction. 10/04/19  Yes Dettinger, Elige Radon, MD  vitamin B-12 (CYANOCOBALAMIN) 100 MCG tablet Take 100 mcg by mouth daily.   Yes [provider]  atorvastatin (LIPITOR) 40 MG tablet Take 1 tablet (40 mg total) by mouth daily. 12/13/19 03/12/20  Jonelle Sidle, MD    Physical Exam: BP 108/73   Pulse 91   Temp 98.2 F (36.8 C) (Oral)   Resp 19   Ht 5\' 6"  (1.676 m)   Wt 86.6 kg   SpO2 98%   BMI 30.81 kg/m   . General: 64 y.o. year-old male well developed well nourished in no acute distress.  Alert and oriented x3. 68 HEENT: Dry mucous membrane.  NCAT, EOMI . Neck: Supple, trachea medial . Cardiovascular: Regular rate and rhythm with no rubs or gallops.  No thyromegaly or JVD noted.  2/4 pulses in all 4 extremities. Marland Kitchen Respiratory: Clear to auscultation with no wheezes or rales. Good inspiratory effort. . Abdomen: Soft nontender nondistended with normal bowel sounds x4 quadrants. . Muskuloskeletal: Nontender to palpation of chest area.  No cyanosis, clubbing or edema noted bilaterally . Neuro: CN II-XII intact, strength, sensation, reflexes . Skin: No ulcerative lesions noted or  rashes . Psychiatry: Judgement and insight appear normal. Mood is appropriate for condition and setting          Labs on Admission:  Basic Metabolic Panel: Recent Labs  Lab 12/15/19 1959  NA 134*  K 3.5  CL 102  CO2 23  GLUCOSE 117*  BUN 28*  CREATININE 1.15  CALCIUM 9.1   Liver Function Tests: No results for input(s): AST, ALT, ALKPHOS, BILITOT, PROT, ALBUMIN in the last 168 hours. No results for input(s): LIPASE, AMYLASE in the last 168 hours. No results for input(s): AMMONIA in the last 168 hours. CBC: Recent Labs  Lab 12/15/19 1959  WBC 9.7  HGB 17.5*  HCT 48.5  MCV 89.8  PLT 172   Cardiac Enzymes: No results for input(s): CKTOTAL, CKMB, CKMBINDEX, TROPONINI in the last 168 hours.  BNP (last 3 results) No results for input(s): BNP in the last 8760 hours.  ProBNP (last 3 results) No results for input(s): PROBNP in the last 8760 hours.  CBG: No results for input(s): GLUCAP in the last 168 hours.  Radiological Exams on Admission: DG Chest Port 1 View  Result Date: 12/15/2019 CLINICAL DATA:  Per triage note; Pt c/o  chest pain that started at 1100 today. States he was just seen at the cardiologist this past Thursday and has heart cath scheduled Monday EXAM: PORTABLE CHEST 1 VIEW COMPARISON:  Chest radiograph 05/17/2011 FINDINGS: Stable cardiomediastinal contours status post median sternotomy. The lungs are clear. No pneumothorax or large pleural effusion. Right shoulder degenerative changes. IMPRESSION: No acute cardiopulmonary finding. Electronically Signed   By: Emmaline KluverNancy  Ballantyne M.D.   On: 12/15/2019 20:17    EKG: I independently viewed the EKG done and my findings are as followed: Sinus rhythm at a rate of 80 bpm, multiple PVCs with nonspecific T wave abnormality.  Assessment/Plan Present on Admission: . Atypical chest pain . Hypertension . AI (aortic insufficiency)  Principal Problem:   Atypical chest pain Active Problems:   Hypertension   Bicuspid  aortic valve   AI (aortic insufficiency)   S/P aortic valve replacement   Mixed hyperlipidemia   Dehydration  Atypical chest pain Cardiovascular risk factors include history of bicuspid aortic valve with aortic stenosis s/p AVR, hypertension, hyperlipidemia, obesity and male sex Patient will be admitted to telemetry and placed in Observation status  Troponins x 2; 3 > 2 EKG personally reviewed showed Sinus rhythm at a rate of 80 bpm, multiple PVCs with nonspecific T wave abnormality. Patient was seen 2 days ago (11/4) by Dr. Diona BrownerMcDowell and was deemed to have exertional fatigue with plan to obtain a Lexiscan Myoview for ischemic assessment. 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 Diona BrownerMcDowell for impending procedure planned.  History of bicuspid valve with aortic stenosis S/P aortic valve replacement (2013) Continue aspirin, lisinopril Echocardiogram done in July 12, 2018 showed an EF of approximately 55% Refer to echocardiogram done at that time below  Dehydration Continue IV hydration  Essential hypertension  Continue home lisinopril with holding parameters  Mixed hyperlipidemia Continue Lipitor per home regimen  Vitamin D deficiency Continue cholecalciferol  Echocardiogram 07/12/2018: 1. The left ventricle has a visually estimated ejection fraction of  approximately 55%. The cavity size was normal. There is mildly increased  left ventricular basal septal wall thickness. Left  ventricular diastolic  parameters were normal.  2. The right ventricle has normal systolic function. The cavity was  normal. There is no increase in right ventricular wall thickness.  3. The mitral valve is grossly normal. Mild thickening of the mitral  valve leaflet.  4. The tricuspid valve is grossly normal.  5. A 27mm Edwards bioprosthesic valve is present in the aortic position.  No aortic regurgitation or perivalvular leak. Normal valve function with  mean gradient 6 mmHg.  6. There is mild dilatation of the aortic root.   DVT prophylaxis: Lovenox  Code Status: Full code  Family Communication: Wife at bedside (all questions answered to satisfaction)  Disposition Plan:  Patient is from:                        home Anticipated DC to:  home Anticipated DC date:                1 day Anticipated DC barriers:          Patient is unstable to be discharged at this time due to atypical chest pain requiring chest pain rule out.   Consults called: None  Admission status: Observation    Frankey Shown MD [facility]  12/15/2019, 11:42 PM

## 2019-12-15 NOTE — ED Provider Notes (Signed)
[facility] EMERGENCY DEPARTMENT Provider Note   CSN: [id number] Arrival date & time: 12/15/19  1918     History Chief Complaint  Patient presents with  . Chest Pain    Justin [name] is a 64 y.o. male.  The history is provided by the patient. No language interpreter was used.  Chest Pain Pain location:  Substernal area Pain quality: pressure   Pain radiates to:  Precordial region Pain severity:  Severe Onset quality:  Gradual Duration:  10 hours Timing:  Intermittent Progression:  Worsening Chronicity:  New Context: at rest   Worsened by:  Nothing Ineffective treatments: tylenol. Associated symptoms: headache and shortness of breath   Associated symptoms: no abdominal pain, no AICD problem, no altered mental status, no anorexia, no anxiety, no back pain, no claudication, no cough, no diaphoresis, no dysphagia, no fatigue, no fever, no heartburn, no lower extremity edema, no nausea, no near-syncope, no numbness, no orthopnea, no palpitations, no PND, no syncope, no vomiting and no weakness   Risk factors: coronary artery disease, high cholesterol, hypertension and male sex        Past Medical History:  Diagnosis Date  . Bicuspid aortic valve 04/16/2011   Complicated by severe aortic regurgitation secondary to cusp prolapse, status post 34mm Edwards bioprosthesic AVR   . Essential hypertension     Patient Active Problem List   Diagnosis Date Noted  . Orthostatic hypotension 06/28/2011  . Tachycardia 06/28/2011  . Bacteremia 06/28/2011  . Acute renal failure (HCC) 06/28/2011  . Headache(784.0) 05/06/2011  . CAD (coronary artery disease) 05/06/2011  . S/P aortic valve replacement 04/16/2011  . Aortic valve, bicuspid 03/10/2011  . AI (aortic insufficiency) 03/09/2011  . Bicuspid aortic valve 02/21/2011  . Hypertension 05/04/2010  . Anal fissure 05/04/2010  . Hx of vasectomy 05/04/2010    Past Surgical History:  Procedure Laterality Date  . ANAL FISSURE  REPAIR    . AORTIC VALVE REPLACEMENT  04/16/2011   Procedure: AORTIC VALVE REPLACEMENT (AVR);  Surgeon: Purcell Nails, MD;  Location: [facility] OR;  Service: Open Heart Surgery;  Laterality: N/A;  aortic valve replacement  . Left arm surgery    . VASECTOMY         Family History  Problem Relation Age of Onset  . Cancer Father 88  . Kidney cancer Father     Social History   Tobacco Use  . Smoking status: Never Smoker  . Smokeless tobacco: Never Used  Vaping Use  . Vaping Use: Never used  Substance Use Topics  . Alcohol use: Yes    Comment: Occasionally--socially  . Drug use: No    Home Medications Prior to Admission medications   Medication Sig Start Date End Date Taking? Authorizing Provider  acetaminophen (TYLENOL) 500 MG tablet Take 1,000 mg by mouth every 6 (six) hours as needed for pain.     [provider]  aspirin 81 MG tablet Take 81 mg by mouth daily.    [provider]  atorvastatin (LIPITOR) 40 MG tablet Take 1 tablet (40 mg total) by mouth daily. 12/13/19 03/12/20  Jonelle Sidle, MD  chlorthalidone (HYGROTON) 25 MG tablet TAKE 1 TABLET BY MOUTH EVERY DAY 07/31/19   Laqueta Linden, MD  Cholecalciferol (VITAMIN D3) 2000 UNITS TABS Take 1 tablet by mouth daily.      [provider]  lisinopril (ZESTRIL) 20 MG tablet TAKE 1 TABLET BY MOUTH EVERY DAY 07/31/19   Laqueta Linden, MD  Multiple Vitamins-Minerals (  CENTRUM SILVER PO) Take 1 tablet by mouth daily.      [provider]  Olmesartan-amLODIPine-HCTZ (TRIBENZOR) 40-5-25 MG TABS Take 1 tablet by mouth daily.    [provider]  Omega-3 Fatty Acids (FISH OIL CONCENTRATE PO) Take 1 tablet by mouth daily.      [provider]  Probiotic Product (PROBIOTIC DAILY PO) Take 1 tablet by mouth daily.    [provider]  pyridOXINE (VITAMIN B-6) 100 MG tablet Take 100 mg by mouth daily.    [provider]  sildenafil (VIAGRA) 100 MG tablet Take 0.5-1  tablets (50-100 mg total) by mouth daily as needed for erectile dysfunction. 10/04/19   Dettinger, Elige Radon, MD  vitamin B-12 (CYANOCOBALAMIN) 100 MCG tablet Take 100 mcg by mouth daily.    [provider]    Allergies    Patient has no known allergies.  Review of Systems   Review of Systems  Constitutional: Negative for diaphoresis, fatigue and fever.  HENT: Negative for trouble swallowing.   Respiratory: Positive for shortness of breath. Negative for cough.   Cardiovascular: Positive for chest pain. Negative for palpitations, orthopnea, claudication, syncope, PND and near-syncope.  Gastrointestinal: Negative for abdominal pain, anorexia, heartburn, nausea and vomiting.  Endocrine: Negative.   Genitourinary: Negative.   Musculoskeletal: Negative for back pain.  Neurological: Positive for headaches. Negative for weakness and numbness.  Hematological: Negative.     Physical Exam Updated Vital Signs BP 112/84   Pulse 85   Temp 97.7 F (36.5 C)   Resp 18   Ht 5\' 6"  (1.676 m)   Wt 86.6 kg   SpO2 100%   BMI 30.81 kg/m   Physical Exam Vitals and nursing note reviewed.  Constitutional:      General: He is not in acute distress.    Appearance: He is well-developed. He is not diaphoretic.     Comments: Appears uncomfortable, tearful  HENT:     Head: Normocephalic and atraumatic.  Eyes:     General: No scleral icterus.    Conjunctiva/sclera: Conjunctivae normal.  Cardiovascular:     Rate and Rhythm: Normal rate and regular rhythm.     Heart sounds: Normal heart sounds.  Pulmonary:     Effort: Pulmonary effort is normal. No respiratory distress.     Breath sounds: Normal breath sounds.  Abdominal:     Palpations: Abdomen is soft.     Tenderness: There is no abdominal tenderness.  Musculoskeletal:     Cervical back: Normal range of motion and neck supple.  Skin:    General: Skin is warm and dry.  Neurological:     Mental Status: He is alert.  Psychiatric:         Behavior: Behavior normal.     ED Results / Procedures / Treatments   Labs (all labs ordered are listed, but only abnormal results are displayed) Labs Reviewed  BASIC METABOLIC PANEL  CBC  TROPONIN I (HIGH SENSITIVITY)    EKG EKG Interpretation  Date/Time:  Saturday December 15 2019 20:07:10 EST Ventricular Rate:  83 PR Interval:    QRS Duration: 99 QT Interval:  353 QTC Calculation: 415 R Axis:   -27 Text Interpretation: Sinus rhythm Ventricular premature complex Borderline left axis deviation Abnormal R-wave progression, late transition Confirmed by [id number] ([id number]) on 12/16/2019 8:07:39 AM   Radiology No results found.  Procedures Procedures (including critical care time)  Medications Ordered in ED Medications  nitroGLYCERIN (NITROSTAT) SL tablet 0.4 mg (has  no administration in time range)  aspirin chewable tablet 324 mg (has no administration in time range)    ED Course  I have reviewed the triage vital signs and the nursing notes.  Pertinent labs & imaging results that were available during my care of the patient were reviewed by me and considered in my medical decision making (see chart for details).    MDM Rules/Calculators/A&P                          . Final Clinical Impression(s) / ED Diagnoses Final diagnoses:  None   XB:MWUXL pain VS: HDS/ afebrile KG:MWNUUVO is gathered by patient and wife, EMR. Previous records obtained and reviewed. DDX:The patient's complaint of chest pain involves an extensive number of diagnostic and treatment options, and is a complaint that carries with it a high risk of complications, morbidity, and potential mortality. Given the large differential diagnosis, medical decision making is of high complexity. The emergent differential diagnosis of chest pain includes: Acute coronary syndrome, pericarditis, aortic dissection, pulmonary embolism, tension pneumothorax, pneumonia, and esophageal rupture.  Labs: I ordered  reviewed and interpreted labs which include Cbc no leukocytosis, mildly elevated hemoglobin likely represents volume contraction.  BMP shows slightly elevated glucose level elevated BUN also suggestive of some dehydration.  Respiratory panel negative for Covid.  Troponin is negative.  Imaging: I ordered and reviewed images which included portable 1 view chest x-ray. I independently visualized and interpreted all imaging. There are no acute, significant findings on today's images. EKG: Sinus rhythm at a rate of 83 without evident ischemic changes Consults:Dr. Narcisse who recommends [facility] admission given upcoming catheterization. MDM: Patient with chest pain.  Known CAD.  His chest pain is atypical for cardiac presentation.  I have extremely low suspicion for acute coronary syndrome.  Patient did not have any response with aspirin and nitroglycerin.  He will be admitted for observation. Patient disposition:The patient appears reasonably stabilized for admission considering the current resources, flow, and capabilities available in the ED at this time, and I doubt any other [facility] requiring further screening and/or treatment in the ED prior to admission.        Rx / DC Orders ED Discharge Orders    None       Arthor Captain, PA-C 12/18/19 [id number]    Bethann Berkshire, MD 12/18/19 1553

## 2019-12-15 NOTE — ED Triage Notes (Signed)
Pt c/o chest pain that started at 1100 today. States he was just seen at the cardiologist this past Thursday and has heart cath scheduled Monday.

## 2019-12-15 NOTE — ED Notes (Signed)
Pulse ox is not crossing from monitor  Pt is on room air and his sats have been between 97-100 per cent RA since arrival

## 2019-12-15 NOTE — ED Notes (Signed)
Pt with artificial heart valve 8 years ago   Seen by [facility] in [place] on Thursday   Supposed to have heart cath on MOnday   Report CP since 1030 AM- no radiation to neck   Does not take NTG- but topok ASA 81 mg this am

## 2019-12-16 ENCOUNTER — Observation Stay (HOSPITAL_BASED_OUTPATIENT_CLINIC_OR_DEPARTMENT_OTHER): Payer: 59

## 2019-12-16 DIAGNOSIS — R0789 Other chest pain: Secondary | ICD-10-CM

## 2019-12-16 DIAGNOSIS — R079 Chest pain, unspecified: Secondary | ICD-10-CM | POA: Diagnosis not present

## 2019-12-16 DIAGNOSIS — Z952 Presence of prosthetic heart valve: Secondary | ICD-10-CM

## 2019-12-16 DIAGNOSIS — Q231 Congenital insufficiency of aortic valve: Secondary | ICD-10-CM | POA: Diagnosis not present

## 2019-12-16 DIAGNOSIS — E782 Mixed hyperlipidemia: Secondary | ICD-10-CM | POA: Diagnosis not present

## 2019-12-16 LAB — PHOSPHORUS: Phosphorus: 3.1 mg/dL (ref 2.5–4.6)

## 2019-12-16 LAB — ECHOCARDIOGRAM COMPLETE
AR max vel: 2.14 cm2
AV Area VTI: 2.41 cm2
AV Area mean vel: 1.84 cm2
AV Mean grad: 7.6 mmHg
AV Peak grad: 10.7 mmHg
Ao pk vel: 1.63 m/s
Area-P 1/2: 3.17 cm2
Height: 66 in
Weight: 3054.69 oz

## 2019-12-16 LAB — COMPREHENSIVE METABOLIC PANEL
ALT: 27 U/L (ref 0–44)
AST: 20 U/L (ref 15–41)
Albumin: 3.4 g/dL — ABNORMAL LOW (ref 3.5–5.0)
Alkaline Phosphatase: 27 U/L — ABNORMAL LOW (ref 38–126)
Anion gap: 7 (ref 5–15)
BUN: 23 mg/dL (ref 8–23)
CO2: 24 mmol/L (ref 22–32)
Calcium: 8.2 mg/dL — ABNORMAL LOW (ref 8.9–10.3)
Chloride: 104 mmol/L (ref 98–111)
Creatinine, Ser: 0.97 mg/dL (ref 0.61–1.24)
GFR, Estimated: >60 mL/min (ref >60)
Glucose, Bld: 103 mg/dL — ABNORMAL HIGH (ref 70–99)
Potassium: 3.3 mmol/L — ABNORMAL LOW (ref 3.5–5.1)
Sodium: 135 mmol/L (ref 135–145)
Total Bilirubin: 0.9 mg/dL (ref 0.3–1.2)
Total Protein: 5.8 g/dL — ABNORMAL LOW (ref 6.5–8.1)

## 2019-12-16 LAB — CBC
HCT: 41.9 % (ref 39.0–52.0)
Hemoglobin: 15.2 g/dL (ref 13.0–17.0)
MCH: 32.9 pg (ref 26.0–34.0)
MCHC: 36.3 g/dL — ABNORMAL HIGH (ref 30.0–36.0)
MCV: 90.7 fL (ref 80.0–100.0)
Platelets: 137 10*3/uL — ABNORMAL LOW (ref 150–400)
RBC: 4.62 MIL/uL (ref 4.22–5.81)
RDW: 12.9 % (ref 11.5–15.5)
WBC: 5.1 10*3/uL (ref 4.0–10.5)
nRBC: 0 % (ref 0.0–0.2)

## 2019-12-16 LAB — PROTIME-INR
INR: 1.2 (ref 0.8–1.2)
Prothrombin Time: 14.3 seconds (ref 11.4–15.2)

## 2019-12-16 LAB — MAGNESIUM: Magnesium: 1.7 mg/dL (ref 1.7–2.4)

## 2019-12-16 LAB — HIV ANTIBODY (ROUTINE TESTING W REFLEX): HIV Screen 4th Generation wRfx: NONREACTIVE

## 2019-12-16 LAB — APTT: aPTT: 29 seconds (ref 24–36)

## 2019-12-16 MED ORDER — POTASSIUM CHLORIDE CRYS ER 20 MEQ PO TBCR
20.0000 meq | EXTENDED_RELEASE_TABLET | Freq: Once | ORAL | Status: AC
  Administered 2019-12-16: 20 meq via ORAL
  Filled 2019-12-16: qty 1

## 2019-12-16 MED ORDER — ONDANSETRON HCL 4 MG/2ML IJ SOLN: 4.0000 mg | Freq: Four times a day (QID) | INTRAMUSCULAR | Status: DC | PRN

## 2019-12-16 MED ORDER — MAGNESIUM SULFATE 2 GM/50ML IV SOLN
2.0000 g | Freq: Once | INTRAVENOUS | Status: AC
  Administered 2019-12-16: 2 g via INTRAVENOUS
  Filled 2019-12-16: qty 50

## 2019-12-16 NOTE — ED Notes (Signed)
Echo in progress.

## 2019-12-16 NOTE — ED Notes (Signed)
Echo completed at bedside

## 2019-12-16 NOTE — ED Notes (Signed)
Ambulated to BR without assistance.  Gait steady.  No c/o pain or discomfort.

## 2019-12-16 NOTE — Discharge Summary (Signed)
Physician Discharge Summary  LASHON HILLIER DDU:[id number] DOB: 23-Nov-1955 DOA: 12/15/2019  PCP: Dettinger, Elige Radon, MD  Admit date: 12/15/2019 Discharge date: 12/16/2019  Admitted From: Home Disposition:  Home   Recommendations for Outpatient Follow-up:  1. Follow up with PCP in 1-2 weeks 2. Please obtain BMP/CBC in one week   Discharge Condition: Stable CODE STATUS: FULL Diet recommendation: Heart Healthy   Brief/Interim Summary: 64 year old male with a history of hypertension, hyperlipidemia, vitamin D deficiency, bicuspid aortic valve status post bioprosthetic valve replacement 04/16/2011 presenting with chest pain in the morning of 12/15/2019.  The patient normally works third shift at [employer].  He came home to sleep.  He woke up in the morning of 12/15/2019 and felt left-sided and substernal chest pain that was sharp in nature.  He has some shortness of breath and nausea.  He stated that he subsequently went to lunch with his wife, but he did not eat anything.  He stated that the chest pain was about the same throughout the day and lasted about 8 hours.  Because of its persistence, he came to the emergency department for further evaluation.  Notably, he recently saw Dr. Diona Browner in the office on 12/13/2019 secondary to exertional dyspnea.  Myoview is tentatively scheduled for 12/24/2019.  The patient denied any exertional chest pain this past week.  He denied any syncope, fevers, chills, cough, hemoptysis, vomiting, diarrhea, abdominal pain.  He denies any new medications.  In the emergency department, the patient was afebrile hemodynamically stable with oxygen saturation 97-99% on room air.  BMP and LFTs were essentially unremarkable.  Chest x-ray was negative.  There was no relief with nitroglycerin or aspirin.  EKG showed sinus rhythm with no ST-T wave changes.  Troponin was 3>>> 2 He subsequently received IV fentanyl with relief of his chest pain in the emergency department.  He  did not have any recurrence of his chest discomfort.  He remained hemodynamically stable and his echo was reassuring.  Discharge Diagnoses:  Chest pain -Somewhat atypical by description although the patient has had some exertional dyspnea -Troponins negative  -Continue aspirin 81 mg daily -Personally reviewed EKG--sinus rhythm, no ST-T wave changes -Personally reviewed chest x-ray--no edema or infiltrates -Echo-60-65%, Left ventricular endocardial border  not optimally defined to evaluate regional wall motion. There is mild LVH -patient is stable for discharge with follow-up with cardiology and his scheduled Myoview on 12/24/2019  Essential hypertension -Continue lisinopril -Holding chlorthalidone secondary to hyponatremia presently -Blood pressure remained controlled -Follow-up outpatient PCP  Hyperlipidemia -Continue statin  Bicuspid aortic valve -Status post bioprosthetic valve replacement 04/16/2011 -07/12/2018 echo EF 55%, normal diastolic function, no bioprosthetic valve leak   Discharge Instructions   Allergies as of 12/16/2019   No Known Allergies     Medication List    TAKE these medications   acetaminophen 500 MG tablet Commonly known as: TYLENOL Take 1,000 mg by mouth every 6 (six) hours as needed for pain.   aspirin 81 MG tablet Take 81 mg by mouth daily.   atorvastatin 40 MG tablet Commonly known as: LIPITOR Take 1 tablet (40 mg total) by mouth daily.   CENTRUM SILVER PO Take 1 tablet by mouth daily.   chlorthalidone 25 MG tablet Commonly known as: HYGROTON TAKE 1 TABLET BY MOUTH EVERY DAY   FISH OIL CONCENTRATE PO Take 1 tablet by mouth daily.   lisinopril 20 MG tablet Commonly known as: ZESTRIL TAKE 1 TABLET BY MOUTH EVERY DAY   PROBIOTIC DAILY PO  Take 1 tablet by mouth daily.   pyridOXINE 100 MG tablet Commonly known as: VITAMIN B-6 Take 100 mg by mouth daily.   sildenafil 100 MG tablet Commonly known as: Viagra Take 0.5-1 tablets  (50-100 mg total) by mouth daily as needed for erectile dysfunction.   vitamin B-12 100 MCG tablet Commonly known as: CYANOCOBALAMIN Take 100 mcg by mouth daily.   Vitamin D3 50 MCG (2000 UT) Tabs Take 1 tablet by mouth daily.       No Known Allergies  Consultations:  none   Procedures/Studies: DG Chest Port 1 View  Result Date: 12/15/2019 CLINICAL DATA:  Per triage note; Pt c/o chest pain that started at 1100 today. States he was just seen at the cardiologist this past Thursday and has heart cath scheduled Monday EXAM: PORTABLE CHEST 1 VIEW COMPARISON:  Chest radiograph 05/17/2011 FINDINGS: Stable cardiomediastinal contours status post median sternotomy. The lungs are clear. No pneumothorax or large pleural effusion. Right shoulder degenerative changes. IMPRESSION: No acute cardiopulmonary finding. Electronically Signed   By: Emmaline Kluver M.D.   On: 12/15/2019 20:17        Discharge Exam: Vitals:   12/16/19 0500 12/16/19 0705  BP: 92/66 109/86  Pulse: 77 74  Resp: 16 15  Temp:    SpO2: 99% 100%   Vitals:   12/16/19 0230 12/16/19 0330 12/16/19 0500 12/16/19 0705  BP: 99/81 106/77 92/66 109/86  Pulse: 83 80 77 74  Resp: 16 (!) 23 16 15   Temp:      TempSrc:      SpO2: 99% 99% 99% 100%  Weight:      Height:        General: Pt is alert, awake, not in acute distress Cardiovascular: RRR, S1/S2 +, no rubs, no gallops Respiratory: CTA bilaterally, no wheezing, no rhonchi Abdominal: Soft, NT, ND, bowel sounds + Extremities: no edema, no cyanosis   The results of significant diagnostics from this hospitalization (including imaging, microbiology, ancillary and laboratory) are listed below for reference.    Significant Diagnostic Studies: DG Chest Port 1 View  Result Date: 12/15/2019 CLINICAL DATA:  Per triage note; Pt c/o chest pain that started at 1100 today. States he was just seen at the cardiologist this past Thursday and has heart cath scheduled Monday  EXAM: PORTABLE CHEST 1 VIEW COMPARISON:  Chest radiograph 05/17/2011 FINDINGS: Stable cardiomediastinal contours status post median sternotomy. The lungs are clear. No pneumothorax or large pleural effusion. Right shoulder degenerative changes. IMPRESSION: No acute cardiopulmonary finding. Electronically Signed   By: 07/17/2011 M.D.   On: 12/15/2019 20:17     Microbiology: Recent Results (from the past 240 hour(s))  Respiratory Panel by RT PCR (Flu A&B, Covid) - Nasopharyngeal Swab     Status: None   Collection Time: 12/15/19  8:03 PM   Specimen: Nasopharyngeal Swab  Result Value Ref Range Status   SARS Coronavirus 2 by RT PCR NEGATIVE NEGATIVE Final    Comment: (NOTE) SARS-CoV-2 target nucleic acids are NOT DETECTED.  The SARS-CoV-2 RNA is generally detectable in upper respiratoy specimens during the acute phase of infection. The lowest concentration of SARS-CoV-2 viral copies this assay can detect is 131 copies/mL. A negative result does not preclude SARS-Cov-2 infection and should not be used as the sole basis for treatment or other patient management decisions. A negative result may occur with  improper specimen collection/handling, submission of specimen other than nasopharyngeal swab, presence of viral mutation(s) within the areas targeted by this assay,  and inadequate number of viral copies (<131 copies/mL). A negative result must be combined with clinical observations, patient history, and epidemiological information. The expected result is Negative.  Fact Sheet for Patients:  https://www.moore.com/  Fact Sheet for Healthcare Providers:  https://www.young.biz/  This test is no t yet approved or cleared by the [place] FDA and  has been authorized for detection and/or diagnosis of SARS-CoV-2 by FDA under an Emergency Use Authorization (EUA). This EUA will remain  in effect (meaning this test can be used) for the duration of  the COVID-19 declaration under Section 564(b)(1) of the Act, 21 U.S.C. section 360bbb-3(b)(1), unless the authorization is terminated or revoked sooner.     Influenza A by PCR NEGATIVE NEGATIVE Final   Influenza B by PCR NEGATIVE NEGATIVE Final    Comment: (NOTE) The Xpert Xpress SARS-CoV-2/FLU/RSV assay is intended as an aid in  the diagnosis of influenza from Nasopharyngeal swab specimens and  should not be used as a sole basis for treatment. Nasal washings and  aspirates are unacceptable for Xpert Xpress SARS-CoV-2/FLU/RSV  testing.  Fact Sheet for Patients: https://www.moore.com/  Fact Sheet for Healthcare Providers: https://www.young.biz/  This test is not yet approved or cleared by the [place] FDA and  has been authorized for detection and/or diagnosis of SARS-CoV-2 by  FDA under an Emergency Use Authorization (EUA). This EUA will remain  in effect (meaning this test can be used) for the duration of the  Covid-19 declaration under Section 564(b)(1) of the Act, 21  U.S.C. section 360bbb-3(b)(1), unless the authorization is  terminated or revoked. Performed at [facility], [address] [place], [place] [address]      Labs: Basic Metabolic Panel: Recent Labs  Lab 12/15/19 1959  NA 134*  K 3.5  CL 102  CO2 23  GLUCOSE 117*  BUN 28*  CREATININE 1.15  CALCIUM 9.1   Liver Function Tests: No results for input(s): AST, ALT, ALKPHOS, BILITOT, PROT, ALBUMIN in the last 168 hours. No results for input(s): LIPASE, AMYLASE in the last 168 hours. No results for input(s): AMMONIA in the last 168 hours. CBC: Recent Labs  Lab 12/15/19 1959  WBC 9.7  HGB 17.5*  HCT 48.5  MCV 89.8  PLT 172   Cardiac Enzymes: No results for input(s): CKTOTAL, CKMB, CKMBINDEX, TROPONINI in the last 168 hours. BNP: Invalid input(s): POCBNP CBG: No results for input(s): GLUCAP in the last 168 hours.  Time coordinating discharge:  36  minutes  Signed:  Catarina Hartshorn, DO [facility] Pager: [phone] 12/16/2019, 7:30 AM

## 2019-12-16 NOTE — Progress Notes (Signed)
  Echocardiogram 2D Echocardiogram has been performed.  Justin [name] 12/16/2019, 9:15 AM

## 2019-12-16 NOTE — ED Notes (Signed)
Dr Tat at bedside 

## 2019-12-24 ENCOUNTER — Encounter (HOSPITAL_COMMUNITY)
Admission: RE | Admit: 2019-12-24 | Discharge: 2019-12-24 | Disposition: A | Discharge status: Home / Self Care | Payer: 59 | Source: Ambulatory Visit | Attending: Cardiology | Admitting: Cardiology

## 2019-12-24 ENCOUNTER — Other Ambulatory Visit: Payer: Self-pay

## 2019-12-24 ENCOUNTER — Encounter (HOSPITAL_BASED_OUTPATIENT_CLINIC_OR_DEPARTMENT_OTHER)
Admission: RE | Admit: 2019-12-24 | Discharge: 2019-12-24 | Disposition: A | Discharge status: Home / Self Care | Payer: 59 | Source: Ambulatory Visit | Attending: Cardiology | Admitting: Cardiology

## 2019-12-24 ENCOUNTER — Telehealth: Payer: Self-pay | Admitting: *Deleted

## 2019-12-24 DIAGNOSIS — I25118 Atherosclerotic heart disease of native coronary artery with other forms of angina pectoris: Secondary | ICD-10-CM | POA: Insufficient documentation

## 2019-12-24 LAB — NM MYOCAR MULTI W/SPECT W/WALL MOTION / EF
LV dias vol: 106 mL (ref 62–150)
LV sys vol: 45 mL
Peak HR: 105 {beats}/min
RATE: 0.32
Rest HR: 61 {beats}/min
SDS: 1
SRS: 12
SSS: 13
TID: 1.12

## 2019-12-24 MED ORDER — TECHNETIUM TC 99M TETROFOSMIN IV KIT
30.0000 | PACK | Freq: Once | INTRAVENOUS | Status: AC | PRN
  Administered 2019-12-24: 30.9 via INTRAVENOUS

## 2019-12-24 MED ORDER — METOPROLOL SUCCINATE ER 25 MG PO TB24: 12.5000 mg | ORAL_TABLET | Freq: Every day | ORAL | 1 refills | Status: DC

## 2019-12-24 MED ORDER — TECHNETIUM TC 99M TETROFOSMIN IV KIT
10.0000 | PACK | Freq: Once | INTRAVENOUS | Status: AC | PRN
  Administered 2019-12-24: 10.7 via INTRAVENOUS

## 2019-12-24 MED ORDER — REGADENOSON 0.4 MG/5ML IV SOLN
INTRAVENOUS | Status: AC
  Administered 2019-12-24: 0.4 mg via INTRAVENOUS
  Filled 2019-12-24: qty 5

## 2019-12-24 MED ORDER — SODIUM CHLORIDE FLUSH 0.9 % IV SOLN
INTRAVENOUS | Status: AC
  Administered 2019-12-24: 10 mL via INTRAVENOUS
  Filled 2019-12-24: qty 10

## 2019-12-24 NOTE — Telephone Encounter (Signed)
-----   Message from Jonelle Sidle, MD sent at 12/24/2019  3:38 PM EST ----- Results reviewed.  Stress testing shows mild ischemia/scar in the inferior/inferolateral distribution with normal LVEF.  This suggests he could have had some progression in CAD since previous assessment prior to valve surgery, although still relatively low risk findings.  Continue medical therapy, try Toprol-XL 12.5 mg daily and continue remainder of regimen.  We should see him back in 6 months to reevaluate symptoms.

## 2019-12-24 NOTE — Telephone Encounter (Signed)
Reports that he gave wrong information at last office visit about taking atorvastatin 20 mg. Reports he has not taken this in over 1 year. Reports that he has been breaking his 40 mg atorvastatin tablet in half daily. Advised to continue this dose and his medication profile will be updated accordingly. Reports he will be getting repeat lipid lab work with PCP next year. Verbalized understanding.

## 2019-12-24 NOTE — Telephone Encounter (Signed)
Patient informed and verbalized understanding of plan. Copy sent to PCP 

## 2020-01-07 ENCOUNTER — Other Ambulatory Visit: Payer: Self-pay | Admitting: *Deleted

## 2020-01-07 ENCOUNTER — Other Ambulatory Visit: Payer: Self-pay

## 2020-01-07 ENCOUNTER — Ambulatory Visit (HOSPITAL_COMMUNITY)
Admission: RE | Admit: 2020-01-07 | Discharge: 2020-01-07 | Disposition: A | Discharge status: Home / Self Care | Payer: 59 | Source: Ambulatory Visit | Attending: Urology | Admitting: Urology

## 2020-01-07 DIAGNOSIS — I10 Essential (primary) hypertension: Secondary | ICD-10-CM

## 2020-01-07 DIAGNOSIS — N453 Epididymo-orchitis: Secondary | ICD-10-CM | POA: Insufficient documentation

## 2020-01-07 DIAGNOSIS — N5089 Other specified disorders of the male genital organs: Secondary | ICD-10-CM | POA: Insufficient documentation

## 2020-01-07 MED ORDER — LISINOPRIL 20 MG PO TABS: 20.0000 mg | ORAL_TABLET | Freq: Every day | ORAL | 1 refills | Status: DC

## 2020-01-07 MED ORDER — CHLORTHALIDONE 25 MG PO TABS
25.0000 mg | ORAL_TABLET | Freq: Every day | ORAL | 1 refills | Status: DC
Start: 2020-01-07 — End: 2020-06-30

## 2020-01-21 ENCOUNTER — Other Ambulatory Visit: Payer: Self-pay

## 2020-01-21 ENCOUNTER — Encounter: Payer: Self-pay | Admitting: Urology

## 2020-01-21 ENCOUNTER — Ambulatory Visit (INDEPENDENT_AMBULATORY_CARE_PROVIDER_SITE_OTHER): Payer: 59 | Admitting: Urology

## 2020-01-21 VITALS — BP 121/80 | HR 71 | Temp 98.7°F | Ht 66.0 in | Wt 191.0 lb

## 2020-01-21 DIAGNOSIS — N453 Epididymo-orchitis: Secondary | ICD-10-CM | POA: Diagnosis not present

## 2020-01-21 LAB — URINALYSIS, ROUTINE W REFLEX MICROSCOPIC
Bilirubin, UA: NEGATIVE
Glucose, UA: NEGATIVE
Leukocytes,UA: NEGATIVE
Nitrite, UA: NEGATIVE
Protein,UA: NEGATIVE
RBC, UA: NEGATIVE
Specific Gravity, UA: 1.025 (ref 1.005–1.030)
Urobilinogen, Ur: 4 mg/dL — ABNORMAL HIGH (ref 0.2–1.0)
pH, UA: 5.5 (ref 5.0–7.5)

## 2020-01-21 NOTE — Progress Notes (Signed)
01/21/2020 2:14 PM   Justin [name] 1955-10-22 [id number]  Referring provider: Dettinger, Elige Radon, MD [address] [place],  [place] [address]  followup left epididymal mass  HPI: Justin [name] is a 64yo here for followup for a left epididymal mass. The mass is not palpable per patient. Scrotal US 11/29 showed resolution of mass which is consistent with epididymitis. No pain in his testis or epididymis. No other complaints   PMH: Past Medical History:  Diagnosis Date  . Bicuspid aortic valve 04/16/2011   Complicated by severe aortic regurgitation secondary to cusp prolapse, status post 28mm Edwards bioprosthesic AVR   . Essential hypertension     Surgical History: Past Surgical History:  Procedure Laterality Date  . ANAL FISSURE REPAIR    . AORTIC VALVE REPLACEMENT  04/16/2011   Procedure: AORTIC VALVE REPLACEMENT (AVR);  Surgeon: Purcell Nails, MD;  Location: [facility] OR;  Service: Open Heart Surgery;  Laterality: N/A;  aortic valve replacement  . Left arm surgery    . VASECTOMY      Home Medications:  Allergies as of 01/21/2020   No Known Allergies     Medication List       Accurate as of January 21, 2020  2:14 PM. If you have any questions, ask your nurse or doctor.        acetaminophen 500 MG tablet Commonly known as: TYLENOL Take 1,000 mg by mouth every 6 (six) hours as needed for pain.   aspirin 81 MG tablet Take 81 mg by mouth daily.   atorvastatin 40 MG tablet Commonly known as: LIPITOR Take 1 tablet (40 mg total) by mouth daily.   CENTRUM SILVER PO Take 1 tablet by mouth daily.   chlorthalidone 25 MG tablet Commonly known as: HYGROTON Take 1 tablet (25 mg total) by mouth daily.   FISH OIL CONCENTRATE PO Take 1 tablet by mouth daily.   lisinopril 20 MG tablet Commonly known as: ZESTRIL Take 1 tablet (20 mg total) by mouth daily.   metoprolol succinate 25 MG 24 hr tablet Commonly known as: Toprol XL Take 0.5 tablets (12.5 mg total) by mouth  daily.   PROBIOTIC DAILY PO Take 1 tablet by mouth daily.   pyridOXINE 100 MG tablet Commonly known as: VITAMIN B-6 Take 100 mg by mouth daily.   sildenafil 100 MG tablet Commonly known as: Viagra Take 0.5-1 tablets (50-100 mg total) by mouth daily as needed for erectile dysfunction.   vitamin B-12 100 MCG tablet Commonly known as: CYANOCOBALAMIN Take 100 mcg by mouth daily.   Vitamin D3 50 MCG (2000 UT) Tabs Take 1 tablet by mouth daily.       Allergies: No Known Allergies  Family History: Family History  Problem Relation Age of Onset  . Cancer Father 1  . Kidney cancer Father     Social History:  reports that he has never smoked. He has never used smokeless tobacco. He reports current alcohol use. He reports that he does not use drugs.  ROS: All other review of systems were reviewed and are negative except what is noted above in HPI  Physical Exam: BP 121/80   Pulse 71   Temp 98.7 F (37.1 C)   Ht 5\' 6"  (1.676 m)   Wt 191 lb (86.6 kg)   BMI 30.83 kg/m   Constitutional:  Alert and oriented, No acute distress. HEENT: [place] AT, moist mucus membranes.  Trachea midline, no masses. Cardiovascular: No clubbing, cyanosis, or edema. Respiratory: Normal  respiratory effort, no increased work of breathing. GI: Abdomen is soft, nontender, nondistended, no abdominal masses GU: No CVA tenderness.  Lymph: No cervical or inguinal lymphadenopathy. Skin: No rashes, bruises or suspicious lesions. Neurologic: Grossly intact, no focal deficits, moving all 4 extremities. Psychiatric: Normal mood and affect.  Laboratory Data: Lab Results  Component Value Date   WBC 5.1 12/16/2019   HGB 15.2 12/16/2019   HCT 41.9 12/16/2019   MCV 90.7 12/16/2019   PLT 137 (L) 12/16/2019    Lab Results  Component Value Date   CREATININE 0.97 12/16/2019    No results found for: PSA  No results found for: TESTOSTERONE  Lab Results  Component Value Date   HGBA1C 5.2 06/21/2016     Urinalysis    Component Value Date/Time   COLORURINE YELLOW 04/14/2011 1131   APPEARANCEUR CLEAR 04/14/2011 1131   LABSPEC 1.018 04/14/2011 1131   PHURINE 7.5 04/14/2011 1131   GLUCOSEU NEGATIVE 04/14/2011 1131   HGBUR NEGATIVE 04/14/2011 1131   BILIRUBINUR negative 10/19/2019 1422   KETONESUR NEGATIVE 04/14/2011 1131   PROTEINUR Negative 10/19/2019 1422   PROTEINUR NEGATIVE 04/14/2011 1131   UROBILINOGEN 0.2 10/19/2019 1422   UROBILINOGEN 0.2 04/14/2011 1131   NITRITE negative 10/19/2019 1422   NITRITE NEGATIVE 04/14/2011 1131   LEUKOCYTESUR Negative 10/19/2019 1422    No results found for: LABMICR, WBCUA, RBCUA, LABEPIT, MUCUS, BACTERIA  Pertinent Imaging: Scrotal US 11/29: Images reviewed and discussed with the patient No results found for this or any previous visit.  No results found for this or any previous visit.  No results found for this or any previous visit.  No results found for this or any previous visit.  No results found for this or any previous visit.  No results found for this or any previous visit.  No results found for this or any previous visit.  No results found for this or any previous visit.   Assessment & Plan:    1. Orchitis and epididymitis and left epididymal mass -resolved. RTC prn - Urinalysis, Routine w reflex microscopic   No follow-ups on file.  Wilkie Aye, MD  [facility]

## 2020-01-21 NOTE — Patient Instructions (Signed)
Epididymitis  Epididymitis is swelling (inflammation) or infection of the epididymis. The epididymis is a cord-like structure that is located along the top and back part of the testicle. It collects and stores sperm from the testicle. This condition can also cause pain and swelling of the testicle and scrotum. Symptoms usually start suddenly (acute epididymitis). Sometimes epididymitis starts gradually and lasts for a while (chronic epididymitis). This type may be harder to treat. What are the causes? In men ages 20-40, this condition is usually caused by a bacterial infection or a sexually transmitted disease (STD), such as:  Gonorrhea.  Chlamydia. In men 40 and older who do not have anal sex, this condition is usually caused by bacteria from a blockage or from abnormalities in the urinary system. These can result from:  Having a tube placed into the bladder (urinary catheter).  Having an enlarged or inflamed prostate gland.  Having recently had urinary tract surgery.  Having a problem with a backward flow of urine (retrograde). In men who have a condition that weakens the body's defense system (immune system), such as HIV, this condition can be caused by:  Other bacteria, including tuberculosis and syphilis.  Viruses.  Fungi. Sometimes this condition occurs without infection. This may happen because of trauma or repetitive activities such as sports. What increases the risk? You are more likely to develop this condition if you have:  Unprotected sex with more than one partner.  Anal sex.  Recently had surgery.  A urinary catheter.  Urinary problems.  A suppressed immune system. What are the signs or symptoms? This condition usually begins suddenly with chills, fever, and pain behind the scrotum and in the testicle. Other symptoms include:  Swelling of the scrotum, testicle, or both.  Pain when ejaculating or urinating.  Pain in the back or  abdomen.  Nausea.  Itching and discharge from the penis.  A frequent need to pass urine.  Redness, increased warmth, and tenderness of the scrotum. How is this diagnosed? Your health care provider can diagnose this condition based on your symptoms and medical history. Your health care provider will also do a physical exam to ask about your symptoms and check your scrotum and testicle for swelling, pain, and redness. You may also have other tests, including:  Examination of discharge from the penis.  Urine tests for infections, such as STDs.  Ultrasound test for blood flow and inflammation. Your health care provider may test you for other STDs, including HIV. How is this treated? Treatment for this condition depends on the cause. If your condition is caused by a bacterial infection, oral antibiotic medicine may be prescribed. If the bacterial infection has spread to your blood, you may need to receive IV antibiotics. For both bacterial and nonbacterial epididymitis, you may be treated with:  Rest.  Elevation of the scrotum.  Pain medicines.  Anti-inflammatory medicines. Surgery may be needed to treat:  Bacterial epididymitis that causes pus to build up in the scrotum (abscess).  Chronic epididymitis that has not responded to other treatments. Follow these instructions at home: Medicines  Take over-the-counter and prescription medicines only as told by your health care provider.  If you were prescribed an antibiotic medicine, take it as told by your health care provider. Do not stop taking the antibiotic even if your condition improves. Sexual activity  If your epididymitis was caused by an STD, avoid sexual activity until your treatment is complete.  Inform your sexual partner or partners if you test positive for   an STD. They may need to be treated. Do not engage in sexual activity with your partner or partners until their treatment is completed. Managing pain and  swelling   If directed, elevate your scrotum and apply ice. ? Put ice in a plastic bag. ? Place a small towel or pillow between your legs. ? Rest your scrotum on the pillow or towel. ? Place another towel between your skin and the plastic bag. ? Leave the ice on for 20 minutes, 2-3 times a day.  Try taking a sitz bath to help with discomfort. This is a warm water bath that is taken while you are sitting down. The water should only come up to your hips and should cover your buttocks. Do this 3-4 times per day or as told by your health care provider.  Keep your scrotum elevated and supported while resting. Ask your health care provider if you should wear a scrotal support, such as a jockstrap. Wear it as told by your health care provider. General instructions  Return to your normal activities as told by your health care provider. Ask your health care provider what activities are safe for you.  Drink enough fluid to keep your urine pale yellow.  Keep all follow-up visits as told by your health care provider. This is important. Contact a health care provider if:  You have a fever.  Your pain medicine is not helping.  Your pain is getting worse.  Your symptoms do not improve within 3 days. Summary  Epididymitis is swelling (inflammation) or infection of the epididymis. This condition can also cause pain and swelling of the testicle and scrotum.  Treatment for this condition depends on the cause. If your condition is caused by a bacterial infection, oral antibiotic medicine may be prescribed.  Inform your sexual partner or partners if you test positive for an STD. They may need to be treated. Do not engage in sexual activity with your partner or partners until their treatment is completed.  Contact a health care provider if your symptoms do not improve within 3 days. This information is not intended to replace advice given to you by your health care provider. Make sure you discuss any  questions you have with your health care provider. Document Revised: 11/28/2017 Document Reviewed: 11/29/2017 Elsevier Patient Education  2020 Elsevier Inc.  

## 2020-01-24 ENCOUNTER — Telehealth: Payer: Self-pay | Admitting: Pharmacist

## 2020-01-24 NOTE — Telephone Encounter (Signed)
I agree, will discuss this further in the next time he comes in for a visit.

## 2020-01-24 NOTE — Telephone Encounter (Signed)
Received a letter from pt insurance stating he was on meloxicam which is high risk in patients with CVD. I could not see documentation that patient was on this. I called and spoke with patient who states he is taking it. Was given by Dr. Louanne Skye for aches and pains (although I cant see anywhere in epic where it was ordered). I advised him of the risk of MI, stroke and increased BP with long term use. I recommended he use only as needed.

## 2020-02-13 ENCOUNTER — Ambulatory Visit (INDEPENDENT_AMBULATORY_CARE_PROVIDER_SITE_OTHER): Payer: 59 | Admitting: Family Medicine

## 2020-02-13 ENCOUNTER — Encounter: Payer: Self-pay | Admitting: Family Medicine

## 2020-02-13 DIAGNOSIS — M1712 Unilateral primary osteoarthritis, left knee: Secondary | ICD-10-CM

## 2020-02-13 MED ORDER — CELECOXIB 100 MG PO CAPS: 100.0000 mg | ORAL_CAPSULE | Freq: Two times a day (BID) | ORAL | 3 refills | Status: DC

## 2020-02-13 NOTE — Progress Notes (Signed)
Virtual Visit via telephone Note  I connected with Justin [name] on 02/13/20 at 1728 by telephone and verified that I am speaking with the correct person using two identifiers. Justin [name] is currently located at home and patient are currently with her during visit. The provider, Elige Radon Janie Capp, MD is located in their office at time of visit.  Call ended at 1739  I discussed the limitations, risks, security and privacy concerns of performing an evaluation and management service by telephone and the availability of in person appointments. I also discussed with the patient that there may be a patient responsible charge related to this service. The patient expressed understanding and agreed to proceed.   History and Present Illness: Patient was informed that he cannot take meloxicam because of his heart valve.  They do not want him to take the medicine anymore. They said he had a heart valve replacement did not want him on meloxicam any further.  No diagnosis found.  Outpatient Encounter Medications as of 02/13/2020  Medication Sig  . acetaminophen (TYLENOL) 500 MG tablet Take 1,000 mg by mouth every 6 (six) hours as needed for pain.   Marland Kitchen aspirin 81 MG tablet Take 81 mg by mouth daily.  Marland Kitchen atorvastatin (LIPITOR) 40 MG tablet Take 1 tablet (40 mg total) by mouth daily. (Patient not taking: Reported on 01/21/2020)  . chlorthalidone (HYGROTON) 25 MG tablet Take 1 tablet (25 mg total) by mouth daily.  . Cholecalciferol (VITAMIN D3) 2000 UNITS TABS Take 1 tablet by mouth daily.  Marland Kitchen lisinopril (ZESTRIL) 20 MG tablet Take 1 tablet (20 mg total) by mouth daily.  . meloxicam (MOBIC) 15 MG tablet Take 15 mg by mouth daily. Unsure of dose-pt was instructed to only take as needed  . metoprolol succinate (TOPROL XL) 25 MG 24 hr tablet Take 0.5 tablets (12.5 mg total) by mouth daily.  . Multiple Vitamins-Minerals (CENTRUM SILVER PO) Take 1 tablet by mouth daily.  . Omega-3 Fatty Acids (FISH OIL  CONCENTRATE PO) Take 1 tablet by mouth daily.  . Probiotic Product (PROBIOTIC DAILY PO) Take 1 tablet by mouth daily.  Marland Kitchen pyridOXINE (VITAMIN B-6) 100 MG tablet Take 100 mg by mouth daily.  . sildenafil (VIAGRA) 100 MG tablet Take 0.5-1 tablets (50-100 mg total) by mouth daily as needed for erectile dysfunction.  . vitamin B-12 (CYANOCOBALAMIN) 100 MCG tablet Take 100 mcg by mouth daily.   No facility-administered encounter medications on file as of 02/13/2020.    Review of Systems  Constitutional: Negative for chills and fever.  Eyes: Negative for visual disturbance.  Respiratory: Negative for shortness of breath and wheezing.   Cardiovascular: Negative for chest pain and leg swelling.  Musculoskeletal: Negative for back pain and gait problem.  Skin: Negative for rash.  Neurological: Negative for dizziness, weakness and light-headedness.  All other systems reviewed and are negative.   Observations/Objective: Patient sounds comfortable and in no acute distress  Assessment and Plan: Problem List Items Addressed This Visit   None   Visit Diagnoses    Osteoarthritis of left knee, unspecified osteoarthritis type    -  Primary   Relevant Medications   celecoxib (CELEBREX) 100 MG capsule       Follow up plan: No follow-ups on file.     I discussed the assessment and treatment plan with the patient. The patient was provided an opportunity to ask questions and all were answered. The patient agreed with the plan and demonstrated an understanding of  the instructions.   The patient was advised to call back or seek an in-person evaluation if the symptoms worsen or if the condition fails to improve as anticipated.  The above assessment and management plan was discussed with the patient. The patient verbalized understanding of and has agreed to the management plan. Patient is aware to call the clinic if symptoms persist or worsen. Patient is aware when to return to the clinic for a  follow-up visit. Patient educated on when it is appropriate to go to the emergency department.    I provided 11 minutes of non-face-to-face time during this encounter.    Nils Pyle, MD

## 2020-02-15 ENCOUNTER — Telehealth: Payer: Self-pay | Admitting: Family Medicine

## 2020-02-15 NOTE — Telephone Encounter (Signed)
Michelle-spouse called regarding patient being placed on Celebrax by PCP.  Wants to know if it is ok to take with his heart condition and medications.  Please call [phone].

## 2020-02-19 NOTE — Telephone Encounter (Signed)
Wife, Elon Jester advised that celebrex is also NSAID and has the same risk as mobic. Verbalized understanding.

## 2020-04-08 ENCOUNTER — Encounter: Payer: Self-pay | Admitting: Family Medicine

## 2020-04-08 ENCOUNTER — Other Ambulatory Visit: Payer: Self-pay

## 2020-04-08 ENCOUNTER — Ambulatory Visit: Payer: 59 | Admitting: Family Medicine

## 2020-04-08 VITALS — BP 112/78 | HR 72 | Ht 66.0 in | Wt 190.0 lb

## 2020-04-08 DIAGNOSIS — R252 Cramp and spasm: Secondary | ICD-10-CM | POA: Diagnosis not present

## 2020-04-08 DIAGNOSIS — I10 Essential (primary) hypertension: Secondary | ICD-10-CM | POA: Diagnosis not present

## 2020-04-08 DIAGNOSIS — E782 Mixed hyperlipidemia: Secondary | ICD-10-CM

## 2020-04-08 DIAGNOSIS — Z1211 Encounter for screening for malignant neoplasm of colon: Secondary | ICD-10-CM | POA: Diagnosis not present

## 2020-04-08 NOTE — Progress Notes (Signed)
BP 112/78   Pulse 72   Ht _0  (1.676 m)   Wt 190 lb (86.2 kg)   SpO2 98%   BMI 30.67 kg/m    Subjective:   Patient ID: Justin [name], male    DOB: 1955-11-16, 65 y.o.   MRN: [id number]  HPI: Justin [name] is a 65 y.o. male presenting on 04/08/2020 for Medical Management of Chronic Issues, Hypertension, and Leg Pain (cramping)   HPI Hypertension Patient is currently on chlorthalidone and lisinopril, and their blood pressure today is 112/78. Patient denies any lightheadedness or dizziness. Patient denies headaches, blurred vision, chest pains, shortness of breath, or weakness. Denies any side effects from medication and is content with current medication.   Hyperlipidemia Patient is coming in for recheck of his hyperlipidemia. The patient is currently taking fish oil. They deny any issues with myalgias or history of liver damage from it. They deny any focal numbness or weakness or chest pain.   Patient does get the occasional cramping in his legs and sometimes arms but mostly legs, he says often during the day and at night sometimes and at random times.  Relevant past medical, surgical, family and social history reviewed and updated as indicated. Interim medical history since our last visit reviewed. Allergies and medications reviewed and updated.  Review of Systems  Constitutional: Negative for chills and fever.  Eyes: Negative for visual disturbance.  Respiratory: Negative for shortness of breath and wheezing.   Cardiovascular: Negative for chest pain and leg swelling.  Musculoskeletal: Positive for myalgias. Negative for arthralgias, back pain and gait problem.  Skin: Negative for rash.  Neurological: Negative for dizziness, tremors, weakness, light-headedness and numbness.  All other systems reviewed and are negative.   Per HPI unless specifically indicated above   Allergies as of 04/08/2020   No Known Allergies     Medication List       Accurate as of April 08, 2020  9:12 AM. If you have any questions, ask your nurse or doctor.        STOP taking these medications   atorvastatin 40 MG tablet Commonly known as: LIPITOR Stopped by: Fransisca Kaufmann Annica Marinello, MD   metoprolol succinate 25 MG 24 hr tablet Commonly known as: Toprol XL Stopped by: Fransisca Kaufmann Kosisochukwu Goldberg, MD     TAKE these medications   acetaminophen 500 MG tablet Commonly known as: TYLENOL Take 1,000 mg by mouth every 6 (six) hours as needed for pain.   aspirin 81 MG tablet Take 81 mg by mouth daily.   celecoxib 100 MG capsule Commonly known as: CeleBREX Take 1 capsule (100 mg total) by mouth 2 (two) times daily.   CENTRUM SILVER PO Take 1 tablet by mouth daily.   chlorthalidone 25 MG tablet Commonly known as: HYGROTON Take 1 tablet (25 mg total) by mouth daily.   FISH OIL CONCENTRATE PO Take 1 tablet by mouth daily.   lisinopril 20 MG tablet Commonly known as: ZESTRIL Take 1 tablet (20 mg total) by mouth daily.   PROBIOTIC DAILY PO Take 1 tablet by mouth daily.   pyridOXINE 100 MG tablet Commonly known as: VITAMIN B-6 Take 100 mg by mouth daily.   sildenafil 100 MG tablet Commonly known as: Viagra Take 0.5-1 tablets (50-100 mg total) by mouth daily as needed for erectile dysfunction.   vitamin B-12 100 MCG tablet Commonly known as: CYANOCOBALAMIN Take 100 mcg by mouth daily.   Vitamin D3 50 MCG (2000 UT) Tabs Take  1 tablet by mouth daily.        Objective:   BP 112/78   Pulse 72   Ht _0  (1.676 m)   Wt 190 lb (86.2 kg)   SpO2 98%   BMI 30.67 kg/m   Wt Readings from Last 3 Encounters:  04/08/20 190 lb (86.2 kg)  01/21/20 191 lb (86.6 kg)  12/15/19 190 lb 14.7 oz (86.6 kg)    Physical Exam Vitals and nursing note reviewed.  Constitutional:      General: He is not in acute distress.    Appearance: He is well-developed and well-nourished. He is not diaphoretic.  Eyes:     General: No scleral icterus.    Extraocular Movements: EOM normal.      Conjunctiva/sclera: Conjunctivae normal.  Neck:     Thyroid: No thyromegaly.  Cardiovascular:     Rate and Rhythm: Normal rate and regular rhythm.     Pulses: Intact distal pulses.     Heart sounds: Normal heart sounds. No murmur heard.   Pulmonary:     Effort: Pulmonary effort is normal. No respiratory distress.     Breath sounds: Normal breath sounds. No wheezing.  Musculoskeletal:        General: No swelling, tenderness or edema. Normal range of motion.     Cervical back: Neck supple.  Lymphadenopathy:     Cervical: No cervical adenopathy.  Skin:    General: Skin is warm and dry.     Findings: No rash.  Neurological:     Mental Status: He is alert and oriented to person, place, and time.     Coordination: Coordination normal.  Psychiatric:        Mood and Affect: Mood and affect normal.        Behavior: Behavior normal.       Assessment & Plan:   Problem List Items Addressed This Visit      Cardiovascular and Mediastinum   Hypertension   Relevant Orders   CMP14+EGFR     Other   Mixed hyperlipidemia - Primary   Relevant Orders   Lipid panel    Other Visit Diagnoses    Colon cancer screening       Relevant Orders   Ambulatory referral to Gastroenterology   Leg cramps       Relevant Orders   Magnesium    Will check magnesium and potassium 4 cramping and recommended that he start taking some magnesium.  Chlorthalidone is likely cause but his blood pressures controlled so I do want to change right now.   Follow up plan: Return in about 6 months (around 10/09/2020), or if symptoms worsen or fail to improve, for Physical exam and hypertension and hyperlipidemia.  Counseling provided for all of the vaccine components No orders of the defined types were placed in this encounter.   Caryl Pina, MD [facility] 04/08/2020, 9:12 AM

## 2020-04-09 ENCOUNTER — Encounter (INDEPENDENT_AMBULATORY_CARE_PROVIDER_SITE_OTHER): Payer: Self-pay | Admitting: *Deleted

## 2020-04-09 LAB — CMP14+EGFR
ALT: 27 IU/L (ref 0–44)
AST: 29 IU/L (ref 0–40)
Albumin/Globulin Ratio: 1.6 (ref 1.2–2.2)
Albumin: 4.1 g/dL (ref 3.8–4.8)
Alkaline Phosphatase: 45 IU/L (ref 44–121)
BUN/Creatinine Ratio: 15 (ref 10–24)
BUN: 18 mg/dL (ref 8–27)
Bilirubin Total: 0.6 mg/dL (ref 0.0–1.2)
CO2: 24 mmol/L (ref 20–29)
Calcium: 9.7 mg/dL (ref 8.6–10.2)
Chloride: 101 mmol/L (ref 96–106)
Creatinine, Ser: 1.18 mg/dL (ref 0.76–1.27)
Globulin, Total: 2.5 g/dL (ref 1.5–4.5)
Glucose: 104 mg/dL — ABNORMAL HIGH (ref 65–99)
Potassium: 4.4 mmol/L (ref 3.5–5.2)
Sodium: 137 mmol/L (ref 134–144)
Total Protein: 6.6 g/dL (ref 6.0–8.5)
eGFR: 70 mL/min/{1.73_m2} (ref >59)

## 2020-04-09 LAB — LIPID PANEL
Chol/HDL Ratio: 3.3 ratio (ref 0.0–5.0)
Cholesterol, Total: 169 mg/dL (ref 100–199)
HDL: 51 mg/dL (ref >39)
LDL Chol Calc (NIH): 106 mg/dL — ABNORMAL HIGH (ref 0–99)
Triglycerides: 60 mg/dL (ref 0–149)
VLDL Cholesterol Cal: 12 mg/dL (ref 5–40)

## 2020-04-09 LAB — MAGNESIUM: Magnesium: 1.8 mg/dL (ref 1.6–2.3)

## 2020-05-21 ENCOUNTER — Other Ambulatory Visit (INDEPENDENT_AMBULATORY_CARE_PROVIDER_SITE_OTHER): Payer: Self-pay | Admitting: *Deleted

## 2020-05-22 ENCOUNTER — Other Ambulatory Visit (INDEPENDENT_AMBULATORY_CARE_PROVIDER_SITE_OTHER): Payer: Self-pay

## 2020-05-22 DIAGNOSIS — Z1211 Encounter for screening for malignant neoplasm of colon: Secondary | ICD-10-CM

## 2020-06-07 ENCOUNTER — Other Ambulatory Visit: Payer: Self-pay | Admitting: Family Medicine

## 2020-06-07 DIAGNOSIS — M1712 Unilateral primary osteoarthritis, left knee: Secondary | ICD-10-CM

## 2020-06-17 ENCOUNTER — Other Ambulatory Visit (INDEPENDENT_AMBULATORY_CARE_PROVIDER_SITE_OTHER): Payer: Self-pay | Admitting: *Deleted

## 2020-06-18 ENCOUNTER — Encounter (INDEPENDENT_AMBULATORY_CARE_PROVIDER_SITE_OTHER): Payer: Self-pay | Admitting: *Deleted

## 2020-06-18 ENCOUNTER — Telehealth (INDEPENDENT_AMBULATORY_CARE_PROVIDER_SITE_OTHER): Payer: Self-pay | Admitting: *Deleted

## 2020-06-18 MED ORDER — PEG 3350-KCL-NA BICARB-NACL 420 G PO SOLR: 4000.0000 mL | Freq: Once | ORAL | 0 refills | Status: AC

## 2020-06-18 NOTE — Telephone Encounter (Signed)
Patient needs trilyte 

## 2020-06-18 NOTE — Telephone Encounter (Signed)
done

## 2020-06-22 NOTE — Progress Notes (Signed)
Cardiology Office Note  Date: 06/23/2020   ID: Justin [name], DOB 07/28/1955, MRN [id number]  PCP:  Dettinger, Elige Radon, MD  Cardiologist:  Nona Dell, MD Electrophysiologist:  None   Chief Complaint  Patient presents with  . Cardiac follow-up    History of Present Illness: Justin [name] is a 65 y.o. male last seen in November 2021.  He is here for a follow-up visit.  He has retired, Naval architect more regularly, feels less stressed.  He does not describe any exertional fatigue or unusual shortness of breath at this point.  I reviewed his medications which are outlined below.  He reports compliance with therapy.  We have been managing him medically based on follow-up Myoview done in November 2021.  Past Medical History:  Diagnosis Date  . Bicuspid aortic valve 04/16/2011   Complicated by severe aortic regurgitation secondary to cusp prolapse, status post 34mm Edwards bioprosthesic AVR   . Essential hypertension     Past Surgical History:  Procedure Laterality Date  . ANAL FISSURE REPAIR    . AORTIC VALVE REPLACEMENT  04/16/2011   Procedure: AORTIC VALVE REPLACEMENT (AVR);  Surgeon: Purcell Nails, MD;  Location: [facility] OR;  Service: Open Heart Surgery;  Laterality: N/A;  aortic valve replacement  . Left arm surgery    . VASECTOMY      Current Outpatient Medications  Medication Sig Dispense Refill  . acetaminophen (TYLENOL) 500 MG tablet Take 1,000 mg by mouth every 6 (six) hours as needed for pain.     Marland Kitchen aspirin 81 MG tablet Take 81 mg by mouth daily.    . celecoxib (CELEBREX) 100 MG capsule TAKE 1 CAPSULE BY MOUTH TWICE A DAY 60 capsule 3  . chlorthalidone (HYGROTON) 25 MG tablet Take 1 tablet (25 mg total) by mouth daily. 90 tablet 1  . Cholecalciferol (VITAMIN D3) 2000 UNITS TABS Take 1 tablet by mouth daily.    Marland Kitchen lisinopril (ZESTRIL) 20 MG tablet Take 1 tablet (20 mg total) by mouth daily. 90 tablet 1  . Multiple Vitamins-Minerals (CENTRUM SILVER PO) Take 1  tablet by mouth daily.    . Omega-3 Fatty Acids (FISH OIL CONCENTRATE PO) Take 1 tablet by mouth daily.    . Potassium 99 MG TABS Take 1 tablet by mouth daily.    . Probiotic Product (PROBIOTIC DAILY PO) Take 1 tablet by mouth daily.    Marland Kitchen pyridOXINE (VITAMIN B-6) 100 MG tablet Take 100 mg by mouth daily.    . sildenafil (VIAGRA) 100 MG tablet Take 0.5-1 tablets (50-100 mg total) by mouth daily as needed for erectile dysfunction. 10 tablet 5  . vitamin B-12 (CYANOCOBALAMIN) 100 MCG tablet Take 100 mcg by mouth daily.     No current facility-administered medications for this visit.   Allergies:  Patient has no known allergies.   ROS: No palpitations or syncope.  Physical Exam: VS:  BP 115/82   Pulse 81   Ht 5\' 6"  (1.676 m)   Wt 189 lb 12.8 oz (86.1 kg)   SpO2 96%   BMI 30.63 kg/m , BMI Body mass index is 30.63 kg/m.  Wt Readings from Last 3 Encounters:  06/23/20 189 lb 12.8 oz (86.1 kg)  04/08/20 190 lb (86.2 kg)  01/21/20 191 lb (86.6 kg)    General: Patient appears comfortable at rest. HEENT: Conjunctiva and lids normal, wearing a mask. Neck: Supple, no elevated JVP or carotid bruits, no thyromegaly. Lungs: Clear to auscultation, nonlabored breathing at  rest. Cardiac: Regular rate and rhythm, no S3, 2/6 systolic murmur, no pericardial rub. Extremities: No pitting edema.  ECG:  An ECG dated 12/15/2019 was personally reviewed today and demonstrated:  Sinus rhythm with decreased R wave progression PVCs.  Nonspecific ST changes.  Recent Labwork: 12/16/2019: Hemoglobin 15.2; Platelets 137 04/08/2020: ALT 27; AST 29; BUN 18; Creatinine, Ser 1.18; Magnesium 1.8; Potassium 4.4; Sodium 137     Component Value Date/Time   CHOL 169 04/08/2020 0935   TRIG 60 04/08/2020 0935   HDL 51 04/08/2020 0935   CHOLHDL 3.3 04/08/2020 0935   CHOLHDL 2.6 04/17/2018 0855   LDLCALC 106 (H) 04/08/2020 0935   LDLCALC 62 04/17/2018 0855    Other Studies Reviewed Today:  Lexiscan Myoview  12/24/2019:  No diagnostic ST segment changes to indicate ischemia.  Medium size, mild intensity, partially reversible apical to basal inferior/inferolateral/inferoseptal defect suggestive of scar with mild peri-infarct ischemia.  This is a low risk study.  Nuclear stress EF: 58%.  Assessment and Plan:  1.  Bicuspid aortic valve with aortic stenosis and severe aortic regurgitation status post bioprosthetic AVR in 2013.  No significant change on examination.  Last echocardiogram was in 2020 showing normal prosthetic function with mean gradient 6 mmHg.  2.  RCA distribution ischemia by follow-up Myoview in November 2021.  Reports no active symptoms at this time on medical therapy we will continue with observation for now.  Continue aspirin, lisinopril, and Toprol-XL.  3.  Mixed hyperlipidemia, he remains on Lipitor.  Medication Adjustments/Labs and Tests Ordered: Current medicines are reviewed at length with the patient today.  Concerns regarding medicines are outlined above.   Tests Ordered: No orders of the defined types were placed in this encounter.   Medication Changes: No orders of the defined types were placed in this encounter.   Disposition:  Follow up 6 months.  Signed, Jonelle Sidle, MD, [facility] 06/23/2020 2:15 PM    [facility] at [facility] [address] [place], [place], [place] [address] Phone: [phone]; Fax: [phone]

## 2020-06-23 ENCOUNTER — Encounter: Payer: Self-pay | Admitting: Cardiology

## 2020-06-23 ENCOUNTER — Telehealth (INDEPENDENT_AMBULATORY_CARE_PROVIDER_SITE_OTHER): Payer: Self-pay | Admitting: *Deleted

## 2020-06-23 ENCOUNTER — Ambulatory Visit: Payer: 59 | Admitting: Cardiology

## 2020-06-23 VITALS — BP 115/82 | HR 81 | Ht 66.0 in | Wt 189.8 lb

## 2020-06-23 DIAGNOSIS — I25119 Atherosclerotic heart disease of native coronary artery with unspecified angina pectoris: Secondary | ICD-10-CM | POA: Diagnosis not present

## 2020-06-23 DIAGNOSIS — E782 Mixed hyperlipidemia: Secondary | ICD-10-CM

## 2020-06-23 DIAGNOSIS — Z953 Presence of xenogenic heart valve: Secondary | ICD-10-CM

## 2020-06-23 NOTE — Patient Instructions (Signed)
Medication Instructions:   Your physician recommends that you continue on your current medications as directed. Please refer to the Current Medication list given to you today.  Labwork:  none  Testing/Procedures:  none  Follow-Up:  Your physician recommends that you schedule a follow-up appointment in: 6 months. You will receive a reminder call or letter in the mail in about 4 months reminding you to call and schedule your appointment. If you don't receive this letter, please contact our office.  Any Other Special Instructions Will Be Listed Below (If Applicable).  If you need a refill on your cardiac medications before your next appointment, please call your pharmacy. 

## 2020-06-23 NOTE — Telephone Encounter (Signed)
Referring MD/PCP: dettinger  Procedure: tcs  Reason/Indication:  screening  Has patient had this procedure before?  Yes,  If so, when, by whom and where?    Is there a family history of colon cancer?  no  Who?  What age when diagnosed?    Is patient diabetic? If yes, Type 1 or Type 2   no      Does patient have prosthetic heart valve or mechanical valve?  no  Do you have a pacemaker/defibrillator?  no  Has patient ever had endocarditis/atrial fibrillation? no  Have you had a stroke/heart attack last 6 mths? no  Does patient use oxygen? no  Has patient had joint replacement within last 12 months?  no  Is patient constipated or do they take laxatives? no  Does patient have a history of alcohol/drug use?  no  Is patient on blood thinner such as Coumadin, Plavix and/or Aspirin? yes  Do you take medicine for weight loss?  no  For male patients,: do you still have your menstrual cycle? n/a  Medications: ce;ecoxib 100 mg daily, lisinopril 20 mg daily, chlorthalidone 25 mg daily, asa 81 mg daily, one a day vitamin daily  Allergies: nkda  Medication Adjustment per Dr Rehman/Dr Levon Hedger asa 2 days  Procedure date & time: 07/24/20

## 2020-06-25 ENCOUNTER — Other Ambulatory Visit: Payer: Self-pay | Admitting: Cardiology

## 2020-06-29 ENCOUNTER — Other Ambulatory Visit: Payer: Self-pay | Admitting: Cardiology

## 2020-07-03 ENCOUNTER — Other Ambulatory Visit: Payer: Self-pay | Admitting: Cardiology

## 2020-07-03 DIAGNOSIS — I10 Essential (primary) hypertension: Secondary | ICD-10-CM

## 2020-07-16 ENCOUNTER — Telehealth: Payer: Self-pay | Admitting: Family Medicine

## 2020-07-16 NOTE — Telephone Encounter (Signed)
  Prescription Request  07/16/2020  What is the name of the medication or equipment? sildenafil (VIAGRA) 100 MG tablet  Have you contacted your pharmacy to request a refill? (if applicable) no  Which pharmacy would you like this sent to? [facility]   Patient notified that their request is being sent to the clinical staff for review and that they should receive a response within 2 business days.

## 2020-07-16 NOTE — Telephone Encounter (Signed)
Pt has apt with Dettinger at 955. Can pt get enough till apt?

## 2020-07-16 NOTE — Telephone Encounter (Signed)
Patient has appointment on 9/1- wanting refill until his appointment Please advise

## 2020-07-17 ENCOUNTER — Other Ambulatory Visit: Payer: Self-pay

## 2020-07-17 MED ORDER — SILDENAFIL CITRATE 100 MG PO TABS: 50.0000 mg | ORAL_TABLET | Freq: Every day | ORAL | 3 refills | Status: DC | PRN

## 2020-07-22 ENCOUNTER — Other Ambulatory Visit (HOSPITAL_COMMUNITY): Payer: 59

## 2020-07-24 ENCOUNTER — Ambulatory Visit (HOSPITAL_COMMUNITY)
Admission: RE | Admit: 2020-07-24 | Discharge: 2020-07-24 | Disposition: A | Discharge status: Home / Self Care | Payer: 59 | Attending: Internal Medicine | Admitting: Internal Medicine

## 2020-07-24 ENCOUNTER — Other Ambulatory Visit: Payer: Self-pay

## 2020-07-24 ENCOUNTER — Encounter (HOSPITAL_COMMUNITY)
Admission: RE | Disposition: A | Discharge status: Home / Self Care | Payer: Self-pay | Source: Home / Self Care | Attending: Internal Medicine

## 2020-07-24 DIAGNOSIS — Z86010 Personal history of colonic polyps: Secondary | ICD-10-CM | POA: Diagnosis not present

## 2020-07-24 DIAGNOSIS — Z1211 Encounter for screening for malignant neoplasm of colon: Secondary | ICD-10-CM | POA: Insufficient documentation

## 2020-07-24 DIAGNOSIS — K6289 Other specified diseases of anus and rectum: Secondary | ICD-10-CM | POA: Diagnosis not present

## 2020-07-24 DIAGNOSIS — Z791 Long term (current) use of non-steroidal anti-inflammatories (NSAID): Secondary | ICD-10-CM | POA: Insufficient documentation

## 2020-07-24 DIAGNOSIS — Z79899 Other long term (current) drug therapy: Secondary | ICD-10-CM | POA: Diagnosis not present

## 2020-07-24 DIAGNOSIS — Z7982 Long term (current) use of aspirin: Secondary | ICD-10-CM | POA: Diagnosis not present

## 2020-07-24 DIAGNOSIS — Z952 Presence of prosthetic heart valve: Secondary | ICD-10-CM | POA: Insufficient documentation

## 2020-07-24 HISTORY — PX: COLONOSCOPY: SHX5424

## 2020-07-24 LAB — HM COLONOSCOPY

## 2020-07-24 SURGERY — COLONOSCOPY
Anesthesia: Moderate Sedation

## 2020-07-24 MED ORDER — STERILE WATER FOR IRRIGATION IR SOLN
Status: DC | PRN
  Administered 2020-07-24: 100 mL

## 2020-07-24 MED ORDER — MIDAZOLAM HCL 5 MG/5ML IJ SOLN
INTRAMUSCULAR | Status: DC | PRN
  Administered 2020-07-24: 2 mg via INTRAVENOUS
  Administered 2020-07-24: 1 mg via INTRAVENOUS

## 2020-07-24 MED ORDER — MEPERIDINE HCL 50 MG/ML IJ SOLN
INTRAMUSCULAR | Status: AC
  Filled 2020-07-24: qty 1

## 2020-07-24 MED ORDER — MEPERIDINE HCL 50 MG/ML IJ SOLN
INTRAMUSCULAR | Status: DC | PRN
  Administered 2020-07-24 (×2): 25 mg via INTRAVENOUS

## 2020-07-24 MED ORDER — MIDAZOLAM HCL 5 MG/5ML IJ SOLN
INTRAMUSCULAR | Status: AC
  Filled 2020-07-24: qty 10

## 2020-07-24 MED ORDER — SODIUM CHLORIDE 0.9 % IV SOLN
INTRAVENOUS | Status: DC
  Administered 2020-07-24: 1000 mL via INTRAVENOUS

## 2020-07-24 NOTE — H&P (Signed)
Justin [name] is an 65 y.o. male.   Chief Complaint: Patient is here for colonoscopy. HPI: Patient is 65 year old Caucasian male who has history of colonic polyp and is here for surveillance colonoscopy.  Last exam was about 6 years ago at [facility] in [facility].  This is his third colonoscopy.  He denies abdominal pain change in bowel habits or rectal bleeding.  Family history is negative for CRC. Last aspirin dose was 3 days ago.  Past Medical History:  Diagnosis Date   Bicuspid aortic valve 04/16/2011   Complicated by severe aortic regurgitation secondary to cusp prolapse, status post 23mm Edwards bioprosthesic AVR    Essential hypertension     Past Surgical History:  Procedure Laterality Date   ANAL FISSURE REPAIR     AORTIC VALVE REPLACEMENT  04/16/2011   Procedure: AORTIC VALVE REPLACEMENT (AVR);  Surgeon: Purcell Nails, MD;  Location: [facility] OR;  Service: Open Heart Surgery;  Laterality: N/A;  aortic valve replacement   Left arm surgery     VASECTOMY      Family History  Problem Relation Age of Onset   Cancer Father 73   Kidney cancer Father    Social History:  reports that he has never smoked. He has never used smokeless tobacco. He reports current alcohol use. He reports that he does not use drugs.  Allergies: No Known Allergies  Medications Prior to Admission  Medication Sig Dispense Refill   acetaminophen (TYLENOL) 500 MG tablet Take 1,000 mg by mouth every 6 (six) hours as needed for pain.      aspirin 81 MG tablet Take 81 mg by mouth daily.     celecoxib (CELEBREX) 100 MG capsule TAKE 1 CAPSULE BY MOUTH TWICE A DAY (Patient taking differently: Take 100 mg by mouth daily.) 60 capsule 3   chlorthalidone (HYGROTON) 25 MG tablet TAKE 1 TABLET BY MOUTH EVERY DAY (Patient taking differently: Take 25 mg by mouth daily.) 90 tablet 1   lisinopril (ZESTRIL) 20 MG tablet TAKE 1 TABLET BY MOUTH EVERY DAY (Patient taking differently: Take 20 mg by mouth daily.) 90 tablet 3    Multiple Vitamins-Minerals (CENTRUM SILVER PO) Take 1 tablet by mouth daily.     Omega-3 Fatty Acids (FISH OIL CONCENTRATE PO) Take 1,200 mg by mouth daily.     Potassium 99 MG TABS Take 99 mg by mouth daily.     pyridOXINE (VITAMIN B-6) 100 MG tablet Take 100 mg by mouth daily.     vitamin B-12 (CYANOCOBALAMIN) 1000 MCG tablet Take 1,000 mcg by mouth daily.     sildenafil (VIAGRA) 100 MG tablet Take 0.5-1 tablets (50-100 mg total) by mouth daily as needed for erectile dysfunction. 10 tablet 3    No results found for this or any previous visit (from the past 48 hour(s)). No results found.  Review of Systems  Blood pressure (!) 125/93, pulse 74, temperature 98.5 F (36.9 C), temperature source Oral, resp. rate 12, height 5\' 6"  (1.676 m), weight 81.6 kg, SpO2 96 %. Physical Exam HENT:     Mouth/Throat:     Mouth: Mucous membranes are moist.     Pharynx: Oropharynx is clear.  Eyes:     General: No scleral icterus.    Conjunctiva/sclera: Conjunctivae normal.  Cardiovascular:     Rate and Rhythm: Normal rate and regular rhythm.     Heart sounds: Normal heart sounds. No murmur heard.    Comments: Midsternal scar. Pulmonary:     Effort:  Pulmonary effort is normal.     Breath sounds: Normal breath sounds.  Abdominal:     General: There is no distension.     Palpations: Abdomen is soft.     Tenderness: There is no abdominal tenderness.  Musculoskeletal:        General: No swelling.     Cervical back: Neck supple.  Lymphadenopathy:     Cervical: No cervical adenopathy.  Skin:    General: Skin is warm and dry.  Neurological:     Mental Status: He is alert.     Assessment/Plan History of colonic adenoma. Surveillance colonoscopy.  Lionel December, MD 07/24/2020, 8:52 AM

## 2020-07-24 NOTE — Op Note (Signed)
[facility] Patient Name: Justin CroftKenneth [name] Procedure Date: 07/24/2020 8:34 AM MRN: [id number] Date of Birth: 1955-08-23 Attending MD: Lionel DecemberNajeeb Isabella Ida , MD CSN: [id number] Age: [age] Admit Type: Outpatient Procedure:                Colonoscopy Indications:              High risk colon cancer surveillance: Personal                            history of colonic polyps Providers:                Lionel DecemberNajeeb Joette Schmoker, MD, Crystal Page, Dyann Ruddleonya Wilson Referring MD:             Arville CareJoshua Dettinger, MD Medicines:                Meperidine 50 mg IV, Midazolam 3 mg IV Complications:            No immediate complications. Estimated Blood Loss:     Estimated blood loss: none. Procedure:                Pre-Anesthesia Assessment:                           - Prior to the procedure, a History and Physical                            was performed, and patient medications and                            allergies were reviewed. The patient's tolerance of                            previous anesthesia was also reviewed. The risks                            and benefits of the procedure and the sedation                            options and risks were discussed with the patient.                            All questions were answered, and informed consent                            was obtained. Prior Anticoagulants: The patient has                            taken no previous anticoagulant or antiplatelet                            agents except for aspirin and has taken no previous                            anticoagulant or antiplatelet agents except for  NSAID medication. ASA Grade Assessment: II - A                            patient with mild systemic disease. After reviewing                            the risks and benefits, the patient was deemed in                            satisfactory condition to undergo the procedure.                           After obtaining informed consent, the  colonoscope                            was passed under direct vision. Throughout the                            procedure, the patient's blood pressure, pulse, and                            oxygen saturations were monitored continuously. The                            CF-HQ190L ([id number]) scope was introduced through                            the anus and advanced to the the cecum, identified                            by appendiceal orifice and ileocecal valve. The                            colonoscopy was performed without difficulty. The                            patient tolerated the procedure well. The quality                            of the bowel preparation was good. The ileocecal                            valve, appendiceal orifice, and rectum were                            photographed. Scope In: 9:03:51 AM Scope Out: 9:16:23 AM Scope Withdrawal Time: 0 hours 9 minutes 0 seconds  Total Procedure Duration: 0 hours 12 minutes 32 seconds  Findings:      The perianal and digital rectal examinations were normal.      The colon (entire examined portion) appeared normal.      Anal papilla(e) were hypertrophied. Impression:               - The entire examined colon is normal.                           -  Anal papilla(e) were hypertrophied.                           - No specimens collected. Moderate Sedation:      Moderate (conscious) sedation was administered by the endoscopy nurse       and supervised by the endoscopist. The following parameters were       monitored: oxygen saturation, heart rate, blood pressure, CO2       capnography and response to care. Total physician intraservice time was       17 minutes. Recommendation:           - Patient has a contact number available for                            emergencies. The signs and symptoms of potential                            delayed complications were discussed with the                            patient. Return to  normal activities tomorrow.                            Written discharge instructions were provided to the                            patient.                           - Resume previous diet today.                           - Continue present medications.                           - Repeat colonoscopy in 7 years for surveillance. Procedure Code(s):        --- Professional ---                           [phone], Colonoscopy, flexible; diagnostic, including                            collection of specimen(s) by brushing or washing,                            when performed (separate procedure)                           G0500, Moderate sedation services provided by the                            same physician or other qualified health care                            professional performing a gastrointestinal  endoscopic service that sedation supports,                            requiring the presence of an independent trained                            observer to assist in the monitoring of the                            patient's level of consciousness and physiological                            status; initial 15 minutes of intra-service time;                            patient age 63 years or older (additional time may                            be reported with [id number], as appropriate) Diagnosis Code(s):        --- Professional ---                           Z86.010, Personal history of colonic polyps                           K62.89, Other specified diseases of anus and rectum CPT copyright 2019 [employer]. All rights reserved. The codes documented in this report are preliminary and upon coder review may  be revised to meet current compliance requirements. Lionel December, MD Lionel December, MD 07/24/2020 9:24:10 AM This report has been signed electronically. Number of Addenda: 0

## 2020-07-24 NOTE — Discharge Instructions (Addendum)
Resume usual medications and diet as before. No driving for 24 hours. Next exam in 7 years.

## 2020-07-29 ENCOUNTER — Encounter (INDEPENDENT_AMBULATORY_CARE_PROVIDER_SITE_OTHER): Payer: Self-pay | Admitting: *Deleted

## 2020-07-31 ENCOUNTER — Encounter (HOSPITAL_COMMUNITY): Payer: Self-pay | Admitting: Internal Medicine

## 2020-09-22 ENCOUNTER — Other Ambulatory Visit: Payer: Self-pay

## 2020-09-22 DIAGNOSIS — M1712 Unilateral primary osteoarthritis, left knee: Secondary | ICD-10-CM

## 2020-09-22 DIAGNOSIS — I10 Essential (primary) hypertension: Secondary | ICD-10-CM

## 2020-09-22 MED ORDER — CHLORTHALIDONE 25 MG PO TABS: 25.0000 mg | ORAL_TABLET | Freq: Every day | ORAL | 1 refills | Status: DC

## 2020-09-22 MED ORDER — LISINOPRIL 20 MG PO TABS: 20.0000 mg | ORAL_TABLET | Freq: Every day | ORAL | 3 refills | Status: DC

## 2020-09-22 MED ORDER — CELECOXIB 100 MG PO CAPS: 100.0000 mg | ORAL_CAPSULE | Freq: Two times a day (BID) | ORAL | 3 refills | Status: DC

## 2020-09-22 MED ORDER — SILDENAFIL CITRATE 100 MG PO TABS: 50.0000 mg | ORAL_TABLET | Freq: Every day | ORAL | 3 refills | Status: DC | PRN

## 2020-10-09 ENCOUNTER — Encounter: Payer: 59 | Admitting: Family Medicine

## 2020-11-17 ENCOUNTER — Ambulatory Visit (INDEPENDENT_AMBULATORY_CARE_PROVIDER_SITE_OTHER): Payer: 59 | Admitting: Family Medicine

## 2020-11-17 ENCOUNTER — Encounter: Payer: Self-pay | Admitting: Family Medicine

## 2020-11-17 ENCOUNTER — Other Ambulatory Visit: Payer: Self-pay

## 2020-11-17 VITALS — BP 127/89 | HR 70 | Ht 66.0 in | Wt 190.0 lb

## 2020-11-17 DIAGNOSIS — Z0001 Encounter for general adult medical examination with abnormal findings: Secondary | ICD-10-CM

## 2020-11-17 DIAGNOSIS — I10 Essential (primary) hypertension: Secondary | ICD-10-CM

## 2020-11-17 DIAGNOSIS — E782 Mixed hyperlipidemia: Secondary | ICD-10-CM

## 2020-11-17 DIAGNOSIS — Z125 Encounter for screening for malignant neoplasm of prostate: Secondary | ICD-10-CM

## 2020-11-17 DIAGNOSIS — Z23 Encounter for immunization: Secondary | ICD-10-CM | POA: Diagnosis not present

## 2020-11-17 DIAGNOSIS — M1712 Unilateral primary osteoarthritis, left knee: Secondary | ICD-10-CM | POA: Diagnosis not present

## 2020-11-17 DIAGNOSIS — Z0000 Encounter for general adult medical examination without abnormal findings: Secondary | ICD-10-CM

## 2020-11-17 MED ORDER — CHLORTHALIDONE 25 MG PO TABS: 25.0000 mg | ORAL_TABLET | Freq: Every day | ORAL | 3 refills | Status: DC

## 2020-11-17 MED ORDER — CELECOXIB 100 MG PO CAPS: 100.0000 mg | ORAL_CAPSULE | Freq: Two times a day (BID) | ORAL | 3 refills | Status: DC

## 2020-11-17 NOTE — Progress Notes (Signed)
BP 127/89   Pulse 70   Ht 5\' 6"  (1.676 m)   Wt 190 lb (86.2 kg)   SpO2 98%   BMI 30.67 kg/m    Subjective:   Patient ID: Justin [name], male    DOB: May 07, 1955, 65 y.o.   MRN: [id number]  HPI: Justin [name] is a 65 y.o. male presenting on 11/17/2020 for Medical Management of Chronic Issues (CPE) and Hyperlipidemia   HPI Adult well exam and physical Patient denies any chest pain, shortness of breath, headaches or vision issues, abdominal complaints, diarrhea, nausea, vomiting.  His only real complaint is he does get arthritis in his knees, specifically left knee worse than right.  Other than that he denies any major health issues.  He continues to follow with cardiology  Hypertension and CAD and history of bicuspid valve that was replaced Patient is coming in for recheck of his hyperlipidemia. The patient is currently taking lisinopril and chlorthalidone. They deny any issues with myalgias or history of liver damage from it. They deny any focal numbness or weakness or chest pain.   Hyperlipidemia Patient is coming in for recheck of his hyperlipidemia. The patient is currently taking fish oils. They deny any issues with myalgias or history of liver damage from it. They deny any focal numbness or weakness or chest pain.   Relevant past medical, surgical, family and social history reviewed and updated as indicated. Interim medical history since our last visit reviewed. Allergies and medications reviewed and updated.  Review of Systems  Constitutional:  Negative for chills and fever.  HENT:  Negative for ear pain and tinnitus.   Eyes:  Negative for pain and discharge.  Respiratory:  Negative for cough, shortness of breath and wheezing.   Cardiovascular:  Negative for chest pain, palpitations and leg swelling.  Gastrointestinal:  Negative for abdominal pain, blood in stool, constipation and diarrhea.  Genitourinary:  Negative for dysuria and hematuria.  Musculoskeletal:  Negative  for back pain, gait problem and myalgias.  Skin:  Negative for rash.  Neurological:  Negative for dizziness, weakness and headaches.  Psychiatric/Behavioral:  Negative for suicidal ideas.   All other systems reviewed and are negative.  Per HPI unless specifically indicated above   Allergies as of 11/17/2020   No Known Allergies      Medication List        Accurate as of November 17, 2020  3:12 PM. If you have any questions, ask your nurse or doctor.          acetaminophen 500 MG tablet Commonly known as: TYLENOL Take 1,000 mg by mouth every 6 (six) hours as needed for pain.   aspirin 81 MG tablet Take 81 mg by mouth daily.   celecoxib 100 MG capsule Commonly known as: CELEBREX Take 1 capsule (100 mg total) by mouth 2 (two) times daily.   CENTRUM SILVER PO Take 1 tablet by mouth daily.   chlorthalidone 25 MG tablet Commonly known as: HYGROTON Take 1 tablet (25 mg total) by mouth daily.   cholecalciferol 25 MCG (1000 UNIT) tablet Commonly known as: VITAMIN D3 Take 1,000 Units by mouth daily.   FISH OIL CONCENTRATE PO Take 1,200 mg by mouth daily.   lisinopril 20 MG tablet Commonly known as: ZESTRIL Take 1 tablet (20 mg total) by mouth daily.   Potassium 99 MG Tabs Take 99 mg by mouth daily.   PROBIOTIC-10 PO Take by mouth daily.   pyridOXINE 100 MG tablet Commonly known as:  VITAMIN B-6 Take 100 mg by mouth daily.   sildenafil 100 MG tablet Commonly known as: Viagra Take 0.5-1 tablets (50-100 mg total) by mouth daily as needed for erectile dysfunction.   vitamin B-12 1000 MCG tablet Commonly known as: CYANOCOBALAMIN Take 1,000 mcg by mouth daily.         Objective:   BP 127/89   Pulse 70   Ht $R'5\' 6"'qj$  (1.676 m)   Wt 190 lb (86.2 kg)   SpO2 98%   BMI 30.67 kg/m   Wt Readings from Last 3 Encounters:  11/17/20 190 lb (86.2 kg)  07/24/20 180 lb (81.6 kg)  06/23/20 189 lb 12.8 oz (86.1 kg)    Physical Exam Vitals reviewed.   Constitutional:      General: He is not in acute distress.    Appearance: He is well-developed. He is not diaphoretic.  HENT:     Right Ear: External ear normal.     Left Ear: External ear normal.     Nose: Nose normal.     Mouth/Throat:     Pharynx: No oropharyngeal exudate.  Eyes:     General: No scleral icterus.    Conjunctiva/sclera: Conjunctivae normal.  Neck:     Thyroid: No thyromegaly.  Cardiovascular:     Rate and Rhythm: Normal rate and regular rhythm.     Heart sounds: Normal heart sounds. No murmur heard. Pulmonary:     Effort: Pulmonary effort is normal. No respiratory distress.     Breath sounds: Normal breath sounds. No wheezing.  Abdominal:     General: Bowel sounds are normal. There is no distension.     Palpations: Abdomen is soft.     Tenderness: There is no abdominal tenderness. There is no guarding or rebound.  Musculoskeletal:        General: Normal range of motion.     Cervical back: Neck supple.  Lymphadenopathy:     Cervical: No cervical adenopathy.  Skin:    General: Skin is warm and dry.     Findings: No rash.  Neurological:     Mental Status: He is alert and oriented to person, place, and time.     Coordination: Coordination normal.  Psychiatric:        Behavior: Behavior normal.      Assessment & Plan:   Problem List Items Addressed This Visit       Cardiovascular and Mediastinum   Hypertension   Relevant Medications   chlorthalidone (HYGROTON) 25 MG tablet   Other Relevant Orders   CMP14+EGFR     Other   Mixed hyperlipidemia   Relevant Medications   chlorthalidone (HYGROTON) 25 MG tablet   Other Relevant Orders   Lipid panel   Other Visit Diagnoses     Well adult exam    -  Primary   Relevant Orders   CBC with Differential/Platelet   Lipid panel   CMP14+EGFR   PSA, total and free   Osteoarthritis of left knee, unspecified osteoarthritis type       Relevant Medications   celecoxib (CELEBREX) 100 MG capsule   Need  for shingles vaccine       Relevant Orders   Varicella-zoster vaccine IM (Shingrix) (Completed)   Need for immunization against influenza       Relevant Orders   Flu Vaccine QUAD 18mo+IM (Fluarix, Fluzone & Alfiuria Quad PF) (Completed)   Prostate cancer screening       Relevant Orders   PSA, total and free  Continue current medicine, no changes. Follow up plan: Return in about 1 year (around 11/17/2021), or if symptoms worsen or fail to improve, for Physical.  Counseling provided for all of the vaccine components Orders Placed This Encounter  Procedures   Varicella-zoster vaccine IM (Shingrix)   Flu Vaccine QUAD 78mo+IM (Fluarix, Fluzone & Alfiuria Quad PF)   CBC with Differential/Platelet   Lipid panel   CMP14+EGFR   PSA, total and free    Caryl Pina, MD [facility] 11/17/2020, 3:12 PM

## 2020-11-18 LAB — CMP14+EGFR
ALT: 32 IU/L (ref 0–44)
AST: 26 IU/L (ref 0–40)
Albumin/Globulin Ratio: 1.8 (ref 1.2–2.2)
Albumin: 4.2 g/dL (ref 3.8–4.8)
Alkaline Phosphatase: 42 IU/L — ABNORMAL LOW (ref 44–121)
BUN/Creatinine Ratio: 14 (ref 10–24)
BUN: 13 mg/dL (ref 8–27)
Bilirubin Total: 0.6 mg/dL (ref 0.0–1.2)
CO2: 25 mmol/L (ref 20–29)
Calcium: 9.9 mg/dL (ref 8.6–10.2)
Chloride: 97 mmol/L (ref 96–106)
Creatinine, Ser: 0.96 mg/dL (ref 0.76–1.27)
Globulin, Total: 2.4 g/dL (ref 1.5–4.5)
Glucose: 90 mg/dL (ref 70–99)
Potassium: 4.2 mmol/L (ref 3.5–5.2)
Sodium: 134 mmol/L (ref 134–144)
Total Protein: 6.6 g/dL (ref 6.0–8.5)
eGFR: 89 mL/min/{1.73_m2} (ref >59)

## 2020-11-18 LAB — LIPID PANEL
Chol/HDL Ratio: 3.2 ratio (ref 0.0–5.0)
Cholesterol, Total: 189 mg/dL (ref 100–199)
HDL: 60 mg/dL (ref >39)
LDL Chol Calc (NIH): 116 mg/dL — ABNORMAL HIGH (ref 0–99)
Triglycerides: 68 mg/dL (ref 0–149)
VLDL Cholesterol Cal: 13 mg/dL (ref 5–40)

## 2020-11-18 LAB — CBC WITH DIFFERENTIAL/PLATELET
Basophils Absolute: 0.1 10*3/uL (ref 0.0–0.2)
Basos: 1 %
EOS (ABSOLUTE): 0.2 10*3/uL (ref 0.0–0.4)
Eos: 3 %
Hematocrit: 47.3 % (ref 37.5–51.0)
Hemoglobin: 16.5 g/dL (ref 13.0–17.7)
Immature Grans (Abs): 0 10*3/uL (ref 0.0–0.1)
Immature Granulocytes: 0 %
Lymphocytes Absolute: 1.5 10*3/uL (ref 0.7–3.1)
Lymphs: 22 %
MCH: 31.3 pg (ref 26.6–33.0)
MCHC: 34.9 g/dL (ref 31.5–35.7)
MCV: 90 fL (ref 79–97)
Monocytes Absolute: 0.5 10*3/uL (ref 0.1–0.9)
Monocytes: 7 %
Neutrophils Absolute: 4.5 10*3/uL (ref 1.4–7.0)
Neutrophils: 67 %
Platelets: 190 10*3/uL (ref 150–450)
RBC: 5.27 x10E6/uL (ref 4.14–5.80)
RDW: 12.8 % (ref 11.6–15.4)
WBC: 6.8 10*3/uL (ref 3.4–10.8)

## 2020-11-18 LAB — PSA, TOTAL AND FREE
PSA, Free Pct: 16.9 %
PSA, Free: 0.22 ng/mL
Prostate Specific Ag, Serum: 1.3 ng/mL (ref 0.0–4.0)

## 2020-12-18 ENCOUNTER — Encounter: Payer: Self-pay | Admitting: Family Medicine

## 2020-12-18 DIAGNOSIS — M1712 Unilateral primary osteoarthritis, left knee: Secondary | ICD-10-CM

## 2021-01-13 ENCOUNTER — Ambulatory Visit (INDEPENDENT_AMBULATORY_CARE_PROVIDER_SITE_OTHER): Payer: 59

## 2021-01-13 ENCOUNTER — Encounter: Payer: Self-pay | Admitting: Family Medicine

## 2021-01-13 ENCOUNTER — Ambulatory Visit: Payer: 59 | Admitting: Family Medicine

## 2021-01-13 VITALS — BP 122/83 | HR 66 | Temp 97.7°F | Ht 66.0 in | Wt 198.0 lb

## 2021-01-13 DIAGNOSIS — I25810 Atherosclerosis of coronary artery bypass graft(s) without angina pectoris: Secondary | ICD-10-CM

## 2021-01-13 DIAGNOSIS — Z952 Presence of prosthetic heart valve: Secondary | ICD-10-CM

## 2021-01-13 DIAGNOSIS — Z01818 Encounter for other preprocedural examination: Secondary | ICD-10-CM

## 2021-01-13 DIAGNOSIS — M1712 Unilateral primary osteoarthritis, left knee: Secondary | ICD-10-CM

## 2021-01-13 LAB — URINALYSIS, ROUTINE W REFLEX MICROSCOPIC
Bilirubin, UA: NEGATIVE
Glucose, UA: NEGATIVE
Ketones, UA: NEGATIVE
Leukocytes,UA: NEGATIVE
Nitrite, UA: NEGATIVE
Protein,UA: NEGATIVE
RBC, UA: NEGATIVE
Specific Gravity, UA: 1.01 (ref 1.005–1.030)
Urobilinogen, Ur: 0.2 mg/dL (ref 0.2–1.0)
pH, UA: 6 (ref 5.0–7.5)

## 2021-01-13 LAB — BAYER DCA HB A1C WAIVED: HB A1C (BAYER DCA - WAIVED): 5.2 % (ref 4.8–5.6)

## 2021-01-13 NOTE — Progress Notes (Signed)
Subjective:  Patient ID: Justin [name], male    DOB: 03/10/1955, 65 y.o.   MRN: [id number]  Patient Care Team: Dettinger, Fransisca Kaufmann, MD as PCP - General (Family Medicine) Satira Sark, MD as PCP - Cardiology (Cardiology) de Stanford Scotland, MD (Inactive) as Referring Physician (Cardiology)   Chief Complaint:  surgical clearance  (Left knee replacement)   HPI: Justin [name] is a 65 y.o. male presenting on 01/13/2021 for surgical clearance  (Left knee replacement)   Patient presents today for preoperative evaluation and clearance for left total knee arthroplasty.  He is scheduled to have the surgery sometime in January pending clearance.  He has a history of hypertension, aortic valve replacement and CAD.  He has not had risk stratification by cardiology.  He denies any chest pain, shortness of breath, diaphoresis, dizziness, palpitations, leg swelling, PND, or orthopnea.  His only concern is left knee pain which causes difficulty walking at times.   Relevant past medical, surgical, family, and social history reviewed and updated as indicated.  Allergies and medications reviewed and updated. Data reviewed: Chart in Epic.   Past Medical History:  Diagnosis Date   Bicuspid aortic valve 33/29/5188   Complicated by severe aortic regurgitation secondary to cusp prolapse, status post 43mm Edwards bioprosthesic AVR    Essential hypertension     Past Surgical History:  Procedure Laterality Date   ANAL FISSURE REPAIR     AORTIC VALVE REPLACEMENT  04/16/2011   Procedure: AORTIC VALVE REPLACEMENT (AVR);  Surgeon: Rexene Alberts, MD;  Location: [facility];  Service: Open Heart Surgery;  Laterality: N/A;  aortic valve replacement   COLONOSCOPY N/A 07/24/2020   Procedure: COLONOSCOPY;  Surgeon: Rogene Houston, MD;  Location[facility];  Service: Endoscopy;  Laterality: N/A;  815   Left arm surgery     VASECTOMY      Social History   Socioeconomic History   Marital status: Married     Spouse name: Not on file   Number of children: 2   Years of education: Not on file   Highest education level: Not on file  Occupational History   Occupation: Dealer  Tobacco Use   Smoking status: Never   Smokeless tobacco: Never  Vaping Use   Vaping Use: Never used  Substance and Sexual Activity   Alcohol use: Yes    Comment: Occasionally--socially   Drug use: No   Sexual activity: Not on file  Other Topics Concern   Not on file  Social History Narrative   Not on file   Social Determinants of Health   Financial Resource Strain: Not on file  Food Insecurity: Not on file  Transportation Needs: Not on file  Physical Activity: Not on file  Stress: Not on file  Social Connections: Not on file  Intimate Partner Violence: Not on file    Outpatient Encounter Medications as of 01/13/2021  Medication Sig   acetaminophen (TYLENOL) 500 MG tablet Take 1,000 mg by mouth every 6 (six) hours as needed for pain.    aspirin 81 MG tablet Take 81 mg by mouth daily.   celecoxib (CELEBREX) 100 MG capsule Take 1 capsule (100 mg total) by mouth 2 (two) times daily.   chlorthalidone (HYGROTON) 25 MG tablet Take 1 tablet (25 mg total) by mouth daily.   cholecalciferol (VITAMIN D3) 25 MCG (1000 UNIT) tablet Take 1,000 Units by mouth daily.   lisinopril (ZESTRIL) 20 MG tablet Take 1 tablet (20  mg total) by mouth daily.   Multiple Vitamins-Minerals (CENTRUM SILVER PO) Take 1 tablet by mouth daily.   Omega-3 Fatty Acids (FISH OIL CONCENTRATE PO) Take 1,200 mg by mouth daily.   Potassium 99 MG TABS Take 99 mg by mouth daily.   Probiotic Product (PROBIOTIC-10 PO) Take by mouth daily.   pyridOXINE (VITAMIN B-6) 100 MG tablet Take 100 mg by mouth daily.   sildenafil (VIAGRA) 100 MG tablet Take 0.5-1 tablets (50-100 mg total) by mouth daily as needed for erectile dysfunction.   vitamin B-12 (CYANOCOBALAMIN) 1000 MCG tablet Take 1,000 mcg by mouth daily.   No facility-administered encounter  medications on file as of 01/13/2021.    No Known Allergies  Review of Systems  Constitutional:  Negative for activity change, appetite change, chills, diaphoresis, fatigue, fever and unexpected weight change.  HENT: Negative.    Eyes: Negative.  Negative for photophobia and visual disturbance.  Respiratory:  Negative for apnea, cough, choking, chest tightness, shortness of breath, wheezing and stridor.   Cardiovascular:  Negative for chest pain, palpitations and leg swelling.  Gastrointestinal:  Negative for abdominal pain, blood in stool, constipation, diarrhea, nausea and vomiting.  Endocrine: Negative.   Genitourinary:  Negative for decreased urine volume, difficulty urinating, dysuria, frequency and urgency.  Musculoskeletal:  Positive for arthralgias, gait problem and joint swelling. Negative for myalgias.  Skin: Negative.   Allergic/Immunologic: Negative.   Neurological:  Negative for dizziness, tremors, seizures, syncope, facial asymmetry, speech difficulty, weakness, light-headedness, numbness and headaches.  Hematological: Negative.   Psychiatric/Behavioral:  Negative for confusion, hallucinations, sleep disturbance and suicidal ideas.   All other systems reviewed and are negative.      Objective:  BP 122/83   Pulse 66   Temp 97.7 F (36.5 C)   Ht 5\' 6"  (1.676 m)   Wt 198 lb (89.8 kg)   SpO2 96%   BMI 31.96 kg/m    Wt Readings from Last 3 Encounters:  01/13/21 198 lb (89.8 kg)  11/17/20 190 lb (86.2 kg)  07/24/20 180 lb (81.6 kg)    Physical Exam Vitals and nursing note reviewed.  Constitutional:      General: He is not in acute distress.    Appearance: Normal appearance. He is obese. He is not ill-appearing, toxic-appearing or diaphoretic.  HENT:     Head: Normocephalic and atraumatic.     Nose: Nose normal.     Mouth/Throat:     Mouth: Mucous membranes are moist.     Pharynx: Oropharynx is clear.  Eyes:     Conjunctiva/sclera: Conjunctivae normal.      Pupils: Pupils are equal, round, and reactive to light.  Cardiovascular:     Rate and Rhythm: Regular rhythm. Bradycardia present.     Pulses: Normal pulses.     Heart sounds: Murmur heard.  Systolic (loudest at RUSB, AV click appreciated) murmur is present with a grade of 2/6.    No friction rub. No gallop.  Pulmonary:     Effort: Pulmonary effort is normal.     Breath sounds: Normal breath sounds.  Abdominal:     General: Bowel sounds are normal.     Palpations: Abdomen is soft.  Musculoskeletal:     Left upper leg: Normal.     Left knee: Decreased range of motion. Tenderness present.     Right lower leg: No edema.     Left lower leg: Normal. No edema.  Skin:    General: Skin is warm and dry.  Capillary Refill: Capillary refill takes less than 2 seconds.  Neurological:     General: No focal deficit present.     Mental Status: He is alert and oriented to person, place, and time.  Psychiatric:        Mood and Affect: Mood normal.        Behavior: Behavior normal.        Thought Content: Thought content normal.        Judgment: Judgment normal.    Results for orders placed or performed in visit on 01/13/21  Bayer DCA Hb A1c Waived  Result Value Ref Range   HB A1C (BAYER DCA - WAIVED) 5.2 4.8 - 5.6 %  Urinalysis, Routine w reflex microscopic  Result Value Ref Range   Specific Gravity, UA 1.010 1.005 - 1.030   pH, UA 6.0 5.0 - 7.5   Color, UA Yellow Yellow   Appearance Ur Clear Clear   Leukocytes,UA Negative Negative   Protein,UA Negative Negative/Trace   Glucose, UA Negative Negative   Ketones, UA Negative Negative   RBC, UA Negative Negative   Bilirubin, UA Negative Negative   Urobilinogen, Ur 0.2 0.2 - 1.0 mg/dL   Nitrite, UA Negative Negative     EKG: SB, 57, PR 170 ms, QT 392 ms, occasional PVCs, no acute ST-T changes, nonspecific T wave abnormalities. No significant changes from prior EKG.   Pertinent labs & imaging results that were available during my care  of the patient were reviewed by me and considered in my medical decision making.  Assessment & Plan:  Jolan was seen today for surgical clearance .  Diagnoses and all orders for this visit:  Primary osteoarthritis of left knee Preoperative examination S/P aortic valve replacement Coronary artery disease involving other coronary artery bypass graft without angina pectoris Labs pending, EKG without acute changes. Will need risk stratification from cardiology. From a medical standpoint, if labs are unremarkable, he would be considered an intermediate risk for an intermediate risk surgery. Needs cardiology clearance.  Revised Cardiac Risk Index for Pre-Operative Risk from [email or web address]  on 01/13/2021 RESULT SUMMARY: 1 points Class II Risk 6.0 % 30-day risk of death, MI, or cardiac arrest -     EKG 12-Lead -     CBC with Differential/Platelet -     BMP8+EGFR -     Protime-INR -     Albumin -     Bayer DCA Hb A1c Waived -     Urinalysis, Routine w reflex microscopic -     DG Chest 2 View; Future   Continue all other maintenance medications.  Follow up plan: As needed   Continue healthy lifestyle choices, including diet (rich in fruits, vegetables, and lean proteins, and low in salt and simple carbohydrates) and exercise (at least 30 minutes of moderate physical activity daily).  The above assessment and management plan was discussed with the patient. The patient verbalized understanding of and has agreed to the management plan. Patient is aware to call the clinic if they develop any new symptoms or if symptoms persist or worsen. Patient is aware when to return to the clinic for a follow-up visit. Patient educated on when it is appropriate to go to the emergency department.   Monia Pouch, FNP-C [facility] [phone]

## 2021-01-14 ENCOUNTER — Telehealth: Payer: Self-pay | Admitting: *Deleted

## 2021-01-14 LAB — CBC WITH DIFFERENTIAL/PLATELET
Basophils Absolute: 0.1 10*3/uL (ref 0.0–0.2)
Basos: 1 %
EOS (ABSOLUTE): 0.4 10*3/uL (ref 0.0–0.4)
Eos: 9 %
Hematocrit: 46 % (ref 37.5–51.0)
Hemoglobin: 16.5 g/dL (ref 13.0–17.7)
Immature Grans (Abs): 0 10*3/uL (ref 0.0–0.1)
Immature Granulocytes: 0 %
Lymphocytes Absolute: 1.3 10*3/uL (ref 0.7–3.1)
Lymphs: 29 %
MCH: 31.9 pg (ref 26.6–33.0)
MCHC: 35.9 g/dL — ABNORMAL HIGH (ref 31.5–35.7)
MCV: 89 fL (ref 79–97)
Monocytes Absolute: 0.4 10*3/uL (ref 0.1–0.9)
Monocytes: 9 %
Neutrophils Absolute: 2.4 10*3/uL (ref 1.4–7.0)
Neutrophils: 52 %
Platelets: 188 10*3/uL (ref 150–450)
RBC: 5.17 x10E6/uL (ref 4.14–5.80)
RDW: 12.8 % (ref 11.6–15.4)
WBC: 4.6 10*3/uL (ref 3.4–10.8)

## 2021-01-14 LAB — BMP8+EGFR
BUN/Creatinine Ratio: 13 (ref 10–24)
BUN: 13 mg/dL (ref 8–27)
CO2: 25 mmol/L (ref 20–29)
Calcium: 9.6 mg/dL (ref 8.6–10.2)
Chloride: 101 mmol/L (ref 96–106)
Creatinine, Ser: 1.04 mg/dL (ref 0.76–1.27)
Glucose: 102 mg/dL — ABNORMAL HIGH (ref 70–99)
Potassium: 3.9 mmol/L (ref 3.5–5.2)
Sodium: 137 mmol/L (ref 134–144)
eGFR: 81 mL/min/{1.73_m2} (ref >59)

## 2021-01-14 LAB — ALBUMIN: Albumin: 4.2 g/dL (ref 3.8–4.8)

## 2021-01-14 LAB — PROTIME-INR
INR: 1 (ref 0.9–1.2)
Prothrombin Time: 10.8 s (ref 9.1–12.0)

## 2021-01-14 NOTE — Telephone Encounter (Signed)
   [facility] Pre-operative Risk Assessment    Patient Name: DIONTE BLAUSTEIN  DOB: 1955/05/31 MRN: [id number]  HEARTCARE STAFF:  - IMPORTANT!!!!!! Under Visit Info/Reason for Call, type in Other and utilize the format Clearance MM/DD/YY or Clearance TBD. Do not use dashes or single digits. - Please review there is not already an duplicate clearance open for this procedure. - If request is for dental extraction, please clarify the # of teeth to be extracted. - If the patient is currently at the dentist's office, call Pre-Op Callback Staff (MA/nurse) to input urgent request.  - If the patient is not currently in the dentist office, please route to the Pre-Op pool.  Request for surgical clearance:  What type of surgery is being performed? Left total knee arthroplasty  When is this surgery scheduled? TBD  What type of clearance is required (medical clearance vs. Pharmacy clearance to hold med vs. Both)? Medical  Are there any medications that need to be held prior to surgery and how long? no  Practice name and name of physician performing surgery? [facility] Dr. Rod Can  What is the office phone number? [phone]   7.   What is the office fax number? [phone]  8.   Anesthesia type (None, local, MAC, general) ? Spinal   Marlou Sa 01/14/2021, 3:40 PM  _________________________________________________________________   (provider comments below)

## 2021-01-15 NOTE — Telephone Encounter (Signed)
   Name: Justin [name]  DOB: 02/02/1956  MRN: [id number]  Primary Cardiologist: Nona Dell, MD  Chart reviewed as part of pre-operative protocol coverage. Because of [name]'s past medical history and time since last visit, he will require a follow-up visit in order to better assess preoperative cardiovascular risk.  He has a history of bicuspid AVR s/p bioprosthetic AVR 2013 and essential HTN. Pre-op cath in 2013 showed 50-60% mid to distal long diffuse narrowing in the RCA. Otherwise no significant CAD. He did have an abnormal stress test in 12/2019 showing medium size, mild intensity, partially reversible apical to basal inferior/inferolateral/inferoseptal defect suggestive of scar with mild peri-infarct ischemia. EF was 58%. This has subsequently been managed medically. At last OV 06/2020, Dr. Diona Browner recommended f/u in 6 months with a recall placed for 12/2020. The patient says he did not realize he was supposed to follow up then. He reports the ortho team is looking at January or February to schedule his knee surgery.  Pre-op covering staff: - Please schedule appointment and call patient to inform them. - Please contact requesting surgeon's office via preferred method (i.e, phone, fax) to inform them of need for appointment prior to surgery.  Request does not state need to hold any meds at this time.   Laurann Montana, PA-C  01/15/2021, 1:26 PM

## 2021-01-16 NOTE — Telephone Encounter (Signed)
I am going to send a message to scheduling team for [facility] office to ask to please help in getting an appt for pre op clearance for the pt. See message from Ronie Spies, [facility] where pt tells her surgeon looking to do surgery sometime between Jan-Feb 2023.

## 2021-01-19 NOTE — Telephone Encounter (Signed)
Pt has been scheduled to see Jacolyn Reedy, [facility] 02/16/21 for pre op clearance. I will forward notes to [facility] for upcoming appt. I will send FYI to requesting office pt has appt 02/16/21.

## 2021-01-29 ENCOUNTER — Other Ambulatory Visit: Payer: Self-pay | Admitting: Family Medicine

## 2021-01-29 DIAGNOSIS — M1712 Unilateral primary osteoarthritis, left knee: Secondary | ICD-10-CM

## 2021-02-10 NOTE — Progress Notes (Addendum)
Cardiology Office Note    Date:  02/16/2021   ID:  Justin [name], DOB 10-01-1955, MRN [id number]   PCP:  Dettinger, Elige RadonJoshua A, MD   [facility]  Cardiologist:  Nona DellSamuel McDowell, MD   Advanced Practice Provider:  No care team member to display Electrophysiologist:  None   [phone]   Chief Complaint  Patient presents with   Pre-op Exam    History of Present Illness:  Justin [name] is a 66 y.o. male with history of bicuspid aortic valve with aortic stenosis and severe AI status post bioprosthetic AVR 2013, CAD preop cath in 2013 50 to 60% mid distal long diffuse RCA otherwise no significant disease.  Abnormal stress test 12/2019 with scar and mild peri-infarct ischemia in the RCA distribution EF 58% managed medically, hypertension, HLD.  Last saw Dr. Diona BrownerMcDowell 06/23/20 and doing well.  Patient on my schedule for preoperative clearance before undergoing left knee replacement by Dr. Samson FredericBrian Swinteck.  Patient denies chest pain, dyspnea, edema, dizziness. Gets tired more easily. Was pushing 80 lbs of dirt in a wheel barrow Saturday and after 6 loads had to take a rest. No chest pain, or shortness of breath, just tired. Golfs regularly.     Past Medical History:  Diagnosis Date   Bicuspid aortic valve 04/16/2011   Complicated by severe aortic regurgitation secondary to cusp prolapse, status post 27mm Edwards bioprosthesic AVR    Essential hypertension     Past Surgical History:  Procedure Laterality Date   ANAL FISSURE REPAIR     AORTIC VALVE REPLACEMENT  04/16/2011   Procedure: AORTIC VALVE REPLACEMENT (AVR);  Surgeon: Purcell Nailslarence H Owen, MD;  [facility];  Service: Open Heart Surgery;  Laterality: N/A;  aortic valve replacement   COLONOSCOPY N/A 07/24/2020   Procedure: COLONOSCOPY;  Surgeon: Malissa Hippoehman, Najeeb U, MD;  Location: [facility];  Service: Endoscopy;  Laterality: N/A;  815   Left arm surgery     VASECTOMY      Current Medications: Current  Meds  Medication Sig   acetaminophen (TYLENOL) 500 MG tablet Take 1,000 mg by mouth every 6 (six) hours as needed for pain.    aspirin 81 MG tablet Take 81 mg by mouth daily.   celecoxib (CELEBREX) 100 MG capsule TAKE 1 CAPSULE BY MOUTH TWICE DAILY   chlorthalidone (HYGROTON) 25 MG tablet Take 1 tablet (25 mg total) by mouth daily.   cholecalciferol (VITAMIN D3) 25 MCG (1000 UNIT) tablet Take 1,000 Units by mouth daily.   lisinopril (ZESTRIL) 20 MG tablet Take 1 tablet (20 mg total) by mouth daily.   Multiple Vitamins-Minerals (CENTRUM SILVER PO) Take 1 tablet by mouth daily.   Omega-3 Fatty Acids (FISH OIL CONCENTRATE PO) Take 1,200 mg by mouth daily.   Potassium 99 MG TABS Take 99 mg by mouth daily.   Probiotic Product (PROBIOTIC-10 PO) Take by mouth daily.   pyridOXINE (VITAMIN B-6) 100 MG tablet Take 100 mg by mouth daily.   sildenafil (VIAGRA) 100 MG tablet Take 0.5-1 tablets (50-100 mg total) by mouth daily as needed for erectile dysfunction.   vitamin B-12 (CYANOCOBALAMIN) 1000 MCG tablet Take 1,000 mcg by mouth daily.     Allergies:   Patient has no known allergies.   Social History   Socioeconomic History   Marital status: Married    Spouse name: Not on file   Number of children: 2   Years of education: Not on file   Highest education  level: Not on file  Occupational History   Occupation: Curator  Tobacco Use   Smoking status: Never   Smokeless tobacco: Never  Vaping Use   Vaping Use: Never used  Substance and Sexual Activity   Alcohol use: Yes    Comment: Occasionally--socially   Drug use: No   Sexual activity: Not on file  Other Topics Concern   Not on file  Social History Narrative   Not on file   Social Determinants of Health   Financial Resource Strain: Not on file  Food Insecurity: Not on file  Transportation Needs: Not on file  Physical Activity: Not on file  Stress: Not on file  Social Connections: Not on file     Family History:  The patient's   family history includes Cancer (age of onset: 38) in his father; Kidney cancer in his father.   ROS:   Please see the history of present illness.    ROS All other systems reviewed and are negative.   PHYSICAL EXAM:   VS:  BP 108/74    Pulse 84    Ht 5\' 6"  (1.676 m)    Wt 198 lb 9.6 oz (90.1 kg)    SpO2 97%    BMI 32.05 kg/m   Physical Exam  GEN: Obese, in no acute distress  Neck: no JVD, carotid bruits, or masses Cardiac:RRR; no murmurs, rubs, or gallops  Respiratory:  clear to auscultation bilaterally, normal work of breathing GI: soft, nontender, nondistended, + BS Ext: without cyanosis, clubbing, or edema, Good distal pulses bilaterally Neuro:  Alert and Oriented x 3 Psych: euthymic mood, full affect  Wt Readings from Last 3 Encounters:  02/16/21 198 lb 9.6 oz (90.1 kg)  01/13/21 198 lb (89.8 kg)  11/17/20 190 lb (86.2 kg)      Studies/Labs Reviewed:   EKG:  EKG is not  ordered today.   Recent Labs: 04/08/2020: Magnesium 1.8 11/17/2020: ALT 32 01/13/2021: BUN 13; Creatinine, Ser 1.04; Hemoglobin 16.5; Platelets 188; Potassium 3.9; Sodium 137   Lipid Panel    Component Value Date/Time   CHOL 189 11/17/2020 1524   TRIG 68 11/17/2020 1524   HDL 60 11/17/2020 1524   CHOLHDL 3.2 11/17/2020 1524   CHOLHDL 2.6 04/17/2018 0855   LDLCALC 116 (H) 11/17/2020 1524   LDLCALC 62 04/17/2018 0855    Additional studies/ records that were reviewed today include:  Lexiscan Myoview 12/24/2019: No diagnostic ST segment changes to indicate ischemia. Medium size, mild intensity, partially reversible apical to basal inferior/inferolateral/inferoseptal defect suggestive of scar with mild peri-infarct ischemia. This is a low risk study. Nuclear stress EF: 58%.     Risk Assessment/Calculations:         ASSESSMENT:    1. Preoperative clearance   2. S/P aortic valve replacement   3. Essential hypertension   4. Coronary artery disease involving native coronary artery of native  heart without angina pectoris   5. Hyperlipidemia, unspecified hyperlipidemia type      PLAN:  In order of problems listed above:  Preop clearance for left knee replacement Dr. 12/26/2019. Patient with known history of CAD and abnormal NST 12/2019 but asymptomatic with high level of activity/METs. He's at moderate risk for surgery based on known CAD but can proceed without further testing. May hold ASA prior to surgery as surgeons feel necessary and can resume after.  According to the Revised Cardiac Risk Index (RCRI), his Perioperative Risk of Major Cardiac Event is (%): 0.9  His Functional  Capacity in METs is: 8.97 according to the [facility] Activity Status Index (DASI).   Bicuspid AVR status post bioprosthetic AVR 2013-echo stable 12/2019, will repeat 12/2021  Hypertension-well controlled  CAD preop cath in 2013 50 to 60% mid distal long diffuse RCA otherwise no significant disease abnormal stress test 12/2019 scar with mild peri-infarct ischemia RCA distribution EF 58% managed medically because no active symptoms  Hyperlipidemia LDL 116 11/2020-has been off a statin for over 2 years-just never got it refilled. Resume lipitor 20 mg once daily, repeat FLP in 3 months.  Shared Decision Making/Informed Consent        Medication Adjustments/Labs and Tests Ordered: Current medicines are reviewed at length with the patient today.  Concerns regarding medicines are outlined above.  Medication changes, Labs and Tests ordered today are listed in the Patient Instructions below. There are no Patient Instructions on file for this visit.   Elson Clan, PA-C  02/16/2021 12:37 PM    [facility] [address] [place], [place], [place]  [address] Phone: [phone]; Fax: [phone]

## 2021-02-16 ENCOUNTER — Other Ambulatory Visit: Payer: Self-pay

## 2021-02-16 ENCOUNTER — Ambulatory Visit: Payer: 59 | Admitting: Physician Assistant

## 2021-02-16 ENCOUNTER — Encounter: Payer: Self-pay | Admitting: Physician Assistant

## 2021-02-16 VITALS — BP 108/74 | HR 84 | Ht 66.0 in | Wt 198.6 lb

## 2021-02-16 DIAGNOSIS — Z952 Presence of prosthetic heart valve: Secondary | ICD-10-CM

## 2021-02-16 DIAGNOSIS — I2510 Atherosclerotic heart disease of native coronary artery without angina pectoris: Secondary | ICD-10-CM | POA: Diagnosis not present

## 2021-02-16 DIAGNOSIS — I10 Essential (primary) hypertension: Secondary | ICD-10-CM | POA: Diagnosis not present

## 2021-02-16 DIAGNOSIS — E785 Hyperlipidemia, unspecified: Secondary | ICD-10-CM

## 2021-02-16 DIAGNOSIS — Z01818 Encounter for other preprocedural examination: Secondary | ICD-10-CM

## 2021-02-16 MED ORDER — ATORVASTATIN CALCIUM 20 MG PO TABS: 20.0000 mg | ORAL_TABLET | Freq: Every day | ORAL | 3 refills | Status: DC

## 2021-02-16 NOTE — Telephone Encounter (Signed)
Patient was seen and evaluated by Jacolyn Reedy PA-C 02/16/2021.  I will forward request to her for review and recommendations on aspirin.  I will remove patient from preoperative pool.  Thomasene Ripple. Giara Mcgaughey NP-C    02/16/2021, 1:25 PM [facility] [address] Office [phone] Fax [phone]

## 2021-02-16 NOTE — Telephone Encounter (Signed)
° °  [facility] called, she said they received clearance with Justin [name] but they need to have a documentation that it's ok for the patient to stop aspirin 1 week prior procedure.

## 2021-02-16 NOTE — Patient Instructions (Signed)
Medication Instructions:  START Atorvastatin 20 mg daily at dinner   Labwork: Fasting Lipids, LFT's in 3 months (early April)   Testing/Procedures: Your physician has requested that you have an echocardiogram [place]. Echocardiography is a painless test that uses sound waves to create images of your heart. It provides your doctor with information about the size and shape of your heart and how well your hearts chambers and valves are working. This procedure takes approximately one hour. There are no restrictions for this procedure.   Follow-Up: November with Dr.McDowell  Any Other Special Instructions Will Be Listed Below (If Applicable).  If you need a refill on your cardiac medications before your next appointment, please call your pharmacy.

## 2021-02-17 ENCOUNTER — Ambulatory Visit (INDEPENDENT_AMBULATORY_CARE_PROVIDER_SITE_OTHER): Payer: 59 | Admitting: *Deleted

## 2021-02-17 DIAGNOSIS — Z23 Encounter for immunization: Secondary | ICD-10-CM

## 2021-05-05 ENCOUNTER — Other Ambulatory Visit: Payer: Self-pay | Admitting: Family Medicine

## 2021-05-05 DIAGNOSIS — M1712 Unilateral primary osteoarthritis, left knee: Secondary | ICD-10-CM

## 2021-08-19 ENCOUNTER — Other Ambulatory Visit: Payer: Self-pay | Admitting: Family Medicine

## 2021-10-12 ENCOUNTER — Other Ambulatory Visit: Payer: Self-pay | Admitting: Family Medicine

## 2021-10-12 DIAGNOSIS — I10 Essential (primary) hypertension: Secondary | ICD-10-CM

## 2021-10-28 ENCOUNTER — Other Ambulatory Visit: Payer: Self-pay | Admitting: Family Medicine

## 2021-10-28 DIAGNOSIS — I10 Essential (primary) hypertension: Secondary | ICD-10-CM

## 2021-11-18 ENCOUNTER — Encounter: Payer: Self-pay | Admitting: Family Medicine

## 2021-11-18 ENCOUNTER — Ambulatory Visit (INDEPENDENT_AMBULATORY_CARE_PROVIDER_SITE_OTHER): Payer: 59 | Admitting: Family Medicine

## 2021-11-18 VITALS — BP 117/83 | HR 88 | Temp 98.0°F | Ht 66.0 in | Wt 193.0 lb

## 2021-11-18 DIAGNOSIS — E782 Mixed hyperlipidemia: Secondary | ICD-10-CM | POA: Diagnosis not present

## 2021-11-18 DIAGNOSIS — Z0000 Encounter for general adult medical examination without abnormal findings: Secondary | ICD-10-CM

## 2021-11-18 DIAGNOSIS — Z0001 Encounter for general adult medical examination with abnormal findings: Secondary | ICD-10-CM | POA: Diagnosis not present

## 2021-11-18 DIAGNOSIS — R350 Frequency of micturition: Secondary | ICD-10-CM

## 2021-11-18 DIAGNOSIS — I10 Essential (primary) hypertension: Secondary | ICD-10-CM | POA: Diagnosis not present

## 2021-11-18 DIAGNOSIS — I2510 Atherosclerotic heart disease of native coronary artery without angina pectoris: Secondary | ICD-10-CM

## 2021-11-18 DIAGNOSIS — Z23 Encounter for immunization: Secondary | ICD-10-CM

## 2021-11-18 DIAGNOSIS — N401 Enlarged prostate with lower urinary tract symptoms: Secondary | ICD-10-CM

## 2021-11-18 LAB — BAYER DCA HB A1C WAIVED: HB A1C (BAYER DCA - WAIVED): 5.4 % (ref 4.8–5.6)

## 2021-11-18 MED ORDER — LISINOPRIL 20 MG PO TABS: 20.0000 mg | ORAL_TABLET | Freq: Every day | ORAL | 3 refills | Status: DC

## 2021-11-18 MED ORDER — CHLORTHALIDONE 25 MG PO TABS: 25.0000 mg | ORAL_TABLET | Freq: Every day | ORAL | 3 refills | Status: DC

## 2021-11-18 MED ORDER — TAMSULOSIN HCL 0.4 MG PO CAPS: 0.4000 mg | ORAL_CAPSULE | Freq: Every day | ORAL | 3 refills | Status: DC

## 2021-11-18 MED ORDER — SILDENAFIL CITRATE 100 MG PO TABS: ORAL_TABLET | ORAL | 3 refills | Status: DC

## 2021-11-18 NOTE — Progress Notes (Signed)
BP 117/83   Pulse 88   Temp 98 F (36.7 C)   Ht 5' 6" (1.676 m)   Wt 193 lb (87.5 kg)   SpO2 98%   BMI 31.15 kg/m    Subjective:   Patient ID: Justin [name], male    DOB: 1955/12/07, 66 y.o.   MRN: [id number]  HPI: Justin [name] is a 66 y.o. male presenting on 11/18/2021 for Medical Management of Chronic Issues (CPE) and Hyperlipidemia   HPI Physical exam Patient denies any chest pain, shortness of breath, headaches or vision issues, abdominal complaints, diarrhea, nausea, vomiting, or joint issues.   Hypertension Patient is currently on lisinopril and chlorthalidone, and their blood pressure today is 117/83. Patient denies any lightheadedness or dizziness. Patient denies headaches, blurred vision, chest pains, shortness of breath, or weakness. Denies any side effects from medication and is content with current medication.   Hyperlipidemia and CAD Patient is coming in for recheck of his hyperlipidemia. The patient is currently taking atorvastatin. They deny any issues with myalgias or history of liver damage from it. They deny any focal numbness or weakness or chest pain.   Patient does complain of decreased urinary stream and increased urinary frequency and nocturia that is been an issue going on over the past few years but has worsened over the past year.  Relevant past medical, surgical, family and social history reviewed and updated as indicated. Interim medical history since our last visit reviewed. Allergies and medications reviewed and updated.  Review of Systems  Constitutional:  Negative for chills and fever.  HENT:  Negative for ear pain and tinnitus.   Eyes:  Negative for pain and discharge.  Respiratory:  Negative for cough, shortness of breath and wheezing.   Cardiovascular:  Negative for chest pain, palpitations and leg swelling.  Gastrointestinal:  Negative for abdominal pain, blood in stool, constipation and diarrhea.  Genitourinary:  Negative for dysuria  and hematuria.  Musculoskeletal:  Negative for back pain, gait problem and myalgias.  Skin:  Negative for rash.  Neurological:  Negative for dizziness, weakness and headaches.  Psychiatric/Behavioral:  Negative for suicidal ideas.   All other systems reviewed and are negative.   Per HPI unless specifically indicated above   Allergies as of 11/18/2021   No Known Allergies      Medication List        Accurate as of November 18, 2021  3:28 PM. If you have any questions, ask your nurse or doctor.          STOP taking these medications    acetaminophen 500 MG tablet Commonly known as: TYLENOL Stopped by: Fransisca Kaufmann Santos Hardwick, MD   celecoxib 100 MG capsule Commonly known as: CELEBREX Stopped by: Worthy Rancher, MD   cholecalciferol 25 MCG (1000 UNIT) tablet Commonly known as: VITAMIN D3 Stopped by: Fransisca Kaufmann Karime Scheuermann, MD   cyanocobalamin 1000 MCG tablet Commonly known as: VITAMIN B12 Stopped by: Worthy Rancher, MD   Potassium 99 MG Tabs Stopped by: Worthy Rancher, MD   PROBIOTIC-10 PO Stopped by: Fransisca Kaufmann Genise Strack, MD   pyridOXINE 100 MG tablet Commonly known as: VITAMIN B6 Stopped by: Fransisca Kaufmann Aleeah Greeno, MD       TAKE these medications    aspirin 81 MG tablet Take 81 mg by mouth daily.   atorvastatin 20 MG tablet Commonly known as: LIPITOR Take 1 tablet (20 mg total) by mouth daily.   CENTRUM SILVER PO Take 1 tablet by mouth  daily.   chlorthalidone 25 MG tablet Commonly known as: HYGROTON Take 1 tablet (25 mg total) by mouth daily.   FISH OIL CONCENTRATE PO Take 1,200 mg by mouth daily.   lisinopril 20 MG tablet Commonly known as: ZESTRIL Take 1 tablet (20 mg total) by mouth daily.   sildenafil 100 MG tablet Commonly known as: VIAGRA TAKE 0.5-1 TABLETS BY MOUTH DAILY AS NEEDED   tamsulosin 0.4 MG Caps capsule Commonly known as: FLOMAX Take 1 capsule (0.4 mg total) by mouth daily. Started by: Joshua A Dettinger, MD          Objective:   BP 117/83   Pulse 88   Temp 98 F (36.7 C)   Ht 5' 6" (1.676 m)   Wt 193 lb (87.5 kg)   SpO2 98%   BMI 31.15 kg/m   Wt Readings from Last 3 Encounters:  11/18/21 193 lb (87.5 kg)  02/16/21 198 lb 9.6 oz (90.1 kg)  01/13/21 198 lb (89.8 kg)    Physical Exam Vitals reviewed.  Constitutional:      General: He is not in acute distress.    Appearance: He is well-developed. He is not diaphoretic.  HENT:     Right Ear: External ear normal.     Left Ear: External ear normal.     Nose: Nose normal.     Mouth/Throat:     Pharynx: No oropharyngeal exudate.  Eyes:     General: No scleral icterus.    Conjunctiva/sclera: Conjunctivae normal.  Neck:     Thyroid: No thyromegaly.  Cardiovascular:     Rate and Rhythm: Normal rate and regular rhythm.     Heart sounds: Normal heart sounds. No murmur heard. Pulmonary:     Effort: Pulmonary effort is normal. No respiratory distress.     Breath sounds: Normal breath sounds. No wheezing.  Abdominal:     General: Bowel sounds are normal. There is no distension.     Palpations: Abdomen is soft.     Tenderness: There is no abdominal tenderness. There is no guarding or rebound.  Musculoskeletal:        General: No swelling. Normal range of motion.     Cervical back: Neck supple.  Lymphadenopathy:     Cervical: No cervical adenopathy.  Skin:    General: Skin is warm and dry.     Findings: No rash.  Neurological:     Mental Status: He is alert and oriented to person, place, and time.     Coordination: Coordination normal.  Psychiatric:        Behavior: Behavior normal.       Assessment & Plan:   Problem List Items Addressed This Visit       Cardiovascular and Mediastinum   Hypertension   Relevant Medications   chlorthalidone (HYGROTON) 25 MG tablet   lisinopril (ZESTRIL) 20 MG tablet   sildenafil (VIAGRA) 100 MG tablet   Other Relevant Orders   Bayer DCA Hb A1c Waived   CAD (coronary artery disease)    Relevant Medications   chlorthalidone (HYGROTON) 25 MG tablet   lisinopril (ZESTRIL) 20 MG tablet   sildenafil (VIAGRA) 100 MG tablet   Other Relevant Orders   Bayer DCA Hb A1c Waived     Other   Mixed hyperlipidemia   Relevant Medications   chlorthalidone (HYGROTON) 25 MG tablet   lisinopril (ZESTRIL) 20 MG tablet   sildenafil (VIAGRA) 100 MG tablet   Other Relevant Orders   Bayer DCA Hb   A1c Waived   Other Visit Diagnoses     Physical exam    -  Primary   Relevant Orders   CBC with Differential/Platelet   CMP14+EGFR   Lipid panel   Bayer DCA Hb A1c Waived   Essential hypertension       Relevant Medications   chlorthalidone (HYGROTON) 25 MG tablet   lisinopril (ZESTRIL) 20 MG tablet   sildenafil (VIAGRA) 100 MG tablet   Other Relevant Orders   Lipid panel   Benign prostatic hyperplasia with urinary frequency       Relevant Medications   tamsulosin (FLOMAX) 0.4 MG CAPS capsule   Other Relevant Orders   PSA, total and free     We will start Flomax form and see how he does, we will check PSA and other blood work today.  Follow up plan: Return in about 6 months (around 05/20/2022), or if symptoms worsen or fail to improve, for Hypertension and cholesterol recheck.  Counseling provided for all of the vaccine components Orders Placed This Encounter  Procedures   CBC with Differential/Platelet   CMP14+EGFR   Lipid panel   Bayer DCA Hb A1c Waived   PSA, total and free    Joshua Dettinger, MD [facility] 11/18/2021, 3:28 PM     

## 2021-11-19 LAB — LIPID PANEL
Chol/HDL Ratio: 2.1 ratio (ref 0.0–5.0)
Cholesterol, Total: 122 mg/dL (ref 100–199)
HDL: 59 mg/dL (ref >39)
LDL Chol Calc (NIH): 52 mg/dL (ref 0–99)
Triglycerides: 47 mg/dL (ref 0–149)
VLDL Cholesterol Cal: 11 mg/dL (ref 5–40)

## 2021-11-19 LAB — CBC WITH DIFFERENTIAL/PLATELET
Basophils Absolute: 0.1 10*3/uL (ref 0.0–0.2)
Basos: 1 %
EOS (ABSOLUTE): 0.3 10*3/uL (ref 0.0–0.4)
Eos: 5 %
Hematocrit: 45.6 % (ref 37.5–51.0)
Hemoglobin: 16.2 g/dL (ref 13.0–17.7)
Immature Grans (Abs): 0 10*3/uL (ref 0.0–0.1)
Immature Granulocytes: 0 %
Lymphocytes Absolute: 1.7 10*3/uL (ref 0.7–3.1)
Lymphs: 27 %
MCH: 31.8 pg (ref 26.6–33.0)
MCHC: 35.5 g/dL (ref 31.5–35.7)
MCV: 89 fL (ref 79–97)
Monocytes Absolute: 0.5 10*3/uL (ref 0.1–0.9)
Monocytes: 9 %
Neutrophils Absolute: 3.7 10*3/uL (ref 1.4–7.0)
Neutrophils: 58 %
Platelets: 187 10*3/uL (ref 150–450)
RBC: 5.1 x10E6/uL (ref 4.14–5.80)
RDW: 13.1 % (ref 11.6–15.4)
WBC: 6.3 10*3/uL (ref 3.4–10.8)

## 2021-11-19 LAB — CMP14+EGFR
ALT: 26 IU/L (ref 0–44)
AST: 22 IU/L (ref 0–40)
Albumin/Globulin Ratio: 1.8 (ref 1.2–2.2)
Albumin: 4.4 g/dL (ref 3.9–4.9)
Alkaline Phosphatase: 40 IU/L — ABNORMAL LOW (ref 44–121)
BUN/Creatinine Ratio: 14 (ref 10–24)
BUN: 15 mg/dL (ref 8–27)
Bilirubin Total: 0.8 mg/dL (ref 0.0–1.2)
CO2: 22 mmol/L (ref 20–29)
Calcium: 9.6 mg/dL (ref 8.6–10.2)
Chloride: 99 mmol/L (ref 96–106)
Creatinine, Ser: 1.07 mg/dL (ref 0.76–1.27)
Globulin, Total: 2.4 g/dL (ref 1.5–4.5)
Glucose: 85 mg/dL (ref 70–99)
Potassium: 3.7 mmol/L (ref 3.5–5.2)
Sodium: 139 mmol/L (ref 134–144)
Total Protein: 6.8 g/dL (ref 6.0–8.5)
eGFR: 77 mL/min/{1.73_m2} (ref >59)

## 2021-11-19 LAB — PSA, TOTAL AND FREE
PSA, Free Pct: 32.2 %
PSA, Free: 0.29 ng/mL
Prostate Specific Ag, Serum: 0.9 ng/mL (ref 0.0–4.0)

## 2021-12-10 ENCOUNTER — Ambulatory Visit (HOSPITAL_COMMUNITY)
Admission: RE | Admit: 2021-12-10 | Discharge: 2021-12-10 | Disposition: A | Discharge status: Home / Self Care | Payer: 59 | Source: Ambulatory Visit | Attending: Physician Assistant | Admitting: Physician Assistant

## 2021-12-10 DIAGNOSIS — Z952 Presence of prosthetic heart valve: Secondary | ICD-10-CM

## 2021-12-10 LAB — ECHOCARDIOGRAM COMPLETE
AR max vel: 1.81 cm2
AV Area VTI: 2.12 cm2
AV Area mean vel: 1.72 cm2
AV Mean grad: 7.5 mmHg
AV Peak grad: 13.2 mmHg
Ao pk vel: 1.82 m/s
Area-P 1/2: 4.6 cm2
S' Lateral: 2.6 cm

## 2021-12-10 NOTE — Progress Notes (Signed)
*  PRELIMINARY RESULTS* Echocardiogram 2D Echocardiogram has been performed.  Justin [name] 12/10/2021, 4:06 PM

## 2021-12-11 ENCOUNTER — Telehealth: Payer: Self-pay

## 2021-12-11 DIAGNOSIS — I77810 Thoracic aortic ectasia: Secondary | ICD-10-CM

## 2021-12-11 NOTE — Telephone Encounter (Signed)
Patients cell phone just rang. I spoke with wife,michelle, and discussed results. I placed order for CT. She will ask patient to clear messages from his cell phone.  BMET done 11/18/21 will be good thru 12/19/21   Please call patient to schedule CT

## 2021-12-11 NOTE — Telephone Encounter (Signed)
-----   Message from Imogene Burn, PA-C sent at 12/11/2021  9:03 AM EDT ----- Heart function is normal, aortic valve looks good, similar to last echo. Aortic root is dilated more than last echo. Should have CTA chest for aortic aneurysm.

## 2021-12-15 ENCOUNTER — Other Ambulatory Visit: Payer: Self-pay

## 2021-12-15 DIAGNOSIS — I2510 Atherosclerotic heart disease of native coronary artery without angina pectoris: Secondary | ICD-10-CM

## 2021-12-15 NOTE — Addendum Note (Signed)
Addended by: Barbarann Ehlers A on: 12/15/2021 09:27 AM   Modules accepted: Orders

## 2022-01-13 LAB — BASIC METABOLIC PANEL
BUN/Creatinine Ratio: 14 (ref 10–24)
BUN: 14 mg/dL (ref 8–27)
CO2: 23 mmol/L (ref 20–29)
Calcium: 9.6 mg/dL (ref 8.6–10.2)
Chloride: 101 mmol/L (ref 96–106)
Creatinine, Ser: 0.97 mg/dL (ref 0.76–1.27)
Glucose: 112 mg/dL — ABNORMAL HIGH (ref 70–99)
Potassium: 4 mmol/L (ref 3.5–5.2)
Sodium: 139 mmol/L (ref 134–144)
eGFR: 87 mL/min/{1.73_m2} (ref >59)

## 2022-01-25 ENCOUNTER — Ambulatory Visit (HOSPITAL_COMMUNITY)
Admission: RE | Admit: 2022-01-25 | Discharge: 2022-01-25 | Disposition: A | Discharge status: Home / Self Care | Payer: 59 | Source: Ambulatory Visit | Attending: Physician Assistant | Admitting: Physician Assistant

## 2022-01-25 DIAGNOSIS — I77810 Thoracic aortic ectasia: Secondary | ICD-10-CM | POA: Insufficient documentation

## 2022-01-25 MED ORDER — IOHEXOL 350 MG/ML SOLN
75.0000 mL | Freq: Once | INTRAVENOUS | Status: AC | PRN
  Administered 2022-01-25: 75 mL via INTRAVENOUS

## 2022-01-28 ENCOUNTER — Ambulatory Visit
Admission: EM | Admit: 2022-01-28 | Discharge: 2022-01-28 | Disposition: A | Discharge status: Home / Self Care | Payer: 59 | Attending: Nurse Practitioner | Admitting: Nurse Practitioner

## 2022-01-28 ENCOUNTER — Ambulatory Visit: Payer: 59

## 2022-01-28 ENCOUNTER — Ambulatory Visit (INDEPENDENT_AMBULATORY_CARE_PROVIDER_SITE_OTHER): Payer: 59

## 2022-01-28 DIAGNOSIS — R1010 Upper abdominal pain, unspecified: Secondary | ICD-10-CM | POA: Diagnosis not present

## 2022-01-28 DIAGNOSIS — K529 Noninfective gastroenteritis and colitis, unspecified: Secondary | ICD-10-CM | POA: Diagnosis not present

## 2022-01-28 MED ORDER — ALUM & MAG HYDROXIDE-SIMETH 200-200-20 MG/5ML PO SUSP
30.0000 mL | Freq: Once | ORAL | Status: AC
  Administered 2022-01-28: 30 mL via ORAL

## 2022-01-28 MED ORDER — AMOXICILLIN-POT CLAVULANATE 875-125 MG PO TABS: 1.0000 | ORAL_TABLET | Freq: Two times a day (BID) | ORAL | 0 refills | Status: AC

## 2022-01-28 NOTE — ED Provider Notes (Signed)
[facility]    CSN: [id number] Arrival date & time: 01/28/22  1034      History   Chief Complaint Chief Complaint  Patient presents with   Abdominal Pain    HPI Justin [name] is a 66 y.o. male.   Patient presents today with 4 days of abdominal pain.  Reports the pain is in his upper abdomen and is constant, however worse at night.  It is currently 5-6 out of 10 and describes it as a "constant" pain.  He is unable to characterize it.  Reports the pain is all the way across his upper abdomen.  The pain does not radiate to other part of his abdomen, down his leg or to his back.  Patient denies recent fever, unexplained weight loss, constipation, blood in his stool, new rash, dysuria/urinary frequency, hematuria, or frequent NSAID use.  He does take a baby aspirin daily for CAD/history of MI.  Patient endorses nausea without vomiting, dry heaving without vomiting, indigestion, burping frequently, and diarrhea that began today.  Reports yesterday, his stools were solid.  Reports he has had 2 episodes of diarrhea today that have been brown and watery like.  Patient denies recent antibiotic use.  Denies recent history of bowel obstruction.  Denies abdominal surgeries.    Past Medical History:  Diagnosis Date   Bicuspid aortic valve 04/16/2011   Complicated by severe aortic regurgitation secondary to cusp prolapse, status post 2mm Edwards bioprosthesic AVR    Essential hypertension     Patient Active Problem List   Diagnosis Date Noted   Mixed hyperlipidemia 12/15/2019   CAD (coronary artery disease) 05/06/2011   S/P aortic valve replacement 04/16/2011   Hypertension 05/04/2010   Hx of vasectomy 05/04/2010    Past Surgical History:  Procedure Laterality Date   ANAL FISSURE REPAIR     AORTIC VALVE REPLACEMENT  04/16/2011   Procedure: AORTIC VALVE REPLACEMENT (AVR);  Surgeon: Purcell Nails, MD;  Location: [facility];  Service: Open Heart Surgery;  Laterality: N/A;  aortic  valve replacement   COLONOSCOPY N/A 07/24/2020   Procedure: COLONOSCOPY;  Surgeon: Malissa Hippo, MD;  Location[facility];  Service: Endoscopy;  Laterality: N/A;  815   Left arm surgery     VASECTOMY         Home Medications    Prior to Admission medications   Medication Sig Start Date End Date Taking? Authorizing Provider  amoxicillin-clavulanate (AUGMENTIN) 875-125 MG tablet Take 1 tablet by mouth 2 (two) times daily for 7 days. 01/28/22 02/04/22 Yes Valentino Nose, NP  aspirin 81 MG tablet Take 81 mg by mouth daily.    [provider]  atorvastatin (LIPITOR) 20 MG tablet Take 1 tablet (20 mg total) by mouth daily. 02/16/21 05/17/21  Dyann Kief, PA-C  chlorthalidone (HYGROTON) 25 MG tablet Take 1 tablet (25 mg total) by mouth daily. 11/18/21   Dettinger, Elige Radon, MD  lisinopril (ZESTRIL) 20 MG tablet Take 1 tablet (20 mg total) by mouth daily. 11/18/21   Dettinger, Elige Radon, MD  Multiple Vitamins-Minerals (CENTRUM SILVER PO) Take 1 tablet by mouth daily.    [provider]  Omega-3 Fatty Acids (FISH OIL CONCENTRATE PO) Take 1,200 mg by mouth daily.    [provider]  sildenafil (VIAGRA) 100 MG tablet TAKE 0.5-1 TABLETS BY MOUTH DAILY AS NEEDED 11/18/21   Dettinger, Elige Radon, MD  tamsulosin (FLOMAX) 0.4 MG CAPS capsule Take 1 capsule (0.4 mg total) by  mouth daily. 11/18/21   Dettinger, Elige Radon, MD    Family History Family History  Problem Relation Age of Onset   Cancer Father 37   Kidney cancer Father     Social History Social History   Tobacco Use   Smoking status: Never   Smokeless tobacco: Never  Vaping Use   Vaping Use: Never used  Substance Use Topics   Alcohol use: Yes    Comment: Occasionally--socially   Drug use: No     Allergies   Patient has no known allergies.   Review of Systems Review of Systems Per HPI  Physical Exam Triage Vital Signs ED Triage Vitals  Enc Vitals Group     BP 01/28/22 1057 105/79      Pulse Rate 01/28/22 1057 97     Resp 01/28/22 1057 18     Temp 01/28/22 1057 98.2 F (36.8 C)     Temp Source 01/28/22 1057 Oral     SpO2 01/28/22 1057 96 %     Weight --      Height --      Head Circumference --      Peak Flow --      Pain Score 01/28/22 1101 5     Pain Loc --      Pain Edu? --      Excl. in GC? --    No data found.  Updated Vital Signs BP 105/79 (BP Location: Right Arm)   Pulse 97   Temp 98.2 F (36.8 C) (Oral)   Resp 18   SpO2 96%   Visual Acuity Right Eye Distance:   Left Eye Distance:   Bilateral Distance:    Right Eye Near:   Left Eye Near:    Bilateral Near:     Physical Exam Vitals and nursing note reviewed.  Constitutional:      General: He is not in acute distress.    Appearance: Normal appearance. He is not toxic-appearing.  HENT:     Head: Normocephalic and atraumatic.     Mouth/Throat:     Mouth: Mucous membranes are moist.     Pharynx: Oropharynx is clear. No posterior oropharyngeal erythema.  Eyes:     General: No scleral icterus.    Extraocular Movements: Extraocular movements intact.     Pupils: Pupils are equal, round, and reactive to light.  Cardiovascular:     Rate and Rhythm: Normal rate and regular rhythm.  Pulmonary:     Effort: Pulmonary effort is normal. No respiratory distress.     Breath sounds: Normal breath sounds. No wheezing, rhonchi or rales.  Abdominal:     General: Abdomen is flat. Bowel sounds are normal. There is no distension.     Palpations: Abdomen is soft.     Tenderness: There is abdominal tenderness in the right upper quadrant, epigastric area and left upper quadrant. There is no right CVA tenderness, left CVA tenderness, guarding or rebound. Negative signs include Murphy's sign and McBurney's sign.  Musculoskeletal:     Cervical back: Normal range of motion.  Lymphadenopathy:     Cervical: No cervical adenopathy.  Skin:    General: Skin is warm and dry.     Capillary Refill: Capillary refill  takes less than 2 seconds.     Coloration: Skin is not jaundiced or pale.     Findings: No erythema.  Neurological:     Mental Status: He is alert and oriented to person, place, and time.  Motor: No weakness.     Gait: Gait normal.  Psychiatric:        Behavior: Behavior is cooperative.      UC Treatments / Results  Labs (all labs ordered are listed, but only abnormal results are displayed) Labs Reviewed  COMPREHENSIVE METABOLIC PANEL  CBC  LIPASE    EKG   Radiology DG Abd 2 Views  Result Date: 01/28/2022 CLINICAL DATA:  upper abdominal pain x days EXAM: ABDOMEN - 2 VIEW COMPARISON:  None Available. FINDINGS: Multiple air-fluid levels within bowel loops in the right hemiabdomen without dilated bowel loops and with gas in more distal colon. No evidence of free air. No abnormal calcifications. No abnormal mass effect. Multilevel degenerative change in the spine. The visualized lung bases are clear. Median sternotomy. IMPRESSION: Multiple air-fluid levels within bowel loops in the right hemiabdomen without dilated bowel loops and with gas in more distal colon. Findings can be seen with enterocolitis. Electronically Signed   By: Feliberto Harts M.D.   On: 01/28/2022 12:26    Procedures Procedures (including critical care time)  Medications Ordered in UC Medications  alum & mag hydroxide-simeth (MAALOX/MYLANTA) 200-200-20 MG/5ML suspension 30 mL (30 mLs Oral Given 01/28/22 1245)    Initial Impression / Assessment and Plan / UC Course  I have reviewed the triage vital signs and the nursing notes.  Pertinent labs & imaging results that were available during my care of the patient were reviewed by me and considered in my medical decision making (see chart for details).   Patient is well-appearing, normotensive, afebrile, not tachycardic, not tachypneic, oxygenating well on room air.    Upper abdominal pain Enterocolitis Abdominal x-ray today shows multiple air-fluid  levels within bowel loops, however no dilated bowel loops Findings suggestive of enterocolitis Discussed with patient-after discussion of ER evaluation versus outpatient management, patient's to proceed with outpatient management attempt CBC, CMP obtained as well as lipase Start Augmentin, recommended bowel rest and only drink/week clear liquids for the next 48 hours If symptoms worsen or persist despite treatment, recommended strongly to go to the emergency room  The patient was given the opportunity to ask questions.  All questions answered to their satisfaction.  The patient is in agreement to this plan.    Final Clinical Impressions(s) / UC Diagnoses   Final diagnoses:  Upper abdominal pain  Enterocolitis     Discharge Instructions      The abdominal today shows possible infection called enterocolitis in your intestines.  We have given you Maalox/Mylanta today which helped with the indigestion.  You can pick this up from the pharmacy OTC and use per label instructions.    Please start the Augmentin to treat infection in your intestines.  Please also give your bowel rest and only drink/eat clear liquids for the next 48 hours.  Thereafter, you can start eating back easy to digest foods such as applesauce, mashed potatoes, pudding as long as your symptoms are getting better.  If your symptoms worsen despite this treatment, please go to the ER or call 911.  If your symptoms or not improved after 48 hours of treatment, please also go to the ER.  We have checked some blood work today and we will call you tomorrow with abnormal results.     ED Prescriptions     Medication Sig Dispense Auth. Provider   amoxicillin-clavulanate (AUGMENTIN) 875-125 MG tablet Take 1 tablet by mouth 2 (two) times daily for 7 days. 14 tablet Valentino Nose, NP  PDMP not reviewed this encounter.   Valentino NoseMartinez, Estiven Kohan A, NP 01/28/22 [phone]

## 2022-01-28 NOTE — ED Triage Notes (Signed)
Pt reports with upper abdominal pain x 3 days. Pt has been dry heaving, diarrhea, nausea. Took Pepto bismol and tylenol but no relief. It feels like knots are tying in his stomach. Loss of appetite

## 2022-01-28 NOTE — Discharge Instructions (Addendum)
The abdominal today shows possible infection called enterocolitis in your intestines.  We have given you Maalox/Mylanta today which helped with the indigestion.  You can pick this up from the pharmacy OTC and use per label instructions.    Please start the Augmentin to treat infection in your intestines.  Please also give your bowel rest and only drink/eat clear liquids for the next 48 hours.  Thereafter, you can start eating back easy to digest foods such as applesauce, mashed potatoes, pudding as long as your symptoms are getting better.  If your symptoms worsen despite this treatment, please go to the ER or call 911.  If your symptoms or not improved after 48 hours of treatment, please also go to the ER.  We have checked some blood work today and we will call you tomorrow with abnormal results.

## 2022-01-29 LAB — COMPREHENSIVE METABOLIC PANEL
ALT: 25 IU/L (ref 0–44)
AST: 22 IU/L (ref 0–40)
Albumin/Globulin Ratio: 1.9 (ref 1.2–2.2)
Albumin: 4.1 g/dL (ref 3.9–4.9)
Alkaline Phosphatase: 46 IU/L (ref 44–121)
BUN/Creatinine Ratio: 17 (ref 10–24)
BUN: 16 mg/dL (ref 8–27)
Bilirubin Total: 0.6 mg/dL (ref 0.0–1.2)
CO2: 20 mmol/L (ref 20–29)
Calcium: 8.7 mg/dL (ref 8.6–10.2)
Chloride: 98 mmol/L (ref 96–106)
Creatinine, Ser: 0.96 mg/dL (ref 0.76–1.27)
Globulin, Total: 2.2 g/dL (ref 1.5–4.5)
Glucose: 112 mg/dL — ABNORMAL HIGH (ref 70–99)
Potassium: 3.8 mmol/L (ref 3.5–5.2)
Sodium: 134 mmol/L (ref 134–144)
Total Protein: 6.3 g/dL (ref 6.0–8.5)
eGFR: 88 mL/min/{1.73_m2} (ref >59)

## 2022-01-29 LAB — CBC
Hematocrit: 48 % (ref 37.5–51.0)
Hemoglobin: 16.8 g/dL (ref 13.0–17.7)
MCH: 31.5 pg (ref 26.6–33.0)
MCHC: 35 g/dL (ref 31.5–35.7)
MCV: 90 fL (ref 79–97)
Platelets: 162 10*3/uL (ref 150–450)
RBC: 5.34 x10E6/uL (ref 4.14–5.80)
RDW: 13.1 % (ref 11.6–15.4)
WBC: 5.7 10*3/uL (ref 3.4–10.8)

## 2022-01-29 LAB — LIPASE: Lipase: 41 U/L (ref 13–78)

## 2022-02-17 ENCOUNTER — Other Ambulatory Visit: Payer: Self-pay | Admitting: Physician Assistant

## 2022-03-01 NOTE — Progress Notes (Unsigned)
Cardiology Office Note   Date:  03/01/2022   ID:  BRODEY BONN, DOB 1955-02-19, MRN [id number]  PCP:  Dettinger, Elige Radon, MD  Cardiologist:  Dr. Diona Browner    No chief complaint on file.     History of Present Illness: Justin [name] is a 67 y.o. male who presents for valvular heart disease.   Pt with history of bicuspid aortic valve with aortic stenosis and severe AI status post bioprosthetic AVR 2013, CAD preop cath in 2013 50 to 60% mid distal long diffuse RCA otherwise no significant disease. Abnormal stress test 12/2019 with scar and mild peri-infarct ischemia in the RCA distribution EF 58% managed medically, hypertension, HLD.   Last seen in the office 02/16/21 for preop eval. Was stable. Most recent echo 12/10/21 with increase of aortic root dilatation.  CTA of the chest was done with ascending thoracic aorta 4.0 X 4.1 will need annual follow-up. There is a 27 mm [facility] Ease pericardial valve present in the aortic position. Aortic valve mean gradient measures 7.5 mmHg.   Dx of enterocolitis in December, treated as outpt  Today we reviewed his echo and his CT scan and discussed aneurysm.  His BP is soft today we discussed changing lisinopril to BB.  Discussed no lifting over 50 lbs.  He usually does not anyway.   No chest pain-  on occ with heavy exertion he may have some SOB.  Resolves quickly.  Has not increased.  EF was normal on echo.   Past Medical History:  Diagnosis Date   Bicuspid aortic valve 04/16/2011   Complicated by severe aortic regurgitation secondary to cusp prolapse, status post 4mm Edwards bioprosthesic AVR    Essential hypertension     Past Surgical History:  Procedure Laterality Date   ANAL FISSURE REPAIR     AORTIC VALVE REPLACEMENT  04/16/2011   Procedure: AORTIC VALVE REPLACEMENT (AVR);  Surgeon: Purcell Nails, MD;  Location[facility];  Service: Open Heart Surgery;  Laterality: N/A;  aortic valve replacement   COLONOSCOPY N/A 07/24/2020    Procedure: COLONOSCOPY;  Surgeon: Malissa Hippo, MD;  Location[facility];  Service: Endoscopy;  Laterality: N/A;  815   Left arm surgery     VASECTOMY       Current Outpatient Medications  Medication Sig Dispense Refill   aspirin 81 MG tablet Take 81 mg by mouth daily.     atorvastatin (LIPITOR) 20 MG tablet TAKE ONE TABLET BY MOUTH EVERY DAY 90 tablet 3   chlorthalidone (HYGROTON) 25 MG tablet Take 1 tablet (25 mg total) by mouth daily. 90 tablet 3   lisinopril (ZESTRIL) 20 MG tablet Take 1 tablet (20 mg total) by mouth daily. 90 tablet 3   Multiple Vitamins-Minerals (CENTRUM SILVER PO) Take 1 tablet by mouth daily.     Omega-3 Fatty Acids (FISH OIL CONCENTRATE PO) Take 1,200 mg by mouth daily.     sildenafil (VIAGRA) 100 MG tablet TAKE 0.5-1 TABLETS BY MOUTH DAILY AS NEEDED 10 tablet 3   tamsulosin (FLOMAX) 0.4 MG CAPS capsule Take 1 capsule (0.4 mg total) by mouth daily. 90 capsule 3   No current facility-administered medications for this visit.    Allergies:   Patient has no known allergies.    Social History:  The patient  reports that he has never smoked. He has never used smokeless tobacco. He reports current alcohol use. He reports that he does not use drugs.   Family History:  The patient's family history includes Cancer (age of onset: 14) in his father; Kidney cancer in his father.    ROS:  General:no colds or fevers, no weight changes Skin:no rashes or ulcers HEENT:no blurred vision, no congestion CV:see HPI PUL:see HPI GI:no diarrhea constipation or melena, no indigestion GU:no hematuria, no dysuria MS:no joint pain, no claudication Neuro:no syncope, no lightheadedness Endo:no diabetes, no thyroid disease  Wt Readings from Last 3 Encounters:  11/18/21 193 lb (87.5 kg)  02/16/21 198 lb 9.6 oz (90.1 kg)  01/13/21 198 lb (89.8 kg)     PHYSICAL EXAM: VS:  There were no vitals taken for this visit. , BMI There is no height or weight on file to calculate  BMI. General:Pleasant affect, NAD Skin:Warm and dry, brisk capillary refill HEENT:normocephalic, sclera clear, mucus membranes moist Neck:supple, no JVD, no bruits  Heart:S1S2 RRR without murmur, gallup, rub or click Lungs:clear without rales, rhonchi, or wheezes YHC:WCBJ, non tender, + BS, do not palpate liver spleen or masses Ext:no lower ext edema, 2+ pedal pulses, 2+ radial pulses Neuro:alert and oriented, MAE, follows commands, + facial symmetry    EKG:  EKG is ordered today. The ekg ordered today demonstrates SR with PAC non specific T wave abnormality but no acute changes.  Stable.    Recent Labs: 01/28/2022: ALT 25; BUN 16; Creatinine, Ser 0.96; Hemoglobin 16.8; Platelets 162; Potassium 3.8; Sodium 134    Lipid Panel    Component Value Date/Time   CHOL 122 11/18/2021 1540   TRIG 47 11/18/2021 1540   HDL 59 11/18/2021 1540   CHOLHDL 2.1 11/18/2021 1540   CHOLHDL 2.6 04/17/2018 0855   LDLCALC 52 11/18/2021 1540   LDLCALC 62 04/17/2018 0855       Other studies Reviewed: Additional studies/ records that were reviewed today include: . Echo 12/10/21 IMPRESSIONS     1. Left ventricular ejection fraction, by estimation, is 60 to 65%. The  left ventricle has normal function. The left ventricle has no regional  wall motion abnormalities. There is moderate left ventricular hypertrophy  of the basal segment. Left  ventricular diastolic parameters were normal.   2. Right ventricular systolic function is normal. The right ventricular  size is normal. Tricuspid regurgitation signal is inadequate for assessing  PA pressure.   3. The mitral valve is grossly normal. Trivial mitral valve  regurgitation.   4. The aortic valve has been repaired/replaced. Aortic valve  regurgitation is not visualized. There is a 27 mm [facility] Ease  pericardial valve present in the aortic position. Aortic valve mean  gradient measures 7.5 mmHg.   5. Aortic dilatation noted. There is  moderate dilatation of the aortic  root, measuring 45 mm.   6. The inferior vena cava is normal in size with greater than 50%  respiratory variability, suggesting right atrial pressure of 3 mmHg.   Comparison(s): Prior images reviewed side by side. LVEF remains normal at  60-65%. Normal bioprothetic AVR with stable mean gradient and no  significant regurgitation. Moderately dilated aortic root.   FINDINGS   Left Ventricle: Left ventricular ejection fraction, by estimation, is 60  to 65%. The left ventricle has normal function. The left ventricle has no  regional wall motion abnormalities. The left ventricular internal cavity  size was normal in size. There is   moderate left ventricular hypertrophy of the basal segment. Left  ventricular diastolic parameters were normal.   Right Ventricle: The right ventricular size is normal. No increase in  right ventricular wall  thickness. Right ventricular systolic function is  normal. Tricuspid regurgitation signal is inadequate for assessing PA  pressure.   Left Atrium: Left atrial size was normal in size.   Right Atrium: Right atrial size was normal in size.   Pericardium: There is no evidence of pericardial effusion.   Mitral Valve: The mitral valve is grossly normal. Trivial mitral valve  regurgitation.   Tricuspid Valve: The tricuspid valve is grossly normal. Tricuspid valve  regurgitation is trivial.   Aortic Valve: The aortic valve has been repaired/replaced. Aortic valve  regurgitation is not visualized. Aortic valve mean gradient measures 7.5  mmHg. Aortic valve peak gradient measures 13.2 mmHg. Aortic valve area, by  VTI measures 2.12 cm. There is a  27 mm [facility] Ease pericardial valve present in the aortic position.   Pulmonic Valve: The pulmonic valve was grossly normal. Pulmonic valve  regurgitation is trivial.   Aorta: Aortic dilatation noted. There is moderate dilatation of the aortic  root, measuring 45 mm.    Venous: The inferior vena cava is normal in size with greater than 50%  respiratory variability, suggesting right atrial pressure of 3 mmHg.   IAS/Shunts: No atrial level shunt detected by color flow Doppler.   Chest CTA  IMPRESSION: Mild dilation of the ascending thoracic aorta to 4.0 x 4.1 cm status post bicuspid valve repair. Recommend annual imaging followup by CTA or MRA. This recommendation follows 2010 ACCF/AHA/AATS/ACR/ASA/SCA/SCAI/SIR/STS/SVM Guidelines for the Diagnosis and Management of Patients with Thoracic Aortic Disease. Circulation. 2010; 121: W409-B353. Aortic aneurysm NOS (ICD10-I71.9) .     ASSESSMENT AND PLAN:  1.  S/p aortic valve replacement for bicuspid aortic valve.stable on echo 27 mm [facility] Ease pericardial valve present in the aortic position. Aortic valve mean gradient measures 7.5 mmHg.  Follow up with Dr. Domenic Polite in 1 year with Echo prior.   2.  Ascending thoracic aortic dilatation at 4.0 , will follow up by echo in 1 year and then consider MRA.  Will change his lisinopril to toprol XL 25 mg and have him follow up for BP check in 2 weeks.  His HR is 87 today  3. HTN, borderline today. Stable  4.  CAD with RCA disease on cath in 2013 small vessels beyond stenosis.  5. HLD on statin and LDL in 10/23 was 52 HDL 59 T chol 122 continue statin.  Followed by PCP    Current medicines are reviewed with the patient today.  The patient Has no concerns regarding medicines.  The following changes have been made:  See above Labs/ tests ordered today include:see above  Disposition:   FU:  see above  Signed, Cecilie Kicks, NP  03/01/2022 10:34 AM    [address], [place], [place], [place] Phone: [phone]; Fax: [phone]

## 2022-03-03 ENCOUNTER — Encounter: Payer: Self-pay | Admitting: Cardiology

## 2022-03-03 ENCOUNTER — Ambulatory Visit: Payer: 59 | Attending: Cardiology | Admitting: Cardiology

## 2022-03-03 VITALS — BP 108/70 | HR 94 | Ht 66.0 in | Wt 202.8 lb

## 2022-03-03 DIAGNOSIS — Z952 Presence of prosthetic heart valve: Secondary | ICD-10-CM | POA: Diagnosis not present

## 2022-03-03 DIAGNOSIS — I77810 Thoracic aortic ectasia: Secondary | ICD-10-CM | POA: Diagnosis not present

## 2022-03-03 DIAGNOSIS — E785 Hyperlipidemia, unspecified: Secondary | ICD-10-CM

## 2022-03-03 DIAGNOSIS — I10 Essential (primary) hypertension: Secondary | ICD-10-CM

## 2022-03-03 DIAGNOSIS — I2510 Atherosclerotic heart disease of native coronary artery without angina pectoris: Secondary | ICD-10-CM | POA: Diagnosis not present

## 2022-03-03 MED ORDER — METOPROLOL SUCCINATE ER 25 MG PO TB24: 25.0000 mg | ORAL_TABLET | Freq: Every day | ORAL | 3 refills | Status: DC

## 2022-03-03 NOTE — Patient Instructions (Signed)
Medication Instructions:  Your physician has recommended you make the following change in your medication:   Stop Taking Lisinopril  Start Taking Toprol XL 25 mg Daily   *If you need a refill on your cardiac medications before your next appointment, please call your pharmacy*   Lab Work: NONE   If you have labs (blood work) drawn today and your tests are completely normal, you will receive your results only by: [place] (if you have MyChart) OR A paper copy in the mail If you have any lab test that is abnormal or we need to change your treatment, we will call you to review the results.   Testing/Procedures: Your physician has requested that you have an echocardiogram. Echocardiography is a painless test that uses sound waves to create images of your heart. It provides your doctor with information about the size and shape of your heart and how well your heart's chambers and valves are working. This procedure takes approximately one hour. There are no restrictions for this procedure. Please do NOT wear cologne, perfume, aftershave, or lotions (deodorant is allowed). Please arrive 15 minutes prior to your appointment time.    Follow-Up: At [facility], you and your health needs are our priority.  As part of our continuing mission to provide you with exceptional heart care, we have created designated Provider Care Teams.  These Care Teams include your primary Cardiologist (physician) and Advanced Practice Providers (APPs -  Physician Assistants and Nurse Practitioners) who all work together to provide you with the care you need, when you need it.  We recommend signing up for the patient portal called "MyChart".  Sign up information is provided on this After Visit Summary.  MyChart is used to connect with patients for Virtual Visits (Telemedicine).  Patients are able to view lab/test results, encounter notes, upcoming appointments, etc.  Non-urgent messages can be sent to your  provider as well.   To learn more about what you can do with MyChart, go to [email or web address].    Your next appointment:   1 year(s)  Provider:   Rozann Lesches, MD    Other Instructions Thank you for choosing [facility]!

## 2022-03-03 NOTE — Addendum Note (Signed)
Addended by: Levonne Hubert on: 03/03/2022 04:33 PM   Modules accepted: Orders

## 2022-03-06 ENCOUNTER — Encounter: Payer: Self-pay | Admitting: Emergency Medicine

## 2022-03-06 ENCOUNTER — Other Ambulatory Visit: Payer: Self-pay

## 2022-03-06 ENCOUNTER — Ambulatory Visit
Admission: EM | Admit: 2022-03-06 | Discharge: 2022-03-06 | Disposition: A | Discharge status: Home / Self Care | Payer: 59 | Attending: Family Medicine | Admitting: Family Medicine

## 2022-03-06 DIAGNOSIS — Z1152 Encounter for screening for COVID-19: Secondary | ICD-10-CM | POA: Insufficient documentation

## 2022-03-06 DIAGNOSIS — R059 Cough, unspecified: Secondary | ICD-10-CM | POA: Diagnosis present

## 2022-03-06 DIAGNOSIS — J069 Acute upper respiratory infection, unspecified: Secondary | ICD-10-CM

## 2022-03-06 MED ORDER — PROMETHAZINE-DM 6.25-15 MG/5ML PO SYRP: 5.0000 mL | ORAL_SOLUTION | Freq: Four times a day (QID) | ORAL | 0 refills | Status: DC | PRN

## 2022-03-06 MED ORDER — FLUTICASONE PROPIONATE 50 MCG/ACT NA SUSP: 1.0000 | Freq: Two times a day (BID) | NASAL | 2 refills | Status: DC

## 2022-03-06 NOTE — Discharge Instructions (Signed)
Your COVID test should be back tomorrow, somebody will be calling if your result is positive.  You may take Tylenol, Mucinex, saline sinus rinses, allergy medication and the medications that I have sent to help with your symptoms.

## 2022-03-06 NOTE — ED Triage Notes (Signed)
Pt reports cough, head congestion since Thursday night. Pt reports was cleaning out a dusty closet prior to symptoms starting. Denies any known fevers.

## 2022-03-07 LAB — SARS CORONAVIRUS 2 (TAT 6-24 HRS): SARS Coronavirus 2: NEGATIVE

## 2022-03-10 NOTE — ED Provider Notes (Signed)
[facility]    CSN: [id number] Arrival date & time: 03/06/22  1340      History   Chief Complaint Chief Complaint  Patient presents with   Cough    HPI Justin [name] is a 67 y.o. male.   Patient presenting today with several day history of hacking cough, nasal congestion, sinus pressure.  Denies fever, chills, chest pain, shortness of breath, abdominal pain, nausea vomiting or diarrhea.  History of seasonal allergies and states he was cleaning out a dusty closet prior to onset of symptoms.  Not trying anything over-the-counter for symptoms.  No known sick contacts recently.    Past Medical History:  Diagnosis Date   Bicuspid aortic valve 74/25/9563   Complicated by severe aortic regurgitation secondary to cusp prolapse, status post 70mm Edwards bioprosthesic AVR    Essential hypertension     Patient Active Problem List   Diagnosis Date Noted   Mixed hyperlipidemia 12/15/2019   CAD (coronary artery disease) 05/06/2011   S/P aortic valve replacement 04/16/2011   Hypertension 05/04/2010   Hx of vasectomy 05/04/2010    Past Surgical History:  Procedure Laterality Date   ANAL FISSURE REPAIR     AORTIC VALVE REPLACEMENT  04/16/2011   Procedure: AORTIC VALVE REPLACEMENT (AVR);  Surgeon: Rexene Alberts, MD;  Location: [facility];  Service: Open Heart Surgery;  Laterality: N/A;  aortic valve replacement   COLONOSCOPY N/A 07/24/2020   Procedure: COLONOSCOPY;  Surgeon: Rogene Houston, MD;  Location[facility];  Service: Endoscopy;  Laterality: N/A;  815   Left arm surgery     VASECTOMY         Home Medications    Prior to Admission medications   Medication Sig Start Date End Date Taking? Authorizing Provider  fluticasone (FLONASE) 50 MCG/ACT nasal spray Place 1 spray into both nostrils 2 (two) times daily. 03/06/22  Yes Volney American, PA-C  promethazine-dextromethorphan (PROMETHAZINE-DM) 6.25-15 MG/5ML syrup Take 5 mLs by mouth 4 (four) times daily  as needed. 03/06/22  Yes Volney American, PA-C  aspirin 81 MG tablet Take 81 mg by mouth daily.    [provider]  atorvastatin (LIPITOR) 20 MG tablet TAKE ONE TABLET BY MOUTH EVERY DAY 02/17/22   Imogene Burn, PA-C  chlorthalidone (HYGROTON) 25 MG tablet Take 1 tablet (25 mg total) by mouth daily. 11/18/21   Dettinger, Fransisca Kaufmann, MD  metoprolol succinate (TOPROL XL) 25 MG 24 hr tablet Take 1 tablet (25 mg total) by mouth daily. 03/03/22   Isaiah Serge, NP  Multiple Vitamins-Minerals (CENTRUM SILVER PO) Take 1 tablet by mouth daily.    [provider]  Omega-3 Fatty Acids (FISH OIL CONCENTRATE PO) Take 1,200 mg by mouth daily.    [provider]  sildenafil (VIAGRA) 100 MG tablet TAKE 0.5-1 TABLETS BY MOUTH DAILY AS NEEDED 11/18/21   Dettinger, Fransisca Kaufmann, MD  tamsulosin (FLOMAX) 0.4 MG CAPS capsule Take 1 capsule (0.4 mg total) by mouth daily. 11/18/21   Dettinger, Fransisca Kaufmann, MD    Family History Family History  Problem Relation Age of Onset   Cancer Father 46   Kidney cancer Father     Social History Social History   Tobacco Use   Smoking status: Never   Smokeless tobacco: Never  Vaping Use   Vaping Use: Never used  Substance Use Topics   Alcohol use: Not Currently    Comment: Occasionally--socially   Drug use: No  Allergies   Patient has no known allergies.   Review of Systems Review of Systems Per HPI  Physical Exam Triage Vital Signs ED Triage Vitals  Enc Vitals Group     BP 03/06/22 1421 131/83     Pulse Rate 03/06/22 1421 73     Resp 03/06/22 1421 20     Temp 03/06/22 1421 99.9 F (37.7 C)     Temp Source 03/06/22 1421 Oral     SpO2 03/06/22 1421 95 %     Weight --      Height --      Head Circumference --      Peak Flow --      Pain Score 03/06/22 1419 8     Pain Loc --      Pain Edu? --      Excl. in [place]? --    No data found.  Updated Vital Signs BP 131/83 (BP Location: Right Arm)   Pulse 73   Temp 99.9 F  (37.7 C) (Oral)   Resp 20   SpO2 95%   Visual Acuity Right Eye Distance:   Left Eye Distance:   Bilateral Distance:    Right Eye Near:   Left Eye Near:    Bilateral Near:     Physical Exam Vitals and nursing note reviewed.  Constitutional:      Appearance: He is well-developed.  HENT:     Head: Atraumatic.     Right Ear: External ear normal.     Left Ear: External ear normal.     Nose: Rhinorrhea present.     Mouth/Throat:     Pharynx: Posterior oropharyngeal erythema present. No oropharyngeal exudate.  Eyes:     Conjunctiva/sclera: Conjunctivae normal.     Pupils: Pupils are equal, round, and reactive to light.  Cardiovascular:     Rate and Rhythm: Normal rate and regular rhythm.     Heart sounds: Normal heart sounds.  Pulmonary:     Effort: Pulmonary effort is normal. No respiratory distress.     Breath sounds: No wheezing or rales.  Musculoskeletal:        General: Normal range of motion.     Cervical back: Normal range of motion and neck supple.  Lymphadenopathy:     Cervical: No cervical adenopathy.  Skin:    General: Skin is warm and dry.  Neurological:     Mental Status: He is alert and oriented to person, place, and time.  Psychiatric:        Behavior: Behavior normal.    UC Treatments / Results  Labs (all labs ordered are listed, but only abnormal results are displayed) Labs Reviewed  SARS CORONAVIRUS 2 (TAT 6-24 HRS)    EKG   Radiology No results found.  Procedures Procedures (including critical care time)  Medications Ordered in UC Medications - No data to display  Initial Impression / Assessment and Plan / UC Course  I have reviewed the triage vital signs and the nursing notes.  Pertinent labs & imaging results that were available during my care of the patient were reviewed by me and considered in my medical decision making (see chart for details).     Vitals and exam reassuring today, suggestive of viral versus allergic symptoms.   COVID testing pending, treat with supportive over-the-counter medications, home care, Phenergan DM, Flonase.  Discussed return precautions for worsening symptoms.  Final Clinical Impressions(s) / UC Diagnoses   Final diagnoses:  Viral URI with cough  Discharge Instructions      Your COVID test should be back tomorrow, somebody will be calling if your result is positive.  You may take Tylenol, Mucinex, saline sinus rinses, allergy medication and the medications that I have sent to help with your symptoms.    ED Prescriptions     Medication Sig Dispense Auth. Provider   promethazine-dextromethorphan (PROMETHAZINE-DM) 6.25-15 MG/5ML syrup Take 5 mLs by mouth 4 (four) times daily as needed. 100 mL Volney American, PA-C   fluticasone [employer]) 50 MCG/ACT nasal spray Place 1 spray into both nostrils 2 (two) times daily. 16 g Volney American, [place]      PDMP not reviewed this encounter.   Volney American, [place] 03/10/22 1550

## 2022-03-18 ENCOUNTER — Ambulatory Visit: Payer: 59 | Attending: Cardiology

## 2022-03-18 VITALS — BP 122/78 | HR 67

## 2022-03-18 DIAGNOSIS — Z0130 Encounter for examination of blood pressure without abnormal findings: Secondary | ICD-10-CM

## 2022-03-18 NOTE — Progress Notes (Signed)
Nurse visit BP check 122/78, HR 64 after stopping lisinopril and starting toprol XL 25 mg qd  Patient did not monitor BP at home   Says he feels a little tired but overall has no complaints

## 2022-03-29 ENCOUNTER — Telehealth: Payer: Self-pay | Admitting: Cardiology

## 2022-03-29 NOTE — Telephone Encounter (Signed)
Pt c/o medication issue:  1. Name of Medication:   metoprolol succinate (TOPROL XL) 25 MG 24 hr tablet    2. How are you currently taking this medication (dosage and times per day)? As written   3. Are you having a reaction (difficulty breathing--STAT)? No   4. What is your medication issue? Pt states ever since he started this medication, he has been constipated. He denies any other symptoms, please advise.

## 2022-03-31 MED ORDER — METOPROLOL SUCCINATE ER 25 MG PO TB24: 12.5000 mg | ORAL_TABLET | Freq: Every day | ORAL | 3 refills | Status: DC

## 2022-03-31 NOTE — Telephone Encounter (Signed)
Patient notified and verbalized understanding. Patient had no further questions or concerns at this time.

## 2022-04-08 ENCOUNTER — Other Ambulatory Visit: Payer: Self-pay | Admitting: *Deleted

## 2022-04-08 DIAGNOSIS — I10 Essential (primary) hypertension: Secondary | ICD-10-CM

## 2022-05-20 ENCOUNTER — Ambulatory Visit: Payer: 59 | Admitting: Family Medicine

## 2022-05-26 ENCOUNTER — Encounter: Payer: Self-pay | Admitting: Family Medicine

## 2022-05-26 ENCOUNTER — Ambulatory Visit: Payer: 59 | Admitting: Family Medicine

## 2022-05-26 VITALS — BP 123/89 | HR 67 | Ht 66.0 in | Wt 206.0 lb

## 2022-05-26 DIAGNOSIS — I2510 Atherosclerotic heart disease of native coronary artery without angina pectoris: Secondary | ICD-10-CM | POA: Diagnosis not present

## 2022-05-26 DIAGNOSIS — E782 Mixed hyperlipidemia: Secondary | ICD-10-CM

## 2022-05-26 DIAGNOSIS — I10 Essential (primary) hypertension: Secondary | ICD-10-CM | POA: Diagnosis not present

## 2022-05-26 MED ORDER — SILDENAFIL CITRATE 100 MG PO TABS: ORAL_TABLET | ORAL | 3 refills | Status: DC

## 2022-05-26 NOTE — Progress Notes (Signed)
BP 123/89   Pulse 67   Ht  (1.676 m)   Wt 206 lb (93.4 kg)   SpO2 97%   BMI 33.25 kg/m    Subjective:   Patient ID: Justin [name], male    DOB: 1955/05/22, 67 y.o.   MRN: [id number]  HPI: Justin [name] is a 67 y.o. male presenting on 05/26/2022 for Medical Management of Chronic Issues and Hyperlipidemia   HPI Hyperlipidemia Patient is coming in for recheck of his hyperlipidemia. The patient is currently taking fish oil and atorvastatin. They deny any issues with myalgias or history of liver damage from it. They deny any focal numbness or weakness or chest pain.   Hypertension and CAD recheck Patient is currently on chlorthalidone, he says Half week his blood pressure was running lower at the cardiology office.  And metoprolol, and their blood pressure today is 123/89. Patient denies any lightheadedness or dizziness. Patient denies headaches, blurred vision, chest pains, shortness of breath, or weakness. Denies any side effects from medication and is content with current medication.   BPH and erectile dysfunction recheck Patient is coming in for recheck on BPH Symptoms: Urinary frequency and nocturia Medication: Uses Viagra to help with erections and tamsulosin Last PSA: 11/18/2021 was 0.9  Relevant past medical, surgical, family and social history reviewed and updated as indicated. Interim medical history since our last visit reviewed. Allergies and medications reviewed and updated.  Review of Systems  Constitutional:  Negative for chills and fever.  Eyes:  Negative for visual disturbance.  Respiratory:  Negative for shortness of breath and wheezing.   Cardiovascular:  Negative for chest pain and leg swelling.  Genitourinary:  Positive for frequency.  Musculoskeletal:  Negative for back pain and gait problem.  Skin:  Negative for rash.  Neurological:  Negative for dizziness, weakness and light-headedness.  Psychiatric/Behavioral:  Positive for sleep disturbance.    All other systems reviewed and are negative.   Per HPI unless specifically indicated above   Allergies as of 05/26/2022   No Known Allergies      Medication List        Accurate as of May 26, 2022  3:44 PM. If you have any questions, ask your nurse or doctor.          STOP taking these medications    fluticasone 50 MCG/ACT nasal spray Commonly known as: FLONASE Stopped by: Nils Pyle, MD   promethazine-dextromethorphan 6.25-15 MG/5ML syrup Commonly known as: PROMETHAZINE-DM Stopped by: Elige Radon Joshia Kitchings, MD       TAKE these medications    aspirin 81 MG tablet Take 81 mg by mouth daily.   atorvastatin 20 MG tablet Commonly known as: LIPITOR TAKE ONE TABLET BY MOUTH EVERY DAY   CENTRUM SILVER PO Take 1 tablet by mouth daily.   chlorthalidone 25 MG tablet Commonly known as: HYGROTON Take 1 tablet (25 mg total) by mouth daily. What changed: how much to take   FISH OIL CONCENTRATE PO Take 1,200 mg by mouth daily.   metoprolol succinate 25 MG 24 hr tablet Commonly known as: Toprol XL Take 0.5 tablets (12.5 mg total) by mouth daily.   sildenafil 100 MG tablet Commonly known as: VIAGRA TAKE 0.5-1 TABLETS BY MOUTH DAILY AS NEEDED   tamsulosin 0.4 MG Caps capsule Commonly known as: FLOMAX Take 1 capsule (0.4 mg total) by mouth daily.         Objective:   BP 123/89   Pulse 67  Ht  (1.676 m)   Wt 206 lb (93.4 kg)   SpO2 97%   BMI 33.25 kg/m   Wt Readings from Last 3 Encounters:  05/26/22 206 lb (93.4 kg)  03/03/22 202 lb 12.8 oz (92 kg)  11/18/21 193 lb (87.5 kg)    Physical Exam Vitals and nursing note reviewed.  Constitutional:      General: He is not in acute distress.    Appearance: He is well-developed. He is not diaphoretic.  Eyes:     General: No scleral icterus.    Conjunctiva/sclera: Conjunctivae normal.  Neck:     Thyroid: No thyromegaly.  Cardiovascular:     Rate and Rhythm: Normal rate and regular  rhythm.     Heart sounds: Normal heart sounds. No murmur heard. Pulmonary:     Effort: Pulmonary effort is normal. No respiratory distress.     Breath sounds: Normal breath sounds. No wheezing.  Musculoskeletal:        General: No swelling. Normal range of motion.     Cervical back: Neck supple.  Lymphadenopathy:     Cervical: No cervical adenopathy.  Skin:    General: Skin is warm and dry.     Findings: No rash.  Neurological:     Mental Status: He is alert and oriented to person, place, and time.     Coordination: Coordination normal.  Psychiatric:        Behavior: Behavior normal.       Assessment & Plan:   Problem List Items Addressed This Visit       Cardiovascular and Mediastinum   Hypertension - Primary   Relevant Medications   sildenafil (VIAGRA) 100 MG tablet   Other Relevant Orders   CBC with Differential/Platelet   CMP14+EGFR   CAD (coronary artery disease)   Relevant Medications   sildenafil (VIAGRA) 100 MG tablet   Other Relevant Orders   CBC with Differential/Platelet     Other   Mixed hyperlipidemia   Relevant Medications   sildenafil (VIAGRA) 100 MG tablet   Other Relevant Orders   Lipid panel    Patient's blood pressure and heart rate everything looks good.  Will do blood work today, no changes. Follow up plan: Return in about 6 months (around 11/25/2022), or if symptoms worsen or fail to improve, for Hypertension and hyperlipidemia and CAD.  Counseling provided for all of the vaccine components Orders Placed This Encounter  Procedures   CBC with Differential/Platelet   CMP14+EGFR   Lipid panel    Arville Care, MD [facility] 05/26/2022, 3:44 PM

## 2022-05-27 LAB — LIPID PANEL
Chol/HDL Ratio: 2.4 ratio (ref 0.0–5.0)
Cholesterol, Total: 123 mg/dL (ref 100–199)
HDL: 52 mg/dL (ref >39)
LDL Chol Calc (NIH): 53 mg/dL (ref 0–99)
Triglycerides: 93 mg/dL (ref 0–149)
VLDL Cholesterol Cal: 18 mg/dL (ref 5–40)

## 2022-05-27 LAB — CMP14+EGFR
ALT: 31 IU/L (ref 0–44)
AST: 24 IU/L (ref 0–40)
Albumin/Globulin Ratio: 1.7 (ref 1.2–2.2)
Albumin: 4 g/dL (ref 3.9–4.9)
Alkaline Phosphatase: 44 IU/L (ref 44–121)
BUN/Creatinine Ratio: 17 (ref 10–24)
BUN: 16 mg/dL (ref 8–27)
Bilirubin Total: 0.6 mg/dL (ref 0.0–1.2)
CO2: 24 mmol/L (ref 20–29)
Calcium: 9 mg/dL (ref 8.6–10.2)
Chloride: 99 mmol/L (ref 96–106)
Creatinine, Ser: 0.92 mg/dL (ref 0.76–1.27)
Globulin, Total: 2.3 g/dL (ref 1.5–4.5)
Glucose: 82 mg/dL (ref 70–99)
Potassium: 3.6 mmol/L (ref 3.5–5.2)
Sodium: 138 mmol/L (ref 134–144)
Total Protein: 6.3 g/dL (ref 6.0–8.5)
eGFR: 93 mL/min/{1.73_m2} (ref >59)

## 2022-05-27 LAB — CBC WITH DIFFERENTIAL/PLATELET
Basophils Absolute: 0.1 10*3/uL (ref 0.0–0.2)
Basos: 1 %
EOS (ABSOLUTE): 0.4 10*3/uL (ref 0.0–0.4)
Eos: 7 %
Hematocrit: 45.8 % (ref 37.5–51.0)
Hemoglobin: 15.9 g/dL (ref 13.0–17.7)
Immature Grans (Abs): 0 10*3/uL (ref 0.0–0.1)
Immature Granulocytes: 0 %
Lymphocytes Absolute: 1.4 10*3/uL (ref 0.7–3.1)
Lymphs: 25 %
MCH: 31.2 pg (ref 26.6–33.0)
MCHC: 34.7 g/dL (ref 31.5–35.7)
MCV: 90 fL (ref 79–97)
Monocytes Absolute: 0.5 10*3/uL (ref 0.1–0.9)
Monocytes: 9 %
Neutrophils Absolute: 3.3 10*3/uL (ref 1.4–7.0)
Neutrophils: 58 %
Platelets: 172 10*3/uL (ref 150–450)
RBC: 5.09 x10E6/uL (ref 4.14–5.80)
RDW: 13.3 % (ref 11.6–15.4)
WBC: 5.6 10*3/uL (ref 3.4–10.8)

## 2022-07-22 ENCOUNTER — Other Ambulatory Visit: Payer: Self-pay | Admitting: Family Medicine

## 2022-07-23 ENCOUNTER — Other Ambulatory Visit: Payer: Self-pay | Admitting: Family Medicine

## 2022-07-28 ENCOUNTER — Other Ambulatory Visit: Payer: Self-pay | Admitting: Family Medicine

## 2022-07-28 ENCOUNTER — Telehealth: Payer: Self-pay | Admitting: Family Medicine

## 2022-07-28 NOTE — Telephone Encounter (Signed)
  Prescription Request  07/28/2022  Is this a "Controlled Substance" medicine? no  Have you seen your PCP in the last 2 weeks? no  If YES, route message to pool  -  If NO, patient needs to be scheduled for appointment.  What is the name of the medication or equipment? Lisinopril - Patient is out and has been denied two times. Don't show on his med list and wife states he takes every day  Have you contacted your pharmacy to request a refill? yes   Which pharmacy would you like this sent to? [facility]   Patient notified that their request is being sent to the clinical staff for review and that they should receive a response within 2 business days.

## 2022-08-18 ENCOUNTER — Other Ambulatory Visit: Payer: Self-pay | Admitting: Cardiology

## 2022-09-24 ENCOUNTER — Other Ambulatory Visit: Payer: Self-pay | Admitting: Family Medicine

## 2022-09-24 DIAGNOSIS — I10 Essential (primary) hypertension: Secondary | ICD-10-CM

## 2022-10-28 ENCOUNTER — Other Ambulatory Visit: Payer: Self-pay | Admitting: Physician Assistant

## 2022-11-24 ENCOUNTER — Encounter: Payer: Self-pay | Admitting: Family Medicine

## 2022-11-24 ENCOUNTER — Ambulatory Visit (INDEPENDENT_AMBULATORY_CARE_PROVIDER_SITE_OTHER): Payer: Medicare Other | Admitting: Family Medicine

## 2022-11-24 VITALS — BP 114/87 | HR 75 | Ht 66.0 in | Wt 199.0 lb

## 2022-11-24 DIAGNOSIS — Z23 Encounter for immunization: Secondary | ICD-10-CM

## 2022-11-24 DIAGNOSIS — E782 Mixed hyperlipidemia: Secondary | ICD-10-CM

## 2022-11-24 DIAGNOSIS — R350 Frequency of micturition: Secondary | ICD-10-CM | POA: Diagnosis not present

## 2022-11-24 DIAGNOSIS — G8929 Other chronic pain: Secondary | ICD-10-CM

## 2022-11-24 DIAGNOSIS — I10 Essential (primary) hypertension: Secondary | ICD-10-CM

## 2022-11-24 DIAGNOSIS — N401 Enlarged prostate with lower urinary tract symptoms: Secondary | ICD-10-CM | POA: Diagnosis not present

## 2022-11-24 DIAGNOSIS — M25511 Pain in right shoulder: Secondary | ICD-10-CM

## 2022-11-24 DIAGNOSIS — I2510 Atherosclerotic heart disease of native coronary artery without angina pectoris: Secondary | ICD-10-CM

## 2022-11-24 MED ORDER — TAMSULOSIN HCL 0.4 MG PO CAPS: 0.4000 mg | ORAL_CAPSULE | Freq: Every day | ORAL | 3 refills | Status: DC

## 2022-11-24 MED ORDER — CHLORTHALIDONE 25 MG PO TABS: 25.0000 mg | ORAL_TABLET | Freq: Every day | ORAL | 3 refills | Status: DC

## 2022-11-24 NOTE — Progress Notes (Signed)
BP 114/87   Pulse 75   Ht 5\' 6"  (1.676 m)   Wt 199 lb (90.3 kg)   SpO2 95%   BMI 32.12 kg/m    Subjective:   Patient ID: Justin [name], male    DOB: 05/04/1955, 67 y.o.   MRN: [id number]  HPI: BRYSUN ESCHMANN is a 67 y.o. male presenting on 11/24/2022 for Medical Management of Chronic Issues, Hypertension, and Hyperlipidemia   HPI Hypertension Patient is currently on metoprolol and chlorthalidone, and their blood pressure today is 114/87. Patient denies any lightheadedness or dizziness. Patient denies headaches, blurred vision, chest pains, shortness of breath, or weakness. Denies any side effects from medication and is content with current medication.   Hyperlipidemia and CAD Patient is coming in for recheck of his hyperlipidemia. The patient is currently taking Lipitor and fish oils. They deny any issues with myalgias or history of liver damage from it. They deny any focal numbness or weakness or chest pain.   Right shoulder pain Patient has complaints of right shoulder pain that is been bothering him off and on for 6 months.  He says its gotten to the point where he cannot throw with that arm and reaching overhead hurts and it hurts on the lateral aspect of the top of his shoulder.  He denies any weakness or numbness shooting down the arm but just says it does not seem to get any better.  He cannot recall any specific incident or trauma that brought it on but has been having problems with it.  Relevant past medical, surgical, family and social history reviewed and updated as indicated. Interim medical history since our last visit reviewed. Allergies and medications reviewed and updated.  Review of Systems  Constitutional:  Negative for chills and fever.  Eyes:  Negative for visual disturbance.  Respiratory:  Negative for shortness of breath and wheezing.   Cardiovascular:  Negative for chest pain and leg swelling.  Musculoskeletal:  Positive for arthralgias and myalgias.  Negative for back pain and gait problem.  Skin:  Negative for rash.  Neurological:  Negative for dizziness, weakness and light-headedness.  All other systems reviewed and are negative.   Per HPI unless specifically indicated above   Allergies as of 11/24/2022   No Known Allergies      Medication List        Accurate as of November 24, 2022  4:21 PM. If you have any questions, ask your nurse or doctor.          aspirin 81 MG tablet Take 81 mg by mouth daily.   atorvastatin 20 MG tablet Commonly known as: LIPITOR TAKE ONE TABLET BY MOUTH EVERY DAY   CENTRUM SILVER PO Take 1 tablet by mouth daily.   chlorthalidone 25 MG tablet Commonly known as: HYGROTON Take 1 tablet (25 mg total) by mouth daily.   FISH OIL CONCENTRATE PO Take 1,200 mg by mouth daily.   metoprolol succinate 25 MG 24 hr tablet Commonly known as: TOPROL-XL TAKE ONE TABLET BY MOUTH EVERY DAY   sildenafil 100 MG tablet Commonly known as: VIAGRA TAKE 1/2 TO ONE TABLET BY MOUTH DAILY ASNEEDED   tamsulosin 0.4 MG Caps capsule Commonly known as: FLOMAX Take 1 capsule (0.4 mg total) by mouth daily.         Objective:   BP 114/87   Pulse 75   Ht 5\' 6"  (1.676 m)   Wt 199 lb (90.3 kg)   SpO2 95%   BMI  32.12 kg/m   Wt Readings from Last 3 Encounters:  11/24/22 199 lb (90.3 kg)  05/26/22 206 lb (93.4 kg)  03/03/22 202 lb 12.8 oz (92 kg)    Physical Exam Vitals and nursing note reviewed.  Constitutional:      General: He is not in acute distress.    Appearance: He is well-developed. He is not diaphoretic.  Eyes:     General: No scleral icterus.    Conjunctiva/sclera: Conjunctivae normal.  Neck:     Thyroid: No thyromegaly.  Cardiovascular:     Rate and Rhythm: Normal rate and regular rhythm.     Heart sounds: Normal heart sounds. No murmur heard. Pulmonary:     Effort: Pulmonary effort is normal. No respiratory distress.     Breath sounds: Normal breath sounds. No wheezing.   Musculoskeletal:        General: Normal range of motion.     Right shoulder: Tenderness and crepitus present. No bony tenderness. Normal range of motion. Normal strength. Normal pulse.     Cervical back: Neck supple.     Comments: Positive Hawkins impingement and positive external and internal rotation pain.  Negative empty can test  Lymphadenopathy:     Cervical: No cervical adenopathy.  Skin:    General: Skin is warm and dry.     Findings: No rash.  Neurological:     Mental Status: He is alert and oriented to person, place, and time.     Coordination: Coordination normal.  Psychiatric:        Behavior: Behavior normal.       Assessment & Plan:   Problem List Items Addressed This Visit       Cardiovascular and Mediastinum   Hypertension - Primary   Relevant Medications   chlorthalidone (HYGROTON) 25 MG tablet   Other Relevant Orders   CBC with Differential/Platelet   CMP14+EGFR   Lipid panel   CAD (coronary artery disease)   Relevant Medications   chlorthalidone (HYGROTON) 25 MG tablet   Other Relevant Orders   CBC with Differential/Platelet   CMP14+EGFR   Lipid panel     Other   Mixed hyperlipidemia   Relevant Medications   chlorthalidone (HYGROTON) 25 MG tablet   Other Relevant Orders   CBC with Differential/Platelet   CMP14+EGFR   Lipid panel   Other Visit Diagnoses     Essential hypertension       Relevant Medications   chlorthalidone (HYGROTON) 25 MG tablet   Benign prostatic hyperplasia with urinary frequency       Relevant Medications   tamsulosin (FLOMAX) 0.4 MG CAPS capsule   Other Relevant Orders   PSA, total and free   Chronic right shoulder pain       Relevant Orders   Ambulatory referral to Orthopedic Surgery       Concern for possible rotator cuff tear, will refer to orthopedic.  Blood pressure looks good today, no changes.  Follows with cardio as well and seems like things are doing well. Follow up plan: Return in about 6 months  (around 05/25/2023), or if symptoms worsen or fail to improve, for Hypertension and hyperlipidemia.  Counseling provided for all of the vaccine components Orders Placed This Encounter  Procedures   CBC with Differential/Platelet   CMP14+EGFR   Lipid panel   PSA, total and free   Ambulatory referral to Orthopedic Surgery    Arville Care, MD [facility] 11/24/2022, 4:21 PM

## 2022-11-25 LAB — LIPID PANEL
Chol/HDL Ratio: 3 {ratio} (ref 0.0–5.0)
Cholesterol, Total: 147 mg/dL (ref 100–199)
HDL: 49 mg/dL (ref >39)
LDL Chol Calc (NIH): 80 mg/dL (ref 0–99)
Triglycerides: 98 mg/dL (ref 0–149)
VLDL Cholesterol Cal: 18 mg/dL (ref 5–40)

## 2022-11-25 LAB — CBC WITH DIFFERENTIAL/PLATELET
Basophils Absolute: 0 10*3/uL (ref 0.0–0.2)
Basos: 1 %
EOS (ABSOLUTE): 0.3 10*3/uL (ref 0.0–0.4)
Eos: 6 %
Hematocrit: 47.1 % (ref 37.5–51.0)
Hemoglobin: 16.8 g/dL (ref 13.0–17.7)
Immature Grans (Abs): 0 10*3/uL (ref 0.0–0.1)
Immature Granulocytes: 0 %
Lymphocytes Absolute: 1.7 10*3/uL (ref 0.7–3.1)
Lymphs: 32 %
MCH: 32.6 pg (ref 26.6–33.0)
MCHC: 35.7 g/dL (ref 31.5–35.7)
MCV: 92 fL (ref 79–97)
Monocytes Absolute: 0.5 10*3/uL (ref 0.1–0.9)
Monocytes: 9 %
Neutrophils Absolute: 2.8 10*3/uL (ref 1.4–7.0)
Neutrophils: 52 %
Platelets: 197 10*3/uL (ref 150–450)
RBC: 5.15 x10E6/uL (ref 4.14–5.80)
RDW: 12.4 % (ref 11.6–15.4)
WBC: 5.3 10*3/uL (ref 3.4–10.8)

## 2022-11-25 LAB — CMP14+EGFR
ALT: 34 [IU]/L (ref 0–44)
AST: 28 [IU]/L (ref 0–40)
Albumin: 4.1 g/dL (ref 3.9–4.9)
Alkaline Phosphatase: 44 [IU]/L (ref 44–121)
BUN/Creatinine Ratio: 17 (ref 10–24)
BUN: 16 mg/dL (ref 8–27)
Bilirubin Total: 0.8 mg/dL (ref 0.0–1.2)
CO2: 22 mmol/L (ref 20–29)
Calcium: 9.4 mg/dL (ref 8.6–10.2)
Chloride: 101 mmol/L (ref 96–106)
Creatinine, Ser: 0.95 mg/dL (ref 0.76–1.27)
Globulin, Total: 2.5 g/dL (ref 1.5–4.5)
Glucose: 87 mg/dL (ref 70–99)
Potassium: 3.3 mmol/L — ABNORMAL LOW (ref 3.5–5.2)
Sodium: 140 mmol/L (ref 134–144)
Total Protein: 6.6 g/dL (ref 6.0–8.5)
eGFR: 89 mL/min/{1.73_m2} (ref >59)

## 2022-11-25 LAB — PSA, TOTAL AND FREE
PSA, Free Pct: 30 %
PSA, Free: 0.24 ng/mL
Prostate Specific Ag, Serum: 0.8 ng/mL (ref 0.0–4.0)

## 2022-12-01 ENCOUNTER — Other Ambulatory Visit: Payer: Self-pay

## 2022-12-01 MED ORDER — POTASSIUM CHLORIDE CRYS ER 10 MEQ PO TBCR: 10.0000 meq | EXTENDED_RELEASE_TABLET | Freq: Every day | ORAL | 1 refills | Status: DC

## 2022-12-08 DIAGNOSIS — M19011 Primary osteoarthritis, right shoulder: Secondary | ICD-10-CM | POA: Diagnosis not present

## 2022-12-14 DIAGNOSIS — M19011 Primary osteoarthritis, right shoulder: Secondary | ICD-10-CM | POA: Diagnosis not present

## 2022-12-23 DIAGNOSIS — H40033 Anatomical narrow angle, bilateral: Secondary | ICD-10-CM | POA: Diagnosis not present

## 2022-12-23 DIAGNOSIS — H2513 Age-related nuclear cataract, bilateral: Secondary | ICD-10-CM | POA: Diagnosis not present

## 2022-12-24 ENCOUNTER — Other Ambulatory Visit: Payer: Self-pay

## 2022-12-24 DIAGNOSIS — I77810 Thoracic aortic ectasia: Secondary | ICD-10-CM

## 2022-12-29 ENCOUNTER — Encounter: Payer: Self-pay | Admitting: *Deleted

## 2023-01-05 DIAGNOSIS — I77810 Thoracic aortic ectasia: Secondary | ICD-10-CM | POA: Diagnosis not present

## 2023-01-06 LAB — BASIC METABOLIC PANEL
BUN/Creatinine Ratio: 16 (ref 10–24)
BUN: 16 mg/dL (ref 8–27)
CO2: 24 mmol/L (ref 20–29)
Calcium: 9.7 mg/dL (ref 8.6–10.2)
Chloride: 101 mmol/L (ref 96–106)
Creatinine, Ser: 0.97 mg/dL (ref 0.76–1.27)
Glucose: 91 mg/dL (ref 70–99)
Potassium: 3.4 mmol/L — ABNORMAL LOW (ref 3.5–5.2)
Sodium: 140 mmol/L (ref 134–144)
eGFR: 87 mL/min/{1.73_m2} (ref >59)

## 2023-01-10 ENCOUNTER — Telehealth: Payer: Self-pay

## 2023-01-10 DIAGNOSIS — Z79899 Other long term (current) drug therapy: Secondary | ICD-10-CM

## 2023-01-10 NOTE — Telephone Encounter (Signed)
-----   Message from Jacolyn Reedy sent at 01/10/2023  7:44 AM EST ----- K low. Please take 2 Kdur 10 meq for 3 days then 1 daily. Repeat bmet in 3-4 weeks. thanks

## 2023-01-10 NOTE — Telephone Encounter (Signed)
Pt notified via mychart

## 2023-01-13 ENCOUNTER — Ambulatory Visit (HOSPITAL_COMMUNITY): Payer: 59

## 2023-01-17 ENCOUNTER — Ambulatory Visit (HOSPITAL_COMMUNITY)
Admission: RE | Admit: 2023-01-17 | Discharge: 2023-01-17 | Disposition: A | Discharge status: Home / Self Care | Payer: Medicare Other | Source: Ambulatory Visit | Attending: Physician Assistant | Admitting: Physician Assistant

## 2023-01-17 ENCOUNTER — Encounter (HOSPITAL_COMMUNITY): Payer: Self-pay | Admitting: Radiology

## 2023-01-17 DIAGNOSIS — N281 Cyst of kidney, acquired: Secondary | ICD-10-CM | POA: Diagnosis not present

## 2023-01-17 DIAGNOSIS — I77810 Thoracic aortic ectasia: Secondary | ICD-10-CM

## 2023-01-17 DIAGNOSIS — I7121 Aneurysm of the ascending aorta, without rupture: Secondary | ICD-10-CM | POA: Diagnosis not present

## 2023-01-17 MED ORDER — IOHEXOL 350 MG/ML SOLN
75.0000 mL | Freq: Once | INTRAVENOUS | Status: AC | PRN
  Administered 2023-01-17: 75 mL via INTRAVENOUS

## 2023-01-21 ENCOUNTER — Ambulatory Visit (HOSPITAL_COMMUNITY)
Admission: RE | Admit: 2023-01-21 | Discharge: 2023-01-21 | Disposition: A | Discharge status: Home / Self Care | Payer: Medicare Other | Source: Ambulatory Visit | Attending: Family Medicine | Admitting: Family Medicine

## 2023-01-21 DIAGNOSIS — Z952 Presence of prosthetic heart valve: Secondary | ICD-10-CM | POA: Insufficient documentation

## 2023-01-21 LAB — ECHOCARDIOGRAM COMPLETE
AR max vel: 1.15 cm2
AV Area VTI: 1.16 cm2
AV Area mean vel: 1.13 cm2
AV Mean grad: 8 mm[Hg]
AV Peak grad: 14.8 mm[Hg]
Ao pk vel: 1.93 m/s
Area-P 1/2: 4.63 cm2
Calc EF: 60.2 %
MV VTI: 1.7 cm2
S' Lateral: 3.4 cm
Single Plane A2C EF: 62.3 %
Single Plane A4C EF: 54.6 %

## 2023-02-07 ENCOUNTER — Other Ambulatory Visit: Payer: Self-pay | Admitting: Family Medicine

## 2023-03-24 ENCOUNTER — Encounter: Payer: Self-pay | Admitting: Family Medicine

## 2023-03-24 ENCOUNTER — Ambulatory Visit: Payer: Medicare Other | Admitting: Family Medicine

## 2023-03-24 VITALS — BP 128/82 | HR 81 | Temp 98.0°F | Ht 66.0 in | Wt 208.0 lb

## 2023-03-24 DIAGNOSIS — Z0000 Encounter for general adult medical examination without abnormal findings: Secondary | ICD-10-CM | POA: Diagnosis not present

## 2023-03-24 MED ORDER — SILDENAFIL CITRATE 100 MG PO TABS: ORAL_TABLET | ORAL | 3 refills | Status: AC

## 2023-03-24 NOTE — Progress Notes (Signed)
Subjective:   Justin [name] is a 68 y.o. male who presents for an Initial Medicare Annual Wellness Visit.  Visit Complete: In person  Patient Medicare AWV questionnaire was completed by the patient on 03/24/2023; I have confirmed that all information answered by patient is correct and no changes since this date.  Cardiac Risk Factors include: advanced age (>89men, >43 women);male gender;obesity (BMI >30kg/m2) The 10-year ASCVD risk score (Arnett DK, et al., 2019) is: 12.1%   Values used to calculate the score:     Age: 54 years     Sex: Male     Is Non-Hispanic African American: No     Diabetic: No     Tobacco smoker: No     Systolic Blood Pressure: 128 mmHg     Is BP treated: Yes     HDL Cholesterol: 49 mg/dL     Total Cholesterol: 147 mg/dL      Objective:    Today's Vitals   03/24/23 0943 03/24/23 0945 03/24/23 0958  BP:  (!) 131/92 128/82  Pulse:  81   Temp:  98 F (36.7 C)   SpO2:  95% 98%  Weight: 208 lb (94.3 kg) 208 lb (94.3 kg)   Height: 5\' 6"  (1.676 m) 5\' 6"  (1.676 m)   PainSc:  3     Body mass index is 33.57 kg/m.     03/24/2023    9:50 AM 07/24/2020    7:21 AM 12/15/2019    7:32 PM 04/17/2011    9:00 PM 04/17/2011    7:00 PM 04/16/2011    5:47 AM 04/14/2011   11:17 AM  Advanced Directives  Does Patient Have a Medical Advance Directive? Yes No No Patient does not have advance directive   Patient does not have advance directive  Type of Advance Directive Healthcare Power of Attorney        Does patient want to make changes to medical advance directive? No - Patient declined        Would patient like information on creating a medical advance directive?  No - Patient declined       Pre-existing out of facility DNR order (yellow form or pink MOST form)    No No No     Current Medications (verified) Outpatient Encounter Medications as of 03/24/2023  Medication Sig   aspirin 81 MG tablet Take 81 mg by mouth daily.   atorvastatin (LIPITOR) 20 MG tablet TAKE ONE  TABLET BY MOUTH EVERY DAY   chlorthalidone (HYGROTON) 25 MG tablet Take 1 tablet (25 mg total) by mouth daily.   metoprolol succinate (TOPROL-XL) 25 MG 24 hr tablet TAKE ONE TABLET BY MOUTH EVERY DAY   Multiple Vitamins-Minerals (CENTRUM SILVER PO) Take 1 tablet by mouth daily.   Omega-3 Fatty Acids (FISH OIL CONCENTRATE PO) Take 1,200 mg by mouth daily.   potassium chloride (KLOR-CON M) 10 MEQ tablet Take 1 tablet (10 mEq total) by mouth daily.   sildenafil (VIAGRA) 100 MG tablet TAKE 1/2 TO ONE TABLET BY MOUTH DAILY ASNEEDED   tamsulosin (FLOMAX) 0.4 MG CAPS capsule Take 1 capsule (0.4 mg total) by mouth daily.   No facility-administered encounter medications on file as of 03/24/2023.    Allergies (verified) Patient has no known allergies.   History: Past Medical History:  Diagnosis Date   Bicuspid aortic valve 04/16/2011   Complicated by severe aortic regurgitation secondary to cusp prolapse, status post 27mm Edwards bioprosthesic AVR    Essential hypertension  Past Surgical History:  Procedure Laterality Date   ANAL FISSURE REPAIR     AORTIC VALVE REPLACEMENT  04/16/2011   Procedure: AORTIC VALVE REPLACEMENT (AVR);  Surgeon: Purcell Nails, MD;  Location: [facility] OR;  Service: Open Heart Surgery;  Laterality: N/A;  aortic valve replacement   COLONOSCOPY N/A 07/24/2020   Procedure: COLONOSCOPY;  Surgeon: Malissa Hippo, MD;  Location: [facility]ITE;  Service: Endoscopy;  Laterality: N/A;  815   Left arm surgery     VASECTOMY     Family History  Problem Relation Age of Onset   Cancer Father 54   Kidney cancer Father    Social History   Socioeconomic History   Marital status: Married    Spouse name: Not on file   Number of children: 2   Years of education: Not on file   Highest education level: Associate degree: occupational, Scientist, product/process development, or vocational program  Occupational History   Occupation: Curator  Tobacco Use   Smoking status: Never   Smokeless tobacco: Never   Vaping Use   Vaping status: Never Used  Substance and Sexual Activity   Alcohol use: Not Currently    Comment: Occasionally--socially   Drug use: No   Sexual activity: Not on file  Other Topics Concern   Not on file  Social History Narrative   Not on file   Social Drivers of Health   Financial Resource Strain: Low Risk  (05/25/2022)   Overall Financial Resource Strain (CARDIA)    Difficulty of Paying Living Expenses: Not hard at all  Food Insecurity: No Food Insecurity (05/25/2022)   Hunger Vital Sign    Worried About Running Out of Food in the Last Year: Never true    Ran Out of Food in the Last Year: Never true  Transportation Needs: Unmet Transportation Needs (05/25/2022)   PRAPARE - Administrator, Civil Service (Medical): Yes    Lack of Transportation (Non-Medical): No  Physical Activity: Inactive (03/24/2023)   Exercise Vital Sign    Days of Exercise per Week: 0 days    Minutes of Exercise per Session: 0 min  Stress: No Stress Concern Present (05/25/2022)   [employer] of Occupational Health - Occupational Stress Questionnaire    Feeling of Stress : Only a little  Social Connections: Unknown (05/25/2022)   Social Connection and Isolation Panel [NHANES]    Frequency of Communication with Friends and Family: More than three times a week    Frequency of Social Gatherings with Friends and Family: More than three times a week    Attends Religious Services: More than 4 times per year    Active Member of [employer] or Organizations: Patient declined    Attends Engineer, structural: Not on file    Marital Status: Married    Tobacco Counseling Counseling given: Not Answered   Clinical Intake:  Pre-visit preparation completed: Yes  Pain : 0-10 Pain Score: 3  Pain Location: Shoulder Pain Orientation: Right Pain Descriptors / Indicators: Aching Pain Onset: Other (comment) (Over one year) Pain Frequency: Intermittent Pain Relieving Factors:  Injections, NSAIDS Effect of Pain on Daily Activities: With pushing pulling, lifting, throwing  Pain Relieving Factors: Injections, NSAIDS  BMI - recorded: 33 Nutritional Status: BMI > 30  Obese Nutritional Risks: None Diabetes: No  How often do you need to have someone help you when you read instructions, pamphlets, or other written materials from your doctor or pharmacy?: 5 - Always What is the last grade  level you completed in school?: Trade school  Interpreter Needed?: No  Information entered by :: Dorene Sorrow, LPN   Activities of Daily Living    03/24/2023    9:54 AM 03/24/2023    7:52 AM  In your present state of health, do you have any difficulty performing the following activities:  Hearing? 0 0  Vision? 0 0  Difficulty concentrating or making decisions? 0 0  Walking or climbing stairs? 0 0  Dressing or bathing? 0 0  Doing errands, shopping? 0 0  Preparing Food and eating ? N N  Using the Toilet? N N  In the past six months, have you accidently leaked urine? N N  Do you have problems with loss of bowel control? N N  Managing your Medications? N N  Managing your Finances? N N  Housekeeping or managing your Housekeeping? N N    Patient Care Team: Damian Hofstra, Elige Radon, MD as PCP - General (Family Medicine) Jonelle Sidle, MD as PCP - Cardiology (Cardiology) de Melanie Crazier, MD (Inactive) as Referring Physician (Cardiology)  Indicate any recent Medical Services you may have received from other than [facility] providers in the past year (date may be approximate).     Assessment:   This is a routine wellness examination for Esau.  Hearing/Vision screen No results found.   Goals Addressed   None    Depression Screen    03/24/2023    9:43 AM 11/24/2022    3:52 PM 05/26/2022    3:33 PM 11/18/2021    2:55 PM 01/13/2021    8:23 AM 11/17/2020    2:46 PM 04/08/2020    8:59 AM  PHQ 2/9 Scores  PHQ - 2 Score 0 0 0 0 0 0 0  PHQ- 9 Score  0 0  0      Fall  Risk    03/24/2023    9:43 AM 03/24/2023    7:52 AM 11/24/2022    3:52 PM 05/26/2022    3:33 PM 11/18/2021    2:53 PM  Fall Risk   Falls in the past year? 0 0 0 0 0  Injury with Fall?  0       MEDICARE RISK AT HOME: Medicare Risk at Home Any stairs in or around the home?: (Patient-Rptd) No If so, are there any without handrails?: (Patient-Rptd) No Home free of loose throw rugs in walkways, pet beds, electrical cords, etc?: (Patient-Rptd) No Adequate lighting in your home to reduce risk of falls?: (Patient-Rptd) Yes Life alert?: (Patient-Rptd) No Use of a cane, walker or w/c?: (Patient-Rptd) No Grab bars in the bathroom?: (Patient-Rptd) No Shower chair or bench in shower?: (Patient-Rptd) No Elevated toilet seat or a handicapped toilet?: (Patient-Rptd) No  TIMED UP AND GO:  Was the test performed? NO     Cognitive Function:        03/24/2023    9:52 AM  6CIT Screen  What Year? 0 points  What month? 0 points  What time? 0 points  Count back from 20 0 points  Months in reverse 0 points  Repeat phrase 0 points  Total Score 0 points    Immunizations Immunization History  Administered Date(s) Administered   Fluad Trivalent(High Dose 65+) 11/24/2022   Influenza Inj Mdck Quad Pf 11/06/2018   Influenza Inj Mdck Quad With Preservative 01/08/2018   Influenza,inj,Quad PF,6+ Mos 11/17/2020, 11/18/2021   Influenza-Unspecified 11/27/2014, 11/07/2015, 11/10/2016   Moderna Sars-Covid-2 Vaccination 03/31/2019, 05/04/2019, 12/29/2019   Td 07/10/2007,  11/07/2015   Tdap 11/07/2015   Zoster Recombinant(Shingrix) 11/17/2020, 02/17/2021    TDAP status: Up to date  Flu Vaccine status: Up to date  Pneumococcal vaccine status: Up to date  Covid-19 vaccine status: Information provided on how to obtain vaccines.   Qualifies for Shingles Vaccine? No   Zostavax completed Yes   Shingrix Completed?: Yes  Screening Tests Health Maintenance  Topic Date Due   COVID-19 Vaccine (4 -  2024-25 season) 10/10/2022   Pneumonia Vaccine 89+ Years old (1 of 2 - PCV) 11/24/2023 (Originally 07/05/1961)   Medicare Annual Wellness (AWV)  03/23/2024   DTaP/Tdap/Td (4 - Td or Tdap) 11/06/2025   Colonoscopy  07/25/2027   INFLUENZA VACCINE  Completed   Hepatitis C Screening  Completed   Zoster Vaccines- Shingrix  Completed   HPV VACCINES  Aged Out   HIV Screening  Discontinued    Health Maintenance  Health Maintenance Due  Topic Date Due   COVID-19 Vaccine (4 - 2024-25 season) 10/10/2022    Colorectal cancer screening: No longer required.   Lung Cancer Screening: (Low Dose CT Chest recommended if Age 45-80 years, 20 pack-year currently smoking OR have quit w/in 15years.) does not qualify.   Lung Cancer Screening Referral:   Additional Screening:  Hepatitis C Screening: does not qualify;   Vision Screening: Recommended annual ophthalmology exams for early detection of glaucoma and other disorders of the eye. Is the patient up to date with their annual eye exam?  Yes  Who is the provider or what is the name of the office in which the patient attends annual eye exams? If pt is not established with a provider, would they like to be referred to a provider to establish care?    .   Dental Screening: Recommended annual dental exams for proper oral hygiene      Plan:     I have personally reviewed and noted the following in the patient's chart:   Medical and social history Use of alcohol, tobacco or illicit drugs  Current medications and supplements including opioid prescriptions.  Functional ability and status Nutritional status Physical activity Advanced directives List of other physicians Hospitalizations, surgeries, and ER visits in previous 12 months Vitals Screenings to include cognitive, depression, and falls Referrals and appointments  In addition, I have reviewed and discussed with patient certain preventive protocols, quality metrics, and best practice  recommendations. A written personalized care plan for preventive services as well as general preventive health recommendations were provided to patient.     Elige Radon Jaeleen Inzunza, MD   03/24/2023   After Visit Summary: (MyChart) Due to this being a telephonic visit, the after visit summary with patients personalized plan was offered to patient via MyChart   Nurse Notes: n/a

## 2023-05-26 ENCOUNTER — Ambulatory Visit: Payer: Medicare Other | Admitting: Family Medicine

## 2023-06-01 ENCOUNTER — Encounter: Payer: Self-pay | Admitting: Family Medicine

## 2023-06-01 ENCOUNTER — Ambulatory Visit (INDEPENDENT_AMBULATORY_CARE_PROVIDER_SITE_OTHER): Payer: Medicare Other | Admitting: Family Medicine

## 2023-06-01 VITALS — BP 125/82 | HR 58 | Ht 66.0 in | Wt 205.0 lb

## 2023-06-01 DIAGNOSIS — E782 Mixed hyperlipidemia: Secondary | ICD-10-CM | POA: Diagnosis not present

## 2023-06-01 DIAGNOSIS — I10 Essential (primary) hypertension: Secondary | ICD-10-CM | POA: Diagnosis not present

## 2023-06-01 DIAGNOSIS — I2510 Atherosclerotic heart disease of native coronary artery without angina pectoris: Secondary | ICD-10-CM | POA: Diagnosis not present

## 2023-06-01 LAB — LIPID PANEL

## 2023-06-01 MED ORDER — POTASSIUM CHLORIDE CRYS ER 10 MEQ PO TBCR: 10.0000 meq | EXTENDED_RELEASE_TABLET | Freq: Every day | ORAL | 1 refills | Status: DC

## 2023-06-01 NOTE — Progress Notes (Signed)
 BP 125/82   Pulse (!) 58   Ht 5\' 6"  (1.676 m)   Wt 205 lb (93 kg)   SpO2 96%   BMI 33.09 kg/m    Subjective:   Patient ID: Justin [name], male    DOB: [birth year], 68 y.o.   MRN: [id number]  HPI: Justin [name] is a 68 y.o. male presenting on 06/01/2023 for Medical Management of Chronic Issues, Hyperlipidemia, and Hypertension   HPI Hypertension Patient is currently on metoprolol  and chlorthalidone , and their blood pressure today is 125/82. Patient denies any lightheadedness or dizziness. Patient denies headaches, blurred vision, chest pains, shortness of breath, or weakness. Denies any side effects from medication and is content with current medication.   Hyperlipidemia and CAD recheck Patient is coming in for recheck of his hyperlipidemia. The patient is currently taking atorvastatin  and fish oils. They deny any issues with myalgias or history of liver damage from it. They deny any focal numbness or weakness or chest pain.  Patient sees cardiology  Relevant past medical, surgical, family and social history reviewed and updated as indicated. Interim medical history since our last visit reviewed. Allergies and medications reviewed and updated.  Review of Systems  Constitutional:  Negative for chills and fever.  Eyes:  Negative for visual disturbance.  Respiratory:  Negative for shortness of breath and wheezing.   Cardiovascular:  Negative for chest pain and leg swelling.  Musculoskeletal:  Negative for back pain and gait problem.  Skin:  Negative for rash.  Neurological:  Negative for dizziness, weakness and light-headedness.  All other systems reviewed and are negative.   Per HPI unless specifically indicated above   Allergies as of 06/01/2023   No Known Allergies      Medication List        Accurate as of June 01, 2023  9:31 AM. If you have any questions, ask your nurse or doctor.          aspirin  81 MG tablet Take 81 mg by mouth daily.   atorvastatin  20  MG tablet Commonly known as: LIPITOR TAKE ONE TABLET BY MOUTH EVERY DAY   CENTRUM SILVER PO Take 1 tablet by mouth daily.   chlorthalidone  25 MG tablet Commonly known as: HYGROTON  Take 1 tablet (25 mg total) by mouth daily.   FISH OIL CONCENTRATE PO Take 1,200 mg by mouth daily.   metoprolol  succinate 25 MG 24 hr tablet Commonly known as: TOPROL -XL TAKE ONE TABLET BY MOUTH EVERY DAY   potassium chloride  10 MEQ tablet Commonly known as: KLOR-CON  M Take 1 tablet (10 mEq total) by mouth daily.   sildenafil  100 MG tablet Commonly known as: VIAGRA  TAKE 1/2 TO ONE TABLET BY MOUTH DAILY ASNEEDED   tamsulosin  0.4 MG Caps capsule Commonly known as: FLOMAX  Take 1 capsule (0.4 mg total) by mouth daily.         Objective:   BP 125/82   Pulse (!) 58   Ht 5\' 6"  (1.676 m)   Wt 205 lb (93 kg)   SpO2 96%   BMI 33.09 kg/m   Wt Readings from Last 3 Encounters:  06/01/23 205 lb (93 kg)  03/24/23 208 lb (94.3 kg)  11/24/22 199 lb (90.3 kg)    Physical Exam Vitals and nursing note reviewed.  Constitutional:      General: He is not in acute distress.    Appearance: He is well-developed. He is not diaphoretic.  Eyes:     General: No scleral icterus.  Conjunctiva/sclera: Conjunctivae normal.  Neck:     Thyroid: No thyromegaly.  Cardiovascular:     Rate and Rhythm: Normal rate and regular rhythm.     Heart sounds: Normal heart sounds. No murmur heard. Pulmonary:     Effort: Pulmonary effort is normal. No respiratory distress.     Breath sounds: Normal breath sounds. No wheezing.  Musculoskeletal:        General: No swelling. Normal range of motion.     Cervical back: Neck supple.  Lymphadenopathy:     Cervical: No cervical adenopathy.  Skin:    General: Skin is warm and dry.     Findings: No rash.  Neurological:     Mental Status: He is alert and oriented to person, place, and time.     Coordination: Coordination normal.  Psychiatric:        Behavior: Behavior  normal.       Assessment & Plan:   Problem List Items Addressed This Visit       Cardiovascular and Mediastinum   Hypertension - Primary   Relevant Orders   CBC with Differential/Platelet   CMP14+EGFR   Lipid panel   CAD (coronary artery disease)   Relevant Orders   CBC with Differential/Platelet   CMP14+EGFR   Lipid panel     Other   Mixed hyperlipidemia   Relevant Orders   CBC with Differential/Platelet   CMP14+EGFR   Lipid panel    Continue current medicine, seems to be doing well.  Continue to follow with cardiology as well.  Will check blood work today.  No changes Follow up plan: Return in about 6 months (around 12/01/2023), or if symptoms worsen or fail to improve, for Physical exam and hypertension and cholesterol.  Counseling provided for all of the vaccine components Orders Placed This Encounter  Procedures   CBC with Differential/Platelet   CMP14+EGFR   Lipid panel    Jolyne Needs, MD [facility] 06/01/2023, 9:31 AM

## 2023-06-02 LAB — CBC WITH DIFFERENTIAL/PLATELET
Basophils Absolute: 0 10*3/uL (ref 0.0–0.2)
Basos: 1 %
EOS (ABSOLUTE): 0.3 10*3/uL (ref 0.0–0.4)
Eos: 7 %
Hematocrit: 46.8 % (ref 37.5–51.0)
Hemoglobin: 16.3 g/dL (ref 13.0–17.7)
Immature Grans (Abs): 0 10*3/uL (ref 0.0–0.1)
Immature Granulocytes: 0 %
Lymphocytes Absolute: 1.3 10*3/uL (ref 0.7–3.1)
Lymphs: 28 %
MCH: 31.6 pg (ref 26.6–33.0)
MCHC: 34.8 g/dL (ref 31.5–35.7)
MCV: 91 fL (ref 79–97)
Monocytes Absolute: 0.4 10*3/uL (ref 0.1–0.9)
Monocytes: 9 %
Neutrophils Absolute: 2.6 10*3/uL (ref 1.4–7.0)
Neutrophils: 55 %
Platelets: 160 10*3/uL (ref 150–450)
RBC: 5.16 x10E6/uL (ref 4.14–5.80)
RDW: 12.9 % (ref 11.6–15.4)
WBC: 4.7 10*3/uL (ref 3.4–10.8)

## 2023-06-02 LAB — CMP14+EGFR
ALT: 28 IU/L (ref 0–44)
AST: 24 IU/L (ref 0–40)
Albumin: 3.9 g/dL (ref 3.9–4.9)
Alkaline Phosphatase: 40 IU/L — ABNORMAL LOW (ref 44–121)
BUN/Creatinine Ratio: 13 (ref 10–24)
BUN: 15 mg/dL (ref 8–27)
Bilirubin Total: 0.8 mg/dL (ref 0.0–1.2)
CO2: 23 mmol/L (ref 20–29)
Calcium: 9.1 mg/dL (ref 8.6–10.2)
Chloride: 104 mmol/L (ref 96–106)
Creatinine, Ser: 1.13 mg/dL (ref 0.76–1.27)
Globulin, Total: 2.2 g/dL (ref 1.5–4.5)
Glucose: 115 mg/dL — ABNORMAL HIGH (ref 70–99)
Potassium: 3.9 mmol/L (ref 3.5–5.2)
Sodium: 141 mmol/L (ref 134–144)
Total Protein: 6.1 g/dL (ref 6.0–8.5)
eGFR: 72 mL/min/{1.73_m2} (ref >59)

## 2023-06-02 LAB — LIPID PANEL
Cholesterol, Total: 122 mg/dL (ref 100–199)
HDL: 50 mg/dL (ref >39)
LDL CALC COMMENT:: 2.4 ratio (ref 0.0–5.0)
LDL Chol Calc (NIH): 58 mg/dL (ref 0–99)
Triglycerides: 66 mg/dL (ref 0–149)
VLDL Cholesterol Cal: 14 mg/dL (ref 5–40)

## 2023-06-10 ENCOUNTER — Encounter: Payer: Self-pay | Admitting: Family Medicine

## 2023-07-19 ENCOUNTER — Other Ambulatory Visit: Payer: Self-pay | Admitting: Physician Assistant

## 2023-08-13 ENCOUNTER — Other Ambulatory Visit: Payer: Self-pay | Admitting: Family Medicine

## 2023-08-13 DIAGNOSIS — N401 Enlarged prostate with lower urinary tract symptoms: Secondary | ICD-10-CM

## 2023-08-18 ENCOUNTER — Encounter: Payer: Self-pay | Admitting: Cardiology

## 2023-08-18 ENCOUNTER — Ambulatory Visit: Attending: Cardiology | Admitting: Cardiology

## 2023-08-18 VITALS — BP 130/88 | HR 49 | Ht 66.0 in | Wt 201.0 lb

## 2023-08-18 DIAGNOSIS — Z953 Presence of xenogenic heart valve: Secondary | ICD-10-CM

## 2023-08-18 DIAGNOSIS — I7121 Aneurysm of the ascending aorta, without rupture: Secondary | ICD-10-CM

## 2023-08-18 DIAGNOSIS — Z952 Presence of prosthetic heart valve: Secondary | ICD-10-CM

## 2023-08-18 DIAGNOSIS — I10 Essential (primary) hypertension: Secondary | ICD-10-CM | POA: Diagnosis not present

## 2023-08-18 DIAGNOSIS — I77810 Thoracic aortic ectasia: Secondary | ICD-10-CM

## 2023-08-18 MED ORDER — METOPROLOL SUCCINATE ER 25 MG PO TB24: 12.5000 mg | ORAL_TABLET | Freq: Every day | ORAL | 3 refills | Status: AC

## 2023-08-18 NOTE — Patient Instructions (Signed)
 Medication Instructions:   DECREASE Toprol  to 12. 5 mg daily  Labwork:  BMET just before chest CT in December  Testing/Procedures:  Chest CT angio in December   Follow-Up: 1 year  Any Other Special Instructions Will Be Listed Below (If Applicable).  If you need a refill on your cardiac medications before your next appointment, please call your pharmacy.

## 2023-08-18 NOTE — Progress Notes (Signed)
    Cardiology Office Note  Date: 08/18/2023   ID: Justin [name], DOB [birth year], MRN [id number]  History of Present Illness: Justin [name] is a 68 y.o. male last seen in January 2024 by Ms. Ingold NP, I reviewed her note (our last encounter was in 2022).  He is here for a routine visit.  He reports no deterioration in stamina, no exertional chest pain, no increasing shortness of breath with activity.  No palpitations or syncope.  We went over his medications.  He reports compliance with current regimen.  We discussed reducing his Toprol -XL dose given degree of resting bradycardia.  I reviewed his ECG today which shows sinus bradycardia at 46 bpm with increased voltage and repolarization abnormalities.  He had follow-up imaging including echocardiogram and chest CTA in December 2024.  We discussed the results.  Physical Exam: VS:  BP 130/88 (BP Location: Left Arm, Cuff Size: Normal)   Pulse (!) 49   Ht 5' 6 (1.676 m)   Wt 201 lb (91.2 kg)   SpO2 97%   BMI 32.44 kg/m , BMI Body mass index is 32.44 kg/m.  Wt Readings from Last 3 Encounters:  08/18/23 201 lb (91.2 kg)  06/01/23 205 lb (93 kg)  03/24/23 208 lb (94.3 kg)    General: Patient appears comfortable at rest. HEENT: Conjunctiva and lids normal. Neck: Supple, no elevated JVP or carotid bruits. Lungs: Clear to auscultation, nonlabored breathing at rest. Cardiac: Regular rate and rhythm, no S3, 2/6 systolic murmur, no pericardial rub. Extremities: No pitting edema.  ECG:  An ECG dated 03/03/2022 was personally reviewed today and demonstrated:  Sinus rhythm with PAC, nonspecific T wave changes.  Labwork: 06/01/2023: ALT 28; AST 24; BUN 15; Creatinine, Ser 1.13; Hemoglobin 16.3; Platelets 160; Potassium 3.9; Sodium 141     Component Value Date/Time   CHOL 122 06/01/2023 0937   TRIG 66 06/01/2023 0937   HDL 50 06/01/2023 0937   CHOLHDL 2.4 06/01/2023 0937   CHOLHDL 2.6 04/17/2018 0855   LDLCALC 58 06/01/2023 0937    LDLCALC 62 04/17/2018 0855   Other Studies Reviewed Today:  No interval cardiac testing for review today.  Assessment and Plan:  1.  History of bicuspid aortic valve with cusp prolapse and severe aortic regurgitation status post AVR with 27 mm Edwards bioprosthesis in 2013.  Echocardiogram in December 2024 revealed LVEF 55 to 60% with mean AV gradient 8 mmHg and no aortic regurgitation.  No significant change in murmur.  Continue aspirin  81 mg daily.  2.  Primary hypertension.  No changes made to current regimen other than reduction in Toprol -XL to 12.5 mg daily due to degree of resting bradycardia.  Continue chlorthalidone  25 mg daily with KCl 10 mEq daily and keep follow-up with PCP.  3.  Ascending aortic dilatation, 4.2 cm by chest CTA in December 2024.  He is asymptomatic.  Follow-up chest CTA in December of this year.  Disposition:  Follow up 1 year.  Signed, Jayson JUDITHANN Sierras, M.D., F.A.C.C. [facility]

## 2023-08-20 ENCOUNTER — Other Ambulatory Visit: Payer: Self-pay | Admitting: Family Medicine

## 2023-08-20 DIAGNOSIS — I10 Essential (primary) hypertension: Secondary | ICD-10-CM

## 2023-11-30 ENCOUNTER — Encounter: Payer: Self-pay | Admitting: Family Medicine

## 2023-11-30 ENCOUNTER — Ambulatory Visit: Admitting: Family Medicine

## 2023-11-30 VITALS — BP 126/84 | HR 59 | Ht 66.0 in | Wt 202.0 lb

## 2023-11-30 DIAGNOSIS — N401 Enlarged prostate with lower urinary tract symptoms: Secondary | ICD-10-CM

## 2023-11-30 DIAGNOSIS — I2510 Atherosclerotic heart disease of native coronary artery without angina pectoris: Secondary | ICD-10-CM

## 2023-11-30 DIAGNOSIS — Z23 Encounter for immunization: Secondary | ICD-10-CM

## 2023-11-30 DIAGNOSIS — R351 Nocturia: Secondary | ICD-10-CM

## 2023-11-30 DIAGNOSIS — E782 Mixed hyperlipidemia: Secondary | ICD-10-CM

## 2023-11-30 DIAGNOSIS — I10 Essential (primary) hypertension: Secondary | ICD-10-CM | POA: Diagnosis not present

## 2023-11-30 DIAGNOSIS — Z0001 Encounter for general adult medical examination with abnormal findings: Secondary | ICD-10-CM | POA: Diagnosis not present

## 2023-11-30 MED ORDER — POTASSIUM CHLORIDE CRYS ER 10 MEQ PO TBCR: 10.0000 meq | EXTENDED_RELEASE_TABLET | Freq: Every day | ORAL | 3 refills | Status: AC

## 2023-11-30 MED ORDER — CHLORTHALIDONE 25 MG PO TABS: 25.0000 mg | ORAL_TABLET | Freq: Every day | ORAL | 3 refills | Status: AC

## 2023-11-30 NOTE — Progress Notes (Signed)
 BP 126/84   Pulse (!) 59   Ht 5' 6 (1.676 m)   Wt 202 lb (91.6 kg)   SpO2 96%   BMI 32.60 kg/m    Subjective:   Patient ID: Justin [name], male    DOB: [birth year], 68 y.o.   MRN: [id number]  HPI: Justin [name] is a 68 y.o. male presenting on 11/30/2023 for Medical Management of Chronic Issues, Hypertension, and Hyperlipidemia   Discussed the use of AI scribe software for clinical note transcription with the patient, who gave verbal consent to proceed.  History of Present Illness   Justin [name] is a 68 year old male with hypertension and benign prostatic hyperplasia who presents for an annual physical exam and recheck of his conditions.  Hypertension and lipid management - Recent blood pressure reading 126/84 mmHg - Currently taking Lipitor for cholesterol management without adverse effects  Lower urinary tract symptoms - Taking tamsulosin  (Flomax ) for benign prostatic hyperplasia with minimal symptom relief - Nocturia, waking three to four times per night - Sensation of incomplete bladder emptying - Urinary stream described as slightly below normal - No hematuria  Cardiac history and symptoms - History of heart valve replacement - Previously noted slight heart murmur - Heart rate previously low, resulting in medication adjustment by cardiologist - Current heart rate 59-60 bpm - No symptoms of lightheadedness or dizziness          Relevant past medical, surgical, family and social history reviewed and updated as indicated. Interim medical history since our last visit reviewed. Allergies and medications reviewed and updated.  Review of Systems  Constitutional:  Negative for chills and fever.  Eyes:  Negative for discharge.  Respiratory:  Negative for shortness of breath and wheezing.   Cardiovascular:  Negative for chest pain and leg swelling.  Genitourinary:  Positive for frequency. Negative for difficulty urinating, dysuria and hematuria.  Skin:  Negative  for rash.  All other systems reviewed and are negative.   Per HPI unless specifically indicated above   Allergies as of 11/30/2023   No Known Allergies      Medication List        Accurate as of November 30, 2023 10:14 AM. If you have any questions, ask your nurse or doctor.          aspirin  81 MG tablet Take 81 mg by mouth daily.   atorvastatin  20 MG tablet Commonly known as: LIPITOR TAKE ONE TABLET BY MOUTH EVERY DAY   CENTRUM SILVER PO Take 1 tablet by mouth daily.   chlorthalidone  25 MG tablet Commonly known as: HYGROTON  Take 1 tablet (25 mg total) by mouth daily.   FISH OIL CONCENTRATE PO Take 1,200 mg by mouth daily.   metoprolol  succinate 25 MG 24 hr tablet Commonly known as: Toprol  XL Take 0.5 tablets (12.5 mg total) by mouth daily.   potassium chloride  10 MEQ tablet Commonly known as: KLOR-CON  M Take 1 tablet (10 mEq total) by mouth daily.   sildenafil  100 MG tablet Commonly known as: VIAGRA  TAKE 1/2 TO ONE TABLET BY MOUTH DAILY ASNEEDED   tamsulosin  0.4 MG Caps capsule Commonly known as: FLOMAX  TAKE 1 CAPSULE BY MOUTH EVERY DAY         Objective:   BP 126/84   Pulse (!) 59   Ht 5' 6 (1.676 m)   Wt 202 lb (91.6 kg)   SpO2 96%   BMI 32.60 kg/m   Wt Readings from Last 3 Encounters:  11/30/23 202 lb (91.6 kg)  08/18/23 201 lb (91.2 kg)  06/01/23 205 lb (93 kg)    Physical Exam Physical Exam   VITALS: P- 60, BP- 126/84 HEENT: Ears normal, external auditory canals clear. CHEST: Lungs clear to auscultation bilaterally. CARDIOVASCULAR: Small murmur likely from valve replacement.  Systolic murmur best heard at right second intercostal space, grade 2 out of 6. ABDOMEN: Abdomen non-tender.         Assessment & Plan:   Problem List Items Addressed This Visit       Cardiovascular and Mediastinum   Hypertension   Relevant Medications   chlorthalidone  (HYGROTON ) 25 MG tablet   potassium chloride  (KLOR-CON  M) 10 MEQ tablet    Other Relevant Orders   CBC with Differential/Platelet   CMP14+EGFR   Lipid panel   TSH   CAD (coronary artery disease)   Relevant Medications   chlorthalidone  (HYGROTON ) 25 MG tablet   Other Relevant Orders   CBC with Differential/Platelet   TSH     Other   Mixed hyperlipidemia   Relevant Medications   chlorthalidone  (HYGROTON ) 25 MG tablet   Other Relevant Orders   CBC with Differential/Platelet   CMP14+EGFR   Lipid panel   TSH   Other Visit Diagnoses       Encounter for immunization    -  Primary   Relevant Orders   Flu vaccine HIGH DOSE PF(Fluzone Trivalent) (Completed)     Essential hypertension       Relevant Medications   chlorthalidone  (HYGROTON ) 25 MG tablet   potassium chloride  (KLOR-CON  M) 10 MEQ tablet   Other Relevant Orders   TSH     Encounter for immunization       Relevant Orders   Pneumococcal conjugate vaccine 20-valent (Completed)     BPH associated with nocturia       Relevant Orders   PSA, total and free          Benign prostatic hyperplasia with lower urinary tract symptoms Experiencing nocturia, incomplete bladder emptying, and weak urinary stream. Tamsulosin  ineffective. Discussed finasteride and urology referral. - Continue tamsulosin . - Consider stopping tamsulosin  for 2 months to assess symptom changes. - Consider urology referral for procedural options if symptoms persist.  Atherosclerotic heart disease of native coronary artery Post heart valve replacement with stable heart rate. No symptoms of lightheadedness or dizziness. Recent medication adjustment by cardiology. - Monitor for symptoms of lightheadedness or dizziness.  Status post heart valve replacement Slight heart murmur likely related to valve replacement.  Essential hypertension Blood pressure well-controlled.  Mixed hyperlipidemia On Lipitor with no issues. - Continue current management.  Adult Wellness Visit Routine visit. Blood pressure, heart rate, and oxygen  levels normal. Received flu and pneumonia vaccinations. - Continue lifestyle modifications including increased physical activity, consumption of fruits and vegetables, and sodium restriction.          Follow up plan: Return in about 6 months (around 05/30/2024), or if symptoms worsen or fail to improve, for Recheck cholesterol and hypertension.  Counseling provided for all of the vaccine components Orders Placed This Encounter  Procedures   Flu vaccine HIGH DOSE PF(Fluzone Trivalent)   Pneumococcal conjugate vaccine 20-valent   CBC with Differential/Platelet   CMP14+EGFR   Lipid panel   TSH   PSA, total and free    Fonda Levins, MD [facility] 11/30/2023, 10:14 AM

## 2023-12-01 LAB — CMP14+EGFR
ALT: 30 IU/L (ref 0–44)
AST: 27 IU/L (ref 0–40)
Albumin: 4.2 g/dL (ref 3.9–4.9)
Alkaline Phosphatase: 40 IU/L — ABNORMAL LOW (ref 47–123)
BUN/Creatinine Ratio: 17 (ref 10–24)
BUN: 16 mg/dL (ref 8–27)
Bilirubin Total: 0.8 mg/dL (ref 0.0–1.2)
CO2: 23 mmol/L (ref 20–29)
Calcium: 9.5 mg/dL (ref 8.6–10.2)
Chloride: 103 mmol/L (ref 96–106)
Creatinine, Ser: 0.96 mg/dL (ref 0.76–1.27)
Globulin, Total: 2.2 g/dL (ref 1.5–4.5)
Glucose: 122 mg/dL — ABNORMAL HIGH (ref 70–99)
Potassium: 3.6 mmol/L (ref 3.5–5.2)
Sodium: 139 mmol/L (ref 134–144)
Total Protein: 6.4 g/dL (ref 6.0–8.5)
eGFR: 87 mL/min/1.73 (ref >59)

## 2023-12-01 LAB — CBC WITH DIFFERENTIAL/PLATELET
Basophils Absolute: 0.1 x10E3/uL (ref 0.0–0.2)
Basos: 1 %
EOS (ABSOLUTE): 0.3 x10E3/uL (ref 0.0–0.4)
Eos: 6 %
Hematocrit: 48.8 % (ref 37.5–51.0)
Hemoglobin: 16.8 g/dL (ref 13.0–17.7)
Immature Grans (Abs): 0 x10E3/uL (ref 0.0–0.1)
Immature Granulocytes: 0 %
Lymphocytes Absolute: 1.3 x10E3/uL (ref 0.7–3.1)
Lymphs: 27 %
MCH: 32.1 pg (ref 26.6–33.0)
MCHC: 34.4 g/dL (ref 31.5–35.7)
MCV: 93 fL (ref 79–97)
Monocytes Absolute: 0.4 x10E3/uL (ref 0.1–0.9)
Monocytes: 8 %
Neutrophils Absolute: 2.7 x10E3/uL (ref 1.4–7.0)
Neutrophils: 58 %
Platelets: 185 x10E3/uL (ref 150–450)
RBC: 5.23 x10E6/uL (ref 4.14–5.80)
RDW: 13.1 % (ref 11.6–15.4)
WBC: 4.7 x10E3/uL (ref 3.4–10.8)

## 2023-12-01 LAB — LIPID PANEL
Chol/HDL Ratio: 3.7 ratio (ref 0.0–5.0)
Cholesterol, Total: 191 mg/dL (ref 100–199)
HDL: 52 mg/dL (ref >39)
LDL Chol Calc (NIH): 121 mg/dL — ABNORMAL HIGH (ref 0–99)
Triglycerides: 99 mg/dL (ref 0–149)
VLDL Cholesterol Cal: 18 mg/dL (ref 5–40)

## 2023-12-01 LAB — TSH: TSH: 0.911 u[IU]/mL (ref 0.450–4.500)

## 2023-12-01 LAB — PSA, TOTAL AND FREE
PSA, Free Pct: 31.1 %
PSA, Free: 0.28 ng/mL
Prostate Specific Ag, Serum: 0.9 ng/mL (ref 0.0–4.0)

## 2023-12-05 ENCOUNTER — Ambulatory Visit: Payer: Self-pay | Admitting: Family Medicine

## 2023-12-23 LAB — BASIC METABOLIC PANEL WITH GFR
BUN/Creatinine Ratio: 14 (ref 10–24)
BUN: 15 mg/dL (ref 8–27)
CO2: 26 mmol/L (ref 20–29)
Calcium: 9.6 mg/dL (ref 8.6–10.2)
Chloride: 100 mmol/L (ref 96–106)
Creatinine, Ser: 1.04 mg/dL (ref 0.76–1.27)
Glucose: 111 mg/dL — ABNORMAL HIGH (ref 70–99)
Potassium: 3.9 mmol/L (ref 3.5–5.2)
Sodium: 139 mmol/L (ref 134–144)
eGFR: 79 mL/min/1.73 (ref >59)

## 2024-01-18 ENCOUNTER — Ambulatory Visit (HOSPITAL_COMMUNITY): Admission: RE | Admit: 2024-01-18 | Discharge: 2024-01-18 | Attending: Cardiology | Admitting: Cardiology

## 2024-01-18 DIAGNOSIS — I77810 Thoracic aortic ectasia: Secondary | ICD-10-CM | POA: Diagnosis present

## 2024-01-18 MED ORDER — IOHEXOL 350 MG/ML SOLN
75.0000 mL | Freq: Once | INTRAVENOUS | Status: AC | PRN
  Administered 2024-01-18: 75 mL via INTRAVENOUS

## 2024-01-27 ENCOUNTER — Ambulatory Visit: Payer: Self-pay | Admitting: Cardiology

## 2024-01-27 NOTE — Telephone Encounter (Signed)
 Patient returned RN's call.

## 2024-05-30 ENCOUNTER — Ambulatory Visit: Payer: Self-pay | Admitting: Family Medicine

## 2024-11-30 ENCOUNTER — Encounter: Payer: Self-pay | Admitting: Family Medicine
